# Patient Record
Sex: Male | Born: 1970 | Race: White | Hispanic: No | Smoking: Current every day smoker
Health system: Southern US, Community
[De-identification: ages and names within clinical notes are randomized; demographics above are authoritative.]

## PROBLEM LIST (undated history)

## (undated) DIAGNOSIS — I10 Essential (primary) hypertension: Secondary | ICD-10-CM

## (undated) DIAGNOSIS — F329 Major depressive disorder, single episode, unspecified: Secondary | ICD-10-CM

## (undated) DIAGNOSIS — R51 Headache: Secondary | ICD-10-CM

## (undated) DIAGNOSIS — F319 Bipolar disorder, unspecified: Secondary | ICD-10-CM

## (undated) DIAGNOSIS — J45909 Unspecified asthma, uncomplicated: Secondary | ICD-10-CM

## (undated) DIAGNOSIS — K219 Gastro-esophageal reflux disease without esophagitis: Secondary | ICD-10-CM

## (undated) DIAGNOSIS — G8929 Other chronic pain: Secondary | ICD-10-CM

## (undated) DIAGNOSIS — F419 Anxiety disorder, unspecified: Secondary | ICD-10-CM

## (undated) DIAGNOSIS — N179 Acute kidney failure, unspecified: Secondary | ICD-10-CM

## (undated) DIAGNOSIS — R519 Headache, unspecified: Secondary | ICD-10-CM

## (undated) DIAGNOSIS — M549 Dorsalgia, unspecified: Secondary | ICD-10-CM

## (undated) DIAGNOSIS — N200 Calculus of kidney: Secondary | ICD-10-CM

## (undated) DIAGNOSIS — R569 Unspecified convulsions: Secondary | ICD-10-CM

## (undated) DIAGNOSIS — F32A Depression, unspecified: Secondary | ICD-10-CM

## (undated) DIAGNOSIS — J449 Chronic obstructive pulmonary disease, unspecified: Secondary | ICD-10-CM

## (undated) DIAGNOSIS — J189 Pneumonia, unspecified organism: Secondary | ICD-10-CM

## (undated) HISTORY — PX: ANKLE SURGERY: SHX546

## (undated) HISTORY — PX: APPENDECTOMY: SHX54

## (undated) HISTORY — PX: BACK SURGERY: SHX140

---

## 1998-03-23 ENCOUNTER — Emergency Department (HOSPITAL_COMMUNITY): Admission: EM | Admit: 1998-03-23 | Discharge: 1998-03-23 | Payer: Self-pay | Admitting: Emergency Medicine

## 1998-11-27 ENCOUNTER — Emergency Department (HOSPITAL_COMMUNITY): Admission: EM | Admit: 1998-11-27 | Discharge: 1998-11-27 | Payer: Self-pay | Admitting: Internal Medicine

## 1998-11-28 ENCOUNTER — Encounter: Payer: Self-pay | Admitting: Emergency Medicine

## 1998-11-29 ENCOUNTER — Inpatient Hospital Stay (HOSPITAL_COMMUNITY): Admission: EM | Admit: 1998-11-29 | Discharge: 1998-12-02 | Payer: Self-pay | Admitting: Emergency Medicine

## 1998-11-30 ENCOUNTER — Encounter: Payer: Self-pay | Admitting: Family Medicine

## 1998-12-01 ENCOUNTER — Encounter: Payer: Self-pay | Admitting: Gastroenterology

## 1998-12-03 ENCOUNTER — Inpatient Hospital Stay (HOSPITAL_COMMUNITY): Admission: EM | Admit: 1998-12-03 | Discharge: 1998-12-06 | Payer: Self-pay | Admitting: Emergency Medicine

## 1998-12-04 ENCOUNTER — Encounter: Payer: Self-pay | Admitting: Family Medicine

## 1999-04-13 ENCOUNTER — Emergency Department (HOSPITAL_COMMUNITY): Admission: EM | Admit: 1999-04-13 | Discharge: 1999-04-13 | Payer: Self-pay | Admitting: Emergency Medicine

## 1999-06-13 ENCOUNTER — Emergency Department (HOSPITAL_COMMUNITY): Admission: EM | Admit: 1999-06-13 | Discharge: 1999-06-13 | Payer: Self-pay | Admitting: Emergency Medicine

## 1999-08-21 ENCOUNTER — Encounter: Payer: Self-pay | Admitting: Emergency Medicine

## 1999-08-21 ENCOUNTER — Emergency Department (HOSPITAL_COMMUNITY): Admission: EM | Admit: 1999-08-21 | Discharge: 1999-08-22 | Payer: Self-pay | Admitting: Emergency Medicine

## 1999-09-15 ENCOUNTER — Emergency Department (HOSPITAL_COMMUNITY): Admission: EM | Admit: 1999-09-15 | Discharge: 1999-09-15 | Payer: Self-pay | Admitting: Emergency Medicine

## 1999-10-19 ENCOUNTER — Emergency Department (HOSPITAL_COMMUNITY): Admission: EM | Admit: 1999-10-19 | Discharge: 1999-10-19 | Payer: Self-pay | Admitting: Emergency Medicine

## 1999-10-24 ENCOUNTER — Encounter: Admission: RE | Admit: 1999-10-24 | Discharge: 1999-10-24 | Payer: Self-pay | Admitting: Internal Medicine

## 1999-10-24 ENCOUNTER — Encounter: Payer: Self-pay | Admitting: Internal Medicine

## 1999-10-27 ENCOUNTER — Encounter: Payer: Self-pay | Admitting: Internal Medicine

## 1999-10-27 ENCOUNTER — Encounter: Admission: RE | Admit: 1999-10-27 | Discharge: 1999-10-27 | Payer: Self-pay | Admitting: Internal Medicine

## 1999-11-21 ENCOUNTER — Observation Stay (HOSPITAL_COMMUNITY): Admission: RE | Admit: 1999-11-21 | Discharge: 1999-11-21 | Payer: Self-pay | Admitting: Neurosurgery

## 1999-11-21 ENCOUNTER — Encounter: Payer: Self-pay | Admitting: Neurosurgery

## 1999-11-25 ENCOUNTER — Emergency Department (HOSPITAL_COMMUNITY): Admission: EM | Admit: 1999-11-25 | Discharge: 1999-11-26 | Payer: Self-pay | Admitting: Emergency Medicine

## 1999-11-25 ENCOUNTER — Encounter: Payer: Self-pay | Admitting: Emergency Medicine

## 2000-01-01 ENCOUNTER — Inpatient Hospital Stay (HOSPITAL_COMMUNITY): Admission: EM | Admit: 2000-01-01 | Discharge: 2000-01-03 | Payer: Self-pay | Admitting: *Deleted

## 2000-02-27 ENCOUNTER — Emergency Department (HOSPITAL_COMMUNITY): Admission: EM | Admit: 2000-02-27 | Discharge: 2000-02-27 | Payer: Self-pay | Admitting: Emergency Medicine

## 2000-05-06 ENCOUNTER — Emergency Department (HOSPITAL_COMMUNITY): Admission: EM | Admit: 2000-05-06 | Discharge: 2000-05-06 | Payer: Self-pay | Admitting: *Deleted

## 2000-05-28 ENCOUNTER — Emergency Department (HOSPITAL_COMMUNITY): Admission: EM | Admit: 2000-05-28 | Discharge: 2000-05-28 | Payer: Self-pay | Admitting: Emergency Medicine

## 2000-06-13 ENCOUNTER — Inpatient Hospital Stay (HOSPITAL_COMMUNITY): Admission: EM | Admit: 2000-06-13 | Discharge: 2000-06-15 | Payer: Self-pay | Admitting: Emergency Medicine

## 2000-09-01 ENCOUNTER — Emergency Department (HOSPITAL_COMMUNITY): Admission: EM | Admit: 2000-09-01 | Discharge: 2000-09-01 | Payer: Self-pay | Admitting: Emergency Medicine

## 2000-09-03 ENCOUNTER — Emergency Department (HOSPITAL_COMMUNITY): Admission: EM | Admit: 2000-09-03 | Discharge: 2000-09-03 | Payer: Self-pay | Admitting: Emergency Medicine

## 2000-09-25 ENCOUNTER — Ambulatory Visit (HOSPITAL_COMMUNITY): Admission: RE | Admit: 2000-09-25 | Discharge: 2000-09-25 | Payer: Self-pay | Admitting: *Deleted

## 2000-12-16 ENCOUNTER — Emergency Department (HOSPITAL_COMMUNITY): Admission: EM | Admit: 2000-12-16 | Discharge: 2000-12-16 | Payer: Self-pay | Admitting: Emergency Medicine

## 2000-12-29 ENCOUNTER — Emergency Department (HOSPITAL_COMMUNITY): Admission: EM | Admit: 2000-12-29 | Discharge: 2000-12-29 | Payer: Self-pay | Admitting: Emergency Medicine

## 2001-01-30 ENCOUNTER — Emergency Department (HOSPITAL_COMMUNITY): Admission: EM | Admit: 2001-01-30 | Discharge: 2001-01-30 | Payer: Self-pay | Admitting: Emergency Medicine

## 2001-01-30 ENCOUNTER — Encounter: Payer: Self-pay | Admitting: Emergency Medicine

## 2001-03-17 ENCOUNTER — Emergency Department (HOSPITAL_COMMUNITY): Admission: EM | Admit: 2001-03-17 | Discharge: 2001-03-17 | Payer: Self-pay | Admitting: Emergency Medicine

## 2001-03-17 ENCOUNTER — Encounter: Payer: Self-pay | Admitting: Emergency Medicine

## 2001-04-15 ENCOUNTER — Emergency Department (HOSPITAL_COMMUNITY): Admission: EM | Admit: 2001-04-15 | Discharge: 2001-04-15 | Payer: Self-pay | Admitting: Emergency Medicine

## 2001-08-10 ENCOUNTER — Emergency Department (HOSPITAL_COMMUNITY): Admission: EM | Admit: 2001-08-10 | Discharge: 2001-08-10 | Payer: Self-pay | Admitting: Emergency Medicine

## 2001-09-06 ENCOUNTER — Emergency Department (HOSPITAL_COMMUNITY): Admission: EM | Admit: 2001-09-06 | Discharge: 2001-09-06 | Payer: Self-pay | Admitting: Emergency Medicine

## 2001-09-07 ENCOUNTER — Encounter: Payer: Self-pay | Admitting: Emergency Medicine

## 2001-09-24 ENCOUNTER — Emergency Department (HOSPITAL_COMMUNITY): Admission: EM | Admit: 2001-09-24 | Discharge: 2001-09-24 | Payer: Self-pay | Admitting: Emergency Medicine

## 2001-11-04 ENCOUNTER — Emergency Department (HOSPITAL_COMMUNITY): Admission: EM | Admit: 2001-11-04 | Discharge: 2001-11-04 | Payer: Self-pay | Admitting: Emergency Medicine

## 2002-02-01 ENCOUNTER — Emergency Department (HOSPITAL_COMMUNITY): Admission: EM | Admit: 2002-02-01 | Discharge: 2002-02-01 | Payer: Self-pay | Admitting: Emergency Medicine

## 2002-05-05 ENCOUNTER — Emergency Department (HOSPITAL_COMMUNITY): Admission: EM | Admit: 2002-05-05 | Discharge: 2002-05-05 | Payer: Self-pay | Admitting: Emergency Medicine

## 2002-05-15 ENCOUNTER — Encounter: Payer: Self-pay | Admitting: Emergency Medicine

## 2002-05-15 ENCOUNTER — Emergency Department (HOSPITAL_COMMUNITY): Admission: EM | Admit: 2002-05-15 | Discharge: 2002-05-16 | Payer: Self-pay | Admitting: Emergency Medicine

## 2002-06-04 ENCOUNTER — Emergency Department (HOSPITAL_COMMUNITY): Admission: EM | Admit: 2002-06-04 | Discharge: 2002-06-04 | Payer: Self-pay

## 2002-06-22 ENCOUNTER — Encounter: Payer: Self-pay | Admitting: Neurosurgery

## 2002-06-22 ENCOUNTER — Encounter: Admission: RE | Admit: 2002-06-22 | Discharge: 2002-06-22 | Payer: Self-pay | Admitting: Neurosurgery

## 2002-06-28 ENCOUNTER — Emergency Department (HOSPITAL_COMMUNITY): Admission: EM | Admit: 2002-06-28 | Discharge: 2002-06-29 | Payer: Self-pay | Admitting: Emergency Medicine

## 2002-07-07 ENCOUNTER — Encounter: Admission: RE | Admit: 2002-07-07 | Discharge: 2002-07-07 | Payer: Self-pay | Admitting: Neurosurgery

## 2002-07-07 ENCOUNTER — Encounter: Payer: Self-pay | Admitting: Neurosurgery

## 2002-07-17 ENCOUNTER — Emergency Department (HOSPITAL_COMMUNITY): Admission: EM | Admit: 2002-07-17 | Discharge: 2002-07-17 | Payer: Self-pay | Admitting: Emergency Medicine

## 2002-09-29 ENCOUNTER — Encounter: Admission: RE | Admit: 2002-09-29 | Discharge: 2002-12-28 | Payer: Self-pay

## 2002-11-01 ENCOUNTER — Encounter: Payer: Self-pay | Admitting: Emergency Medicine

## 2002-11-01 ENCOUNTER — Emergency Department (HOSPITAL_COMMUNITY): Admission: EM | Admit: 2002-11-01 | Discharge: 2002-11-01 | Payer: Self-pay | Admitting: Emergency Medicine

## 2002-11-30 ENCOUNTER — Encounter: Admission: RE | Admit: 2002-11-30 | Discharge: 2002-11-30 | Payer: Self-pay | Admitting: Neurology

## 2002-11-30 ENCOUNTER — Encounter: Payer: Self-pay | Admitting: Neurology

## 2002-12-07 ENCOUNTER — Encounter: Payer: Self-pay | Admitting: Emergency Medicine

## 2002-12-07 ENCOUNTER — Emergency Department (HOSPITAL_COMMUNITY): Admission: EM | Admit: 2002-12-07 | Discharge: 2002-12-07 | Payer: Self-pay

## 2003-01-18 ENCOUNTER — Emergency Department (HOSPITAL_COMMUNITY): Admission: EM | Admit: 2003-01-18 | Discharge: 2003-01-18 | Payer: Self-pay | Admitting: Emergency Medicine

## 2003-01-19 ENCOUNTER — Emergency Department (HOSPITAL_COMMUNITY): Admission: EM | Admit: 2003-01-19 | Discharge: 2003-01-19 | Payer: Self-pay | Admitting: Emergency Medicine

## 2003-01-20 ENCOUNTER — Emergency Department (HOSPITAL_COMMUNITY): Admission: EM | Admit: 2003-01-20 | Discharge: 2003-01-20 | Payer: Self-pay | Admitting: Emergency Medicine

## 2003-02-08 ENCOUNTER — Ambulatory Visit (HOSPITAL_COMMUNITY): Admission: RE | Admit: 2003-02-08 | Discharge: 2003-02-08 | Payer: Self-pay | Admitting: Neurology

## 2003-02-08 ENCOUNTER — Encounter: Payer: Self-pay | Admitting: Neurology

## 2003-02-16 ENCOUNTER — Emergency Department (HOSPITAL_COMMUNITY): Admission: EM | Admit: 2003-02-16 | Discharge: 2003-02-16 | Payer: Self-pay | Admitting: Emergency Medicine

## 2003-03-15 ENCOUNTER — Encounter: Payer: Self-pay | Admitting: Emergency Medicine

## 2003-03-15 ENCOUNTER — Emergency Department (HOSPITAL_COMMUNITY): Admission: EM | Admit: 2003-03-15 | Discharge: 2003-03-15 | Payer: Self-pay | Admitting: Emergency Medicine

## 2003-04-11 ENCOUNTER — Emergency Department (HOSPITAL_COMMUNITY): Admission: AD | Admit: 2003-04-11 | Discharge: 2003-04-11 | Payer: Self-pay | Admitting: Emergency Medicine

## 2003-04-14 ENCOUNTER — Emergency Department (HOSPITAL_COMMUNITY): Admission: EM | Admit: 2003-04-14 | Discharge: 2003-04-15 | Payer: Self-pay | Admitting: Emergency Medicine

## 2003-04-23 ENCOUNTER — Ambulatory Visit (HOSPITAL_COMMUNITY): Admission: RE | Admit: 2003-04-23 | Discharge: 2003-04-23 | Payer: Self-pay | Admitting: Emergency Medicine

## 2003-04-23 ENCOUNTER — Encounter: Payer: Self-pay | Admitting: Emergency Medicine

## 2003-05-31 ENCOUNTER — Emergency Department (HOSPITAL_COMMUNITY): Admission: EM | Admit: 2003-05-31 | Discharge: 2003-05-31 | Payer: Self-pay | Admitting: Emergency Medicine

## 2003-05-31 ENCOUNTER — Encounter: Payer: Self-pay | Admitting: Emergency Medicine

## 2003-07-13 ENCOUNTER — Emergency Department (HOSPITAL_COMMUNITY): Admission: AD | Admit: 2003-07-13 | Discharge: 2003-07-13 | Payer: Self-pay | Admitting: Family Medicine

## 2003-07-17 ENCOUNTER — Emergency Department (HOSPITAL_COMMUNITY): Admission: EM | Admit: 2003-07-17 | Discharge: 2003-07-17 | Payer: Self-pay | Admitting: Emergency Medicine

## 2003-08-08 ENCOUNTER — Emergency Department (HOSPITAL_COMMUNITY): Admission: EM | Admit: 2003-08-08 | Discharge: 2003-08-08 | Payer: Self-pay | Admitting: Emergency Medicine

## 2003-08-26 ENCOUNTER — Emergency Department (HOSPITAL_COMMUNITY): Admission: EM | Admit: 2003-08-26 | Discharge: 2003-08-27 | Payer: Self-pay | Admitting: Emergency Medicine

## 2003-09-13 ENCOUNTER — Ambulatory Visit (HOSPITAL_COMMUNITY): Admission: RE | Admit: 2003-09-13 | Discharge: 2003-09-13 | Payer: Self-pay | Admitting: Neurosurgery

## 2003-09-14 ENCOUNTER — Emergency Department (HOSPITAL_COMMUNITY): Admission: EM | Admit: 2003-09-14 | Discharge: 2003-09-14 | Payer: Self-pay | Admitting: *Deleted

## 2003-09-20 ENCOUNTER — Emergency Department (HOSPITAL_COMMUNITY): Admission: EM | Admit: 2003-09-20 | Discharge: 2003-09-20 | Payer: Self-pay | Admitting: Emergency Medicine

## 2003-09-27 ENCOUNTER — Emergency Department (HOSPITAL_COMMUNITY): Admission: EM | Admit: 2003-09-27 | Discharge: 2003-09-27 | Payer: Self-pay | Admitting: Emergency Medicine

## 2003-09-29 ENCOUNTER — Emergency Department (HOSPITAL_COMMUNITY): Admission: EM | Admit: 2003-09-29 | Discharge: 2003-09-29 | Payer: Self-pay | Admitting: Emergency Medicine

## 2003-10-03 ENCOUNTER — Emergency Department (HOSPITAL_COMMUNITY): Admission: EM | Admit: 2003-10-03 | Discharge: 2003-10-03 | Payer: Self-pay | Admitting: Emergency Medicine

## 2003-10-30 ENCOUNTER — Emergency Department (HOSPITAL_COMMUNITY): Admission: EM | Admit: 2003-10-30 | Discharge: 2003-10-30 | Payer: Self-pay | Admitting: Emergency Medicine

## 2003-11-25 ENCOUNTER — Emergency Department (HOSPITAL_COMMUNITY): Admission: EM | Admit: 2003-11-25 | Discharge: 2003-11-25 | Payer: Self-pay | Admitting: Emergency Medicine

## 2003-11-26 ENCOUNTER — Emergency Department (HOSPITAL_COMMUNITY): Admission: EM | Admit: 2003-11-26 | Discharge: 2003-11-26 | Payer: Self-pay | Admitting: Emergency Medicine

## 2003-11-26 ENCOUNTER — Inpatient Hospital Stay (HOSPITAL_COMMUNITY): Admission: AD | Admit: 2003-11-26 | Discharge: 2003-11-29 | Payer: Self-pay | Admitting: Psychiatry

## 2003-12-04 ENCOUNTER — Emergency Department (HOSPITAL_COMMUNITY): Admission: EM | Admit: 2003-12-04 | Discharge: 2003-12-04 | Payer: Self-pay | Admitting: Emergency Medicine

## 2003-12-06 ENCOUNTER — Emergency Department (HOSPITAL_COMMUNITY): Admission: EM | Admit: 2003-12-06 | Discharge: 2003-12-06 | Payer: Self-pay | Admitting: Emergency Medicine

## 2003-12-07 ENCOUNTER — Encounter
Admission: RE | Admit: 2003-12-07 | Discharge: 2004-03-06 | Payer: Self-pay | Admitting: Physical Medicine & Rehabilitation

## 2004-01-21 ENCOUNTER — Emergency Department (HOSPITAL_COMMUNITY): Admission: EM | Admit: 2004-01-21 | Discharge: 2004-01-21 | Payer: Self-pay | Admitting: Emergency Medicine

## 2004-01-22 ENCOUNTER — Emergency Department (HOSPITAL_COMMUNITY): Admission: EM | Admit: 2004-01-22 | Discharge: 2004-01-22 | Payer: Self-pay | Admitting: Emergency Medicine

## 2004-01-25 ENCOUNTER — Emergency Department (HOSPITAL_COMMUNITY): Admission: EM | Admit: 2004-01-25 | Discharge: 2004-01-25 | Payer: Self-pay | Admitting: Emergency Medicine

## 2004-03-15 ENCOUNTER — Emergency Department (HOSPITAL_COMMUNITY): Admission: EM | Admit: 2004-03-15 | Discharge: 2004-03-15 | Payer: Self-pay | Admitting: Emergency Medicine

## 2004-10-30 ENCOUNTER — Emergency Department (HOSPITAL_COMMUNITY): Admission: EM | Admit: 2004-10-30 | Discharge: 2004-10-30 | Payer: Self-pay | Admitting: Family Medicine

## 2004-10-31 ENCOUNTER — Emergency Department (HOSPITAL_COMMUNITY): Admission: EM | Admit: 2004-10-31 | Discharge: 2004-10-31 | Payer: Self-pay | Admitting: Emergency Medicine

## 2005-07-06 ENCOUNTER — Emergency Department (HOSPITAL_COMMUNITY): Admission: EM | Admit: 2005-07-06 | Discharge: 2005-07-06 | Payer: Self-pay | Admitting: Family Medicine

## 2005-10-14 ENCOUNTER — Emergency Department (HOSPITAL_COMMUNITY): Admission: EM | Admit: 2005-10-14 | Discharge: 2005-10-14 | Payer: Self-pay | Admitting: Family Medicine

## 2005-10-25 ENCOUNTER — Ambulatory Visit: Payer: Self-pay

## 2005-12-20 ENCOUNTER — Emergency Department (HOSPITAL_COMMUNITY): Admission: EM | Admit: 2005-12-20 | Discharge: 2005-12-20 | Payer: Self-pay | Admitting: Emergency Medicine

## 2006-01-18 ENCOUNTER — Ambulatory Visit: Payer: Self-pay | Admitting: Internal Medicine

## 2006-01-18 ENCOUNTER — Inpatient Hospital Stay (HOSPITAL_COMMUNITY): Admission: EM | Admit: 2006-01-18 | Discharge: 2006-01-21 | Payer: Self-pay | Admitting: Emergency Medicine

## 2006-02-09 ENCOUNTER — Emergency Department (HOSPITAL_COMMUNITY): Admission: EM | Admit: 2006-02-09 | Discharge: 2006-02-09 | Payer: Self-pay | Admitting: Family Medicine

## 2006-02-21 ENCOUNTER — Emergency Department (HOSPITAL_COMMUNITY): Admission: EM | Admit: 2006-02-21 | Discharge: 2006-02-21 | Payer: Self-pay | Admitting: Family Medicine

## 2006-03-06 ENCOUNTER — Emergency Department (HOSPITAL_COMMUNITY): Admission: EM | Admit: 2006-03-06 | Discharge: 2006-03-06 | Payer: Self-pay | Admitting: Emergency Medicine

## 2006-06-16 ENCOUNTER — Encounter: Admission: RE | Admit: 2006-06-16 | Discharge: 2006-06-16 | Payer: Self-pay | Admitting: Oncology

## 2006-10-06 ENCOUNTER — Observation Stay (HOSPITAL_COMMUNITY): Admission: EM | Admit: 2006-10-06 | Discharge: 2006-10-06 | Payer: Self-pay | Admitting: Emergency Medicine

## 2007-01-22 ENCOUNTER — Emergency Department (HOSPITAL_COMMUNITY): Admission: EM | Admit: 2007-01-22 | Discharge: 2007-01-22 | Payer: Self-pay | Admitting: Emergency Medicine

## 2007-02-28 ENCOUNTER — Inpatient Hospital Stay (HOSPITAL_COMMUNITY): Admission: RE | Admit: 2007-02-28 | Discharge: 2007-03-01 | Payer: Self-pay | Admitting: Psychiatry

## 2007-02-28 ENCOUNTER — Ambulatory Visit: Payer: Self-pay | Admitting: Psychiatry

## 2007-03-24 ENCOUNTER — Ambulatory Visit (HOSPITAL_COMMUNITY): Admission: RE | Admit: 2007-03-24 | Discharge: 2007-03-24 | Payer: Self-pay | Admitting: Gastroenterology

## 2007-05-05 ENCOUNTER — Inpatient Hospital Stay (HOSPITAL_COMMUNITY): Admission: RE | Admit: 2007-05-05 | Discharge: 2007-05-09 | Payer: Self-pay | Admitting: Psychiatry

## 2007-05-05 ENCOUNTER — Ambulatory Visit: Payer: Self-pay | Admitting: Psychiatry

## 2007-07-20 ENCOUNTER — Emergency Department (HOSPITAL_COMMUNITY): Admission: EM | Admit: 2007-07-20 | Discharge: 2007-07-20 | Payer: Self-pay | Admitting: Emergency Medicine

## 2007-08-23 ENCOUNTER — Inpatient Hospital Stay (HOSPITAL_COMMUNITY): Admission: EM | Admit: 2007-08-23 | Discharge: 2007-08-26 | Payer: Self-pay | Admitting: *Deleted

## 2007-08-23 ENCOUNTER — Ambulatory Visit: Payer: Self-pay | Admitting: Internal Medicine

## 2007-08-26 ENCOUNTER — Encounter (INDEPENDENT_AMBULATORY_CARE_PROVIDER_SITE_OTHER): Payer: Self-pay | Admitting: *Deleted

## 2007-09-15 ENCOUNTER — Ambulatory Visit: Payer: Self-pay | Admitting: Internal Medicine

## 2007-09-15 ENCOUNTER — Encounter (INDEPENDENT_AMBULATORY_CARE_PROVIDER_SITE_OTHER): Payer: Self-pay | Admitting: *Deleted

## 2007-09-15 DIAGNOSIS — Z8669 Personal history of other diseases of the nervous system and sense organs: Secondary | ICD-10-CM

## 2007-09-15 DIAGNOSIS — F329 Major depressive disorder, single episode, unspecified: Secondary | ICD-10-CM

## 2007-09-15 DIAGNOSIS — M545 Low back pain: Secondary | ICD-10-CM

## 2007-09-15 DIAGNOSIS — Z9189 Other specified personal risk factors, not elsewhere classified: Secondary | ICD-10-CM | POA: Insufficient documentation

## 2007-09-15 DIAGNOSIS — F410 Panic disorder [episodic paroxysmal anxiety] without agoraphobia: Secondary | ICD-10-CM

## 2007-09-15 DIAGNOSIS — S2239XA Fracture of one rib, unspecified side, initial encounter for closed fracture: Secondary | ICD-10-CM

## 2007-09-29 ENCOUNTER — Telehealth (INDEPENDENT_AMBULATORY_CARE_PROVIDER_SITE_OTHER): Payer: Self-pay | Admitting: *Deleted

## 2007-10-14 ENCOUNTER — Telehealth (INDEPENDENT_AMBULATORY_CARE_PROVIDER_SITE_OTHER): Payer: Self-pay | Admitting: *Deleted

## 2007-10-14 ENCOUNTER — Encounter: Payer: Self-pay | Admitting: Internal Medicine

## 2007-10-27 ENCOUNTER — Encounter: Payer: Self-pay | Admitting: Internal Medicine

## 2007-10-27 ENCOUNTER — Ambulatory Visit: Payer: Self-pay

## 2007-11-23 ENCOUNTER — Emergency Department (HOSPITAL_COMMUNITY): Admission: EM | Admit: 2007-11-23 | Discharge: 2007-11-23 | Payer: Self-pay | Admitting: Family Medicine

## 2007-11-29 ENCOUNTER — Emergency Department (HOSPITAL_COMMUNITY): Admission: EM | Admit: 2007-11-29 | Discharge: 2007-11-29 | Payer: Self-pay | Admitting: Emergency Medicine

## 2007-12-27 ENCOUNTER — Emergency Department (HOSPITAL_COMMUNITY): Admission: EM | Admit: 2007-12-27 | Discharge: 2007-12-27 | Payer: Self-pay | Admitting: Emergency Medicine

## 2008-02-07 ENCOUNTER — Emergency Department (HOSPITAL_COMMUNITY): Admission: EM | Admit: 2008-02-07 | Discharge: 2008-02-07 | Payer: Self-pay | Admitting: Emergency Medicine

## 2008-04-07 ENCOUNTER — Emergency Department (HOSPITAL_COMMUNITY): Admission: EM | Admit: 2008-04-07 | Discharge: 2008-04-07 | Payer: Self-pay | Admitting: Emergency Medicine

## 2008-04-27 ENCOUNTER — Emergency Department (HOSPITAL_COMMUNITY): Admission: EM | Admit: 2008-04-27 | Discharge: 2008-04-27 | Payer: Self-pay | Admitting: Emergency Medicine

## 2008-05-03 ENCOUNTER — Emergency Department (HOSPITAL_COMMUNITY): Admission: EM | Admit: 2008-05-03 | Discharge: 2008-05-03 | Payer: Self-pay | Admitting: Emergency Medicine

## 2008-05-27 ENCOUNTER — Emergency Department (HOSPITAL_COMMUNITY): Admission: EM | Admit: 2008-05-27 | Discharge: 2008-05-27 | Payer: Self-pay | Admitting: Emergency Medicine

## 2008-06-01 ENCOUNTER — Ambulatory Visit (HOSPITAL_COMMUNITY): Admission: RE | Admit: 2008-06-01 | Discharge: 2008-06-01 | Payer: Self-pay | Admitting: Emergency Medicine

## 2008-06-08 ENCOUNTER — Emergency Department (HOSPITAL_COMMUNITY): Admission: EM | Admit: 2008-06-08 | Discharge: 2008-06-08 | Payer: Self-pay | Admitting: Emergency Medicine

## 2008-06-11 ENCOUNTER — Emergency Department (HOSPITAL_COMMUNITY): Admission: EM | Admit: 2008-06-11 | Discharge: 2008-06-11 | Payer: Self-pay | Admitting: Emergency Medicine

## 2008-11-24 ENCOUNTER — Emergency Department (HOSPITAL_COMMUNITY): Admission: EM | Admit: 2008-11-24 | Discharge: 2008-11-24 | Payer: Self-pay | Admitting: Emergency Medicine

## 2009-04-14 ENCOUNTER — Emergency Department (HOSPITAL_COMMUNITY): Admission: EM | Admit: 2009-04-14 | Discharge: 2009-04-14 | Payer: Self-pay | Admitting: Emergency Medicine

## 2009-04-15 ENCOUNTER — Emergency Department (HOSPITAL_COMMUNITY): Admission: EM | Admit: 2009-04-15 | Discharge: 2009-04-15 | Payer: Self-pay | Admitting: Emergency Medicine

## 2009-04-17 ENCOUNTER — Emergency Department (HOSPITAL_COMMUNITY): Admission: EM | Admit: 2009-04-17 | Discharge: 2009-04-17 | Payer: Self-pay | Admitting: Emergency Medicine

## 2009-04-21 ENCOUNTER — Emergency Department (HOSPITAL_COMMUNITY): Admission: EM | Admit: 2009-04-21 | Discharge: 2009-04-21 | Payer: Self-pay | Admitting: Emergency Medicine

## 2009-05-02 ENCOUNTER — Emergency Department (HOSPITAL_COMMUNITY): Admission: EM | Admit: 2009-05-02 | Discharge: 2009-05-02 | Payer: Self-pay | Admitting: Emergency Medicine

## 2009-05-30 ENCOUNTER — Encounter: Admission: RE | Admit: 2009-05-30 | Discharge: 2009-05-30 | Payer: Self-pay | Admitting: Family Medicine

## 2009-05-31 ENCOUNTER — Emergency Department (HOSPITAL_COMMUNITY): Admission: EM | Admit: 2009-05-31 | Discharge: 2009-05-31 | Payer: Self-pay | Admitting: Emergency Medicine

## 2009-06-03 ENCOUNTER — Emergency Department (HOSPITAL_COMMUNITY): Admission: EM | Admit: 2009-06-03 | Discharge: 2009-06-03 | Payer: Self-pay | Admitting: Emergency Medicine

## 2009-09-30 ENCOUNTER — Emergency Department (HOSPITAL_COMMUNITY): Admission: EM | Admit: 2009-09-30 | Discharge: 2009-09-30 | Payer: Self-pay | Admitting: Emergency Medicine

## 2009-11-01 ENCOUNTER — Emergency Department (HOSPITAL_COMMUNITY): Admission: EM | Admit: 2009-11-01 | Discharge: 2009-11-01 | Payer: Self-pay | Admitting: Emergency Medicine

## 2010-01-15 ENCOUNTER — Emergency Department (HOSPITAL_COMMUNITY): Admission: EM | Admit: 2010-01-15 | Discharge: 2010-01-15 | Payer: Self-pay | Admitting: Emergency Medicine

## 2010-03-15 ENCOUNTER — Emergency Department (HOSPITAL_COMMUNITY): Admission: EM | Admit: 2010-03-15 | Discharge: 2010-03-15 | Payer: Self-pay | Admitting: Emergency Medicine

## 2010-05-01 ENCOUNTER — Emergency Department (HOSPITAL_COMMUNITY): Admission: EM | Admit: 2010-05-01 | Discharge: 2010-05-01 | Payer: Self-pay | Admitting: Emergency Medicine

## 2010-05-26 ENCOUNTER — Emergency Department (HOSPITAL_COMMUNITY): Admission: EM | Admit: 2010-05-26 | Discharge: 2010-05-26 | Payer: Self-pay | Admitting: Emergency Medicine

## 2010-05-27 ENCOUNTER — Other Ambulatory Visit: Payer: Self-pay | Admitting: Emergency Medicine

## 2010-05-27 ENCOUNTER — Ambulatory Visit: Payer: Self-pay | Admitting: Psychiatry

## 2010-05-27 ENCOUNTER — Inpatient Hospital Stay (HOSPITAL_COMMUNITY): Admission: RE | Admit: 2010-05-27 | Discharge: 2010-05-31 | Payer: Self-pay | Admitting: Psychiatry

## 2010-09-10 ENCOUNTER — Encounter: Payer: Self-pay | Admitting: Gastroenterology

## 2010-09-16 ENCOUNTER — Emergency Department (HOSPITAL_COMMUNITY)
Admission: EM | Admit: 2010-09-16 | Discharge: 2010-09-16 | Payer: Self-pay | Source: Home / Self Care | Admitting: Emergency Medicine

## 2010-09-16 LAB — URINALYSIS, ROUTINE W REFLEX MICROSCOPIC
Bilirubin Urine: NEGATIVE
Hgb urine dipstick: NEGATIVE
Ketones, ur: NEGATIVE mg/dL
Protein, ur: NEGATIVE mg/dL
Urobilinogen, UA: 0.2 mg/dL (ref 0.0–1.0)

## 2010-09-17 ENCOUNTER — Emergency Department (HOSPITAL_COMMUNITY)
Admission: EM | Admit: 2010-09-17 | Discharge: 2010-09-17 | Payer: Self-pay | Source: Home / Self Care | Admitting: Emergency Medicine

## 2010-09-17 LAB — CBC
Platelets: 232 10*3/uL (ref 150–400)
RDW: 13.1 % (ref 11.5–15.5)
WBC: 19.2 10*3/uL — ABNORMAL HIGH (ref 4.0–10.5)

## 2010-09-17 LAB — POCT I-STAT, CHEM 8
Calcium, Ion: 1.13 mmol/L (ref 1.12–1.32)
Glucose, Bld: 74 mg/dL (ref 70–99)
HCT: 42 % (ref 39.0–52.0)
Hemoglobin: 14.3 g/dL (ref 13.0–17.0)
Potassium: 3.9 mEq/L (ref 3.5–5.1)

## 2010-09-17 LAB — DIFFERENTIAL
Basophils Absolute: 0.1 10*3/uL (ref 0.0–0.1)
Eosinophils Absolute: 0.2 10*3/uL (ref 0.0–0.7)
Eosinophils Relative: 1 % (ref 0–5)
Lymphocytes Relative: 8 % — ABNORMAL LOW (ref 12–46)

## 2010-09-17 LAB — URINALYSIS, ROUTINE W REFLEX MICROSCOPIC
Bilirubin Urine: NEGATIVE
Nitrite: NEGATIVE
Specific Gravity, Urine: 1.025 (ref 1.005–1.030)
Urobilinogen, UA: 0.2 mg/dL (ref 0.0–1.0)

## 2010-09-17 LAB — URINE CULTURE: Culture: NO GROWTH

## 2010-09-18 ENCOUNTER — Observation Stay (HOSPITAL_COMMUNITY)
Admission: EM | Admit: 2010-09-18 | Discharge: 2010-09-19 | Disposition: A | Discharge status: Home / Self Care | Payer: Self-pay | Source: Home / Self Care | Attending: Internal Medicine | Admitting: Internal Medicine

## 2010-09-18 LAB — DIFFERENTIAL
Basophils Relative: 0 % (ref 0–1)
Lymphs Abs: 2.8 10*3/uL (ref 0.7–4.0)
Monocytes Relative: 7 % (ref 3–12)
Neutro Abs: 10 10*3/uL — ABNORMAL HIGH (ref 1.7–7.7)
Neutrophils Relative %: 71 % (ref 43–77)

## 2010-09-18 LAB — URINALYSIS, ROUTINE W REFLEX MICROSCOPIC
Bilirubin Urine: NEGATIVE
Ketones, ur: NEGATIVE mg/dL
Nitrite: NEGATIVE
Urobilinogen, UA: 0.2 mg/dL (ref 0.0–1.0)

## 2010-09-18 LAB — RAPID URINE DRUG SCREEN, HOSP PERFORMED
Amphetamines: NOT DETECTED
Barbiturates: NOT DETECTED
Benzodiazepines: POSITIVE — AB
Cocaine: NOT DETECTED

## 2010-09-18 LAB — COMPREHENSIVE METABOLIC PANEL
BUN: 10 mg/dL (ref 6–23)
Calcium: 8.8 mg/dL (ref 8.4–10.5)
Creatinine, Ser: 1.03 mg/dL (ref 0.4–1.5)
GFR calc non Af Amer: >60 mL/min (ref >60)
Glucose, Bld: 88 mg/dL (ref 70–99)
Sodium: 138 mEq/L (ref 135–145)
Total Protein: 6.7 g/dL (ref 6.0–8.3)

## 2010-09-18 LAB — ETHANOL: Alcohol, Ethyl (B): <5 mg/dL (ref 0–10)

## 2010-09-18 LAB — CBC
Hemoglobin: 12.6 g/dL — ABNORMAL LOW (ref 13.0–17.0)
MCH: 32.6 pg (ref 26.0–34.0)
RBC: 3.87 MIL/uL — ABNORMAL LOW (ref 4.22–5.81)
WBC: 14 10*3/uL — ABNORMAL HIGH (ref 4.0–10.5)

## 2010-09-18 LAB — URINE MICROSCOPIC-ADD ON

## 2010-09-19 LAB — URINE CULTURE
Colony Count: NO GROWTH
Culture  Setup Time: 201201300119

## 2010-09-27 NOTE — Discharge Summary (Signed)
NAME:  Eddie Williams, Eddie Williams               ACCOUNT NO.:  [id number]  MEDICAL RECORD NO.:  [id number]          PATIENT TYPE:  OBV  LOCATION:  1502                         FACILITY:  [facility]  PHYSICIAN:  Clydia Llano, MD       DATE OF BIRTH:  1971-04-18  DATE OF ADMISSION:  09/18/2010 DATE OF DISCHARGE:  09/19/2010                              DISCHARGE SUMMARY   PRIMARY CARE PHYSICIAN:  Dr. Lerry Liner.  The patient stayed in the hospital as observation.  REASON FOR ADMISSION:  Altered mental status.  DISCHARGE DIAGNOSES: 1. Altered mental status secondary to opioid overdose. 2. Accidental opioid overdose. 3. Urinary retention. 4. Hypertension. 5. Anxiety disorder. 6. Chronic pain. 7. Degenerative disk disease. 8. Depression. 9. Previous history of seizure secondary to benzodiazepine withdrawal. 10.History of tricyclic antidepressant overdose in 2001. 11.History of opioid overdose in 2005.  DISCHARGE MEDICATIONS: 1. Atenolol/chlorthalidone 50/25 mg p.o. daily. 2. Klonopin 1 mg p.o. t.i.d. 3. Lortab 10/500 mg 1 tablet every 6 hours as needed for pain. 4. Promethazine 25 mg every 6 hours as needed. 5. Soma 350 mg q.8h. 6. Tramadol 50 mg p.o. every 8 hours as needed for pain. 7. Vitamin B12 injection 1 tablet every month.  RADIOLOGY: 1. Chest x-ray showed no acute cardiopulmonary process. 2. CT scan of the head without contrast showed no acute findings.     There is nasal sinus disease.  BRIEF HISTORY AND EXAMINATION:  Eddie Williams is a 40 year old Caucasian male with history of chronic narcotic use secondary to chronic back pain.  The patient came into the hospital because of altered mental status.  The patient came in a day before to the emergency department complaining about urinary retention that he stated that it is probably secondary to Pristiq.  The patient was discharged home with Foley catheter to follow up with urology.  The patient said he was hurting so bad from  the Foley catheter that he took 2 of his Vicodin at nighttime and during the morning instead of one.  He was drowsy and sleepy.  The patient was brought to the emergency department afterwards for further eval.  The patient was drowsy at the time of evaluation by emergency department physician, that is why I saw him.  When I saw him around 11:30, the patient was getting a little bit awake and alert but still very short attention span.  The patient admitted for observation overnight. 1. Altered mental status secondary to opioid overdose.  This is     probably accidental overdose.  The patient was simply trying to     control the pain by getting a lot of pain medications.  The patient     did not require psych consultation because in my assessment, I     think he was not suicidal even though he took too much pain     medications.  The patient got fully awake and alert in the morning.     He is eating, walking around.  Discharged home to follow up with     his primary care physician. 2. Urinary retention.  As mentioned above, the patient  came into the     emergency department on September 15, 2010, with urinary retention.     The patient had straight cath, discharged home, came back 1 day     later because still he could not void.  The patient was discharged     home on Foley catheter and to follow up with outpatient urology,     with Dr. Su Grand.  The patient will be discharged with the Foley     catheter.  The patient said he had history of urinary retention     before when he was started on citalopram which is not common to     cause urinary attention.  The patient is to follow up with urology     and primary care physician. 3. Questionable aspiration.  Because of the patient's altered mental     status at the time of admission, evaluation by portable x-ray     showed questionable right lower lobe infiltrates versus aspiration.     I repeated the chest x-ray with PA and lateral scans and  showed no     evidence of cardiopulmonary disease. 4. Hypertension.  Continue home medications, rate controlled.  DISCHARGE INSTRUCTIONS: 1. Diet; regular diet. 2. Disposition home. 3. Activity as tolerated.     Clydia Llano, MD     ME/MEDQ  D:  09/19/2010  T:  09/19/2010  Job:  [id number]  cc:   Dr. Lerry Liner  Electronically Signed by Clydia Llano  on 09/27/2010 06:50:00 PM

## 2010-09-27 NOTE — H&P (Signed)
NAME:  Eddie Williams, Eddie Williams NO.:  [id number]  MEDICAL RECORD NO.:  [id number]          PATIENT TYPE:  EMS  LOCATION:  ED                           FACILITY:  [facility]  PHYSICIAN:  Clydia Llano, MD       DATE OF BIRTH:  11-18-1970  DATE OF ADMISSION:  09/18/2010 DATE OF DISCHARGE:                             HISTORY & PHYSICAL   PRIMARY CARE PHYSICIAN:  Dr. Lerry Liner.  REASON FOR ADMISSION:  Altered mental status.  HISTORY OF PRESENT ILLNESS:  Eddie Williams is a 40 year old Caucasian male with history of chronic narcotics use.  The patient has history of seizure from withdrawal from benzodiazepines before.  Patient presented to the emergency department with drowsiness, sleepiness.  Patient history started 3 days ago when he came into the emergency department complaining about urinary retention.  Patient stated that he was started on Pristiq recently and that caused him urinary retention.  Patient said he had the same problem with citalopram a year ago with urinary retention.  Patient was told to stop the new antidepressant and was sent home.  Patient did not void, so came back to the emergency department yesterday.  Foley catheter was placed and patient was discharged home to follow up with Dr. Ezzie Dural as outpatient.  So patient went home and he said he was hurting so bad from the Foley catheter, so he took two Vicodin instead one.  Patient is taking 10/500 mg of Vicodin every 6 hours, so he took two tablets instead of one last night and two tablets instead of one this morning and he was found very sleepy and drowsy this morning by his significant other.  EMS was called and he was brought to the emergency department because of this.  Upon initial evaluation in the emergency department, patient was found very drowsy, could not communicate well.  When I saw him (I saw him around 11:30), patient is getting awake and alert.  I will admit him for observation until  the medication effect wears off.  PAST MEDICAL HISTORY: 1. Hypertension. 2. Anxiety. 3. Chronic pain. 4. Degenerative disk disease. 5. Depression. 6. Previous history of seizures secondary to benzodiazepine     withdrawal. 7. History of tricyclic antidepressant overdose in 2001. 8. History of pain medication overdose in 2005.  ALLERGIES: 1. PENICILLINS. 2. ERYTHROMYCIN. 3. NSAIDS.  MEDICATIONS: 1. Pristiq 50 mg p.o. daily. 2. Lortab 10 mg/500 mg every 6 hours. 3. Klonopin 1 mg p.o. t.i.d. 4. Soma 350 mg 4 times daily. 5. Atenolol 50 mg p.o. daily. 6. Moxifloxacin.  REVIEW OF SYSTEMS:  The patient is sleepy, drowsy.  Cannot assess.  PHYSICAL EXAMINATION:  VITAL SIGNS:  Temperature is 98.0, respirations 18, pulse is 65, blood pressure is 98/65. GENERAL:  The patient is well developed, well nourished, well hydrated, in no acute distress. HEAD AND FACE:  Normocephalic, atraumatic. EYES:  Pupils equal and reactive to light and accommodation.  The left pupil is sluggish but reactive. ENT:  Ears, nose, and throat normal. NECK:  Supple.  No lymphadenopathy. CARDIOVASCULAR:  Regular rate and rhythm.  No murmurs, rubs, or gallops.  RESPIRATORY:  Breath sounds clear to auscultation bilaterally. CHEST:  Nontender or distended. ABDOMEN:  Bowel sounds heard.  Soft, nontender, nondistended. GU:  Foley cath and leg bag in place. EXTREMITIES:  No tenderness or edema. NEURO:  Patient drowsy but easy arousable.  Short attention span.  Motor intact in all extremities.  No slurred speech, but very drowsy. SKIN:  Color normal.  No rash.  No petechiae.  Warm and dry. LYMPH NODES:  No palpable tender lymph nodes.  EKG:  Normal sinus rhythm with normal ST waves.  LABORATORY DATA: 1. BMP:  Sodium 138, potassium 3.7, chloride 101, bicarb is 28,     glucose 88, BUN is 10, creatinine is 1.0, and calcium 8.8. 2. LFTs:  Albumin is 3.8, AST is 16, ALT 12, alk phos is 60, total     bili is  0.79. 3. CBC:  WBCs 14.0, hemoglobin 12.6, hematocrit is 35.8, and platelets     206.  RADIOLOGY: 1. CT scan showed no acute intracranial findings, paranasal sinuses     disease. 2. Chest x-ray, portable, showed bronchitic changes, questionable     right lower lobe infiltrates versus aspiration.  UA showed trace leukocyte esterase with bacteriuria and 3-6 WBCs in the urine.  ASSESSMENT/PLAN: 1. Opioid overdose.  Probably accidental, in spite of the patient's     multiple history of overdose.  I think this time it is accidental,     appears it was simply hurting from his Foley catheter.  I will     refer the patient to observation overnight.  Continue all of his     medication.  I do not think he is going to need psychiatry     evaluation. 2. Urinary retention.  The patient to follow up with Dr. Brunilda Payor as     outpatient.  I will keep the Foley in. 3. Questionable aspiration.  The patient is started on Avelox already     at the emergency department.  Will continue on that.  Will obtain a     posteroanterior and lateral chest x-ray to assess the need for the     antibiotics. 4. Questionable urinary tract infection.  The patient's urinalysis was     normal until yesterday, then he had catheter put in yesterday.  His     urine showed trace leukocyte esterase with WBCs of 3-6 and some     RBCs 2-6.  I will     culture the urine, and patient anyway will be on Avelox for his     aspiration.  I do not think he has urinary tract infection, but     will follow up on the cultures. 5. Hypertension.  Continue home medication.     Clydia Llano, MD     ME/MEDQ  D:  09/18/2010  T:  09/18/2010  Job:  [id number]  cc:   Dr. Lerry Liner  Electronically Signed by Clydia Llano  on 09/27/2010 06:50:13 PM

## 2010-10-05 LAB — BLOOD GAS, ARTERIAL
Patient temperature: 98.6
pH, Arterial: 7.459 — ABNORMAL HIGH (ref 7.350–7.450)

## 2010-10-06 ENCOUNTER — Other Ambulatory Visit: Payer: Self-pay | Admitting: Specialist

## 2010-10-06 ENCOUNTER — Ambulatory Visit
Admission: RE | Admit: 2010-10-06 | Discharge: 2010-10-06 | Disposition: A | Discharge status: Home / Self Care | Payer: Medicare Other | Source: Ambulatory Visit | Attending: Specialist | Admitting: Specialist

## 2010-10-06 DIAGNOSIS — R05 Cough: Secondary | ICD-10-CM

## 2010-10-14 ENCOUNTER — Ambulatory Visit (HOSPITAL_COMMUNITY)
Admission: RE | Admit: 2010-10-14 | Discharge: 2010-10-14 | Disposition: A | Discharge status: Home / Self Care | Payer: Medicare Other | Source: Ambulatory Visit | Attending: Psychiatry | Admitting: Psychiatry

## 2010-10-14 DIAGNOSIS — F39 Unspecified mood [affective] disorder: Secondary | ICD-10-CM | POA: Insufficient documentation

## 2010-10-20 ENCOUNTER — Emergency Department (HOSPITAL_COMMUNITY)
Admission: EM | Admit: 2010-10-20 | Discharge: 2010-10-20 | Disposition: A | Discharge status: Home / Self Care | Payer: Medicare Other | Attending: Emergency Medicine | Admitting: Emergency Medicine

## 2010-10-20 DIAGNOSIS — I10 Essential (primary) hypertension: Secondary | ICD-10-CM | POA: Insufficient documentation

## 2010-10-20 DIAGNOSIS — F411 Generalized anxiety disorder: Secondary | ICD-10-CM | POA: Insufficient documentation

## 2010-10-20 DIAGNOSIS — IMO0002 Reserved for concepts with insufficient information to code with codable children: Secondary | ICD-10-CM | POA: Insufficient documentation

## 2010-10-20 DIAGNOSIS — Z79899 Other long term (current) drug therapy: Secondary | ICD-10-CM | POA: Insufficient documentation

## 2010-10-20 DIAGNOSIS — R569 Unspecified convulsions: Secondary | ICD-10-CM | POA: Insufficient documentation

## 2010-10-20 DIAGNOSIS — E876 Hypokalemia: Secondary | ICD-10-CM | POA: Insufficient documentation

## 2010-10-20 DIAGNOSIS — F341 Dysthymic disorder: Secondary | ICD-10-CM | POA: Insufficient documentation

## 2010-10-20 LAB — DIFFERENTIAL
Basophils Absolute: 0 10*3/uL (ref 0.0–0.1)
Basophils Relative: 0 % (ref 0–1)
Eosinophils Absolute: 0.2 10*3/uL (ref 0.0–0.7)
Eosinophils Relative: 2 % (ref 0–5)
Lymphs Abs: 2.9 10*3/uL (ref 0.7–4.0)
Neutrophils Relative %: 62 % (ref 43–77)

## 2010-10-20 LAB — URINALYSIS, ROUTINE W REFLEX MICROSCOPIC
Bilirubin Urine: NEGATIVE
Ketones, ur: NEGATIVE mg/dL
Leukocytes, UA: NEGATIVE
Nitrite: NEGATIVE
Specific Gravity, Urine: 1.017 (ref 1.005–1.030)
Urobilinogen, UA: 0.2 mg/dL (ref 0.0–1.0)

## 2010-10-20 LAB — CBC
MCV: 92.2 fL (ref 78.0–100.0)
Platelets: 300 10*3/uL (ref 150–400)
RBC: 3.97 MIL/uL — ABNORMAL LOW (ref 4.22–5.81)
RDW: 13.9 % (ref 11.5–15.5)
WBC: 9.9 10*3/uL (ref 4.0–10.5)

## 2010-10-20 LAB — RAPID URINE DRUG SCREEN, HOSP PERFORMED
Amphetamines: NOT DETECTED
Cocaine: NOT DETECTED
Opiates: POSITIVE — AB
Tetrahydrocannabinol: NOT DETECTED

## 2010-10-20 LAB — ETHANOL: Alcohol, Ethyl (B): <5 mg/dL (ref 0–10)

## 2010-10-20 LAB — BASIC METABOLIC PANEL
BUN: 8 mg/dL (ref 6–23)
Calcium: 9.1 mg/dL (ref 8.4–10.5)
GFR calc non Af Amer: >60 mL/min (ref >60)
Glucose, Bld: 118 mg/dL — ABNORMAL HIGH (ref 70–99)
Sodium: 135 mEq/L (ref 135–145)

## 2010-11-02 LAB — URINALYSIS, ROUTINE W REFLEX MICROSCOPIC
Bilirubin Urine: NEGATIVE
Glucose, UA: NEGATIVE mg/dL
Hgb urine dipstick: NEGATIVE
Ketones, ur: NEGATIVE mg/dL
Nitrite: NEGATIVE
Protein, ur: NEGATIVE mg/dL
Specific Gravity, Urine: 1.008 (ref 1.005–1.030)
Urobilinogen, UA: 0.2 mg/dL (ref 0.0–1.0)
pH: 7 (ref 5.0–8.0)

## 2010-11-02 LAB — RAPID URINE DRUG SCREEN, HOSP PERFORMED
Amphetamines: NOT DETECTED
Amphetamines: NOT DETECTED
Barbiturates: NOT DETECTED
Barbiturates: NOT DETECTED
Benzodiazepines: POSITIVE — AB
Benzodiazepines: POSITIVE — AB
Cocaine: NOT DETECTED
Opiates: POSITIVE — AB
Tetrahydrocannabinol: NOT DETECTED

## 2010-11-02 LAB — CBC
HCT: 38.7 % — ABNORMAL LOW (ref 39.0–52.0)
Hemoglobin: 13.7 g/dL (ref 13.0–17.0)
MCH: 33.3 pg (ref 26.0–34.0)
MCH: 34.4 pg — ABNORMAL HIGH (ref 26.0–34.0)
MCHC: 33.8 g/dL (ref 30.0–36.0)
MCHC: 35.4 g/dL (ref 30.0–36.0)
MCV: 97.2 fL (ref 78.0–100.0)
Platelets: 223 10*3/uL (ref 150–400)
Platelets: 226 K/uL (ref 150–400)
RBC: 3.98 MIL/uL — ABNORMAL LOW (ref 4.22–5.81)
RDW: 13.5 % (ref 11.5–15.5)
RDW: 14.1 % (ref 11.5–15.5)
WBC: 12.3 K/uL — ABNORMAL HIGH (ref 4.0–10.5)

## 2010-11-02 LAB — COMPREHENSIVE METABOLIC PANEL
AST: 20 U/L (ref 0–37)
Albumin: 4.1 g/dL (ref 3.5–5.2)
Calcium: 8.8 mg/dL (ref 8.4–10.5)
Creatinine, Ser: 0.98 mg/dL (ref 0.4–1.5)
GFR calc Af Amer: >60 mL/min (ref >60)
GFR calc non Af Amer: >60 mL/min (ref >60)
Total Protein: 6.5 g/dL (ref 6.0–8.3)

## 2010-11-02 LAB — BASIC METABOLIC PANEL
Calcium: 9.2 mg/dL (ref 8.4–10.5)
GFR calc non Af Amer: >60 mL/min (ref >60)
Glucose, Bld: 127 mg/dL — ABNORMAL HIGH (ref 70–99)
Sodium: 141 mEq/L (ref 135–145)

## 2010-11-02 LAB — DIFFERENTIAL
Basophils Absolute: 0.1 10*3/uL (ref 0.0–0.1)
Basophils Relative: 1 % (ref 0–1)
Eosinophils Absolute: 0.3 K/uL (ref 0.0–0.7)
Eosinophils Relative: 2 % (ref 0–5)
Eosinophils Relative: 3 % (ref 0–5)
Lymphocytes Relative: 21 % (ref 12–46)
Lymphocytes Relative: 26 % (ref 12–46)
Lymphs Abs: 2.6 K/uL (ref 0.7–4.0)
Lymphs Abs: 2.8 10*3/uL (ref 0.7–4.0)
Monocytes Absolute: 0.4 10*3/uL (ref 0.1–1.0)
Monocytes Absolute: 0.4 K/uL (ref 0.1–1.0)
Monocytes Relative: 3 % (ref 3–12)
Monocytes Relative: 4 % (ref 3–12)
Neutro Abs: 9 10*3/uL — ABNORMAL HIGH (ref 1.7–7.7)
Neutrophils Relative %: 73 % (ref 43–77)

## 2010-11-02 LAB — BASIC METABOLIC PANEL WITH GFR
BUN: 5 mg/dL — ABNORMAL LOW (ref 6–23)
CO2: 28 meq/L (ref 19–32)
Chloride: 106 meq/L (ref 96–112)
Creatinine, Ser: 0.98 mg/dL (ref 0.4–1.5)
GFR calc Af Amer: >60 mL/min (ref >60)
Potassium: 3.8 meq/L (ref 3.5–5.1)

## 2010-11-23 LAB — DIFFERENTIAL
Basophils Absolute: 0 10*3/uL (ref 0.0–0.1)
Basophils Relative: 0 % (ref 0–1)
Eosinophils Relative: 2 % (ref 0–5)
Lymphocytes Relative: 25 % (ref 12–46)
Neutro Abs: 5.6 10*3/uL (ref 1.7–7.7)

## 2010-11-23 LAB — ETHANOL: Alcohol, Ethyl (B): <5 mg/dL (ref 0–10)

## 2010-11-23 LAB — RAPID URINE DRUG SCREEN, HOSP PERFORMED
Benzodiazepines: POSITIVE — AB
Cocaine: NOT DETECTED
Tetrahydrocannabinol: POSITIVE — AB

## 2010-11-23 LAB — CBC
HCT: 42.9 % (ref 39.0–52.0)
MCHC: 34 g/dL (ref 30.0–36.0)
Platelets: 237 10*3/uL (ref 150–400)
RDW: 14 % (ref 11.5–15.5)

## 2010-11-25 LAB — DIFFERENTIAL
Basophils Absolute: 0.1 10*3/uL (ref 0.0–0.1)
Basophils Relative: 1 % (ref 0–1)
Eosinophils Absolute: 0.2 10*3/uL (ref 0.0–0.7)
Eosinophils Relative: 2 % (ref 0–5)
Monocytes Absolute: 0.5 10*3/uL (ref 0.1–1.0)
Monocytes Relative: 5 % (ref 3–12)
Neutro Abs: 7.2 10*3/uL (ref 1.7–7.7)

## 2010-11-25 LAB — COMPREHENSIVE METABOLIC PANEL
ALT: 11 U/L (ref 0–53)
AST: 19 U/L (ref 0–37)
Albumin: 3.7 g/dL (ref 3.5–5.2)
Alkaline Phosphatase: 55 U/L (ref 39–117)
BUN: 10 mg/dL (ref 6–23)
Chloride: 103 mEq/L (ref 96–112)
Potassium: 3.7 mEq/L (ref 3.5–5.1)
Sodium: 133 mEq/L — ABNORMAL LOW (ref 135–145)
Total Bilirubin: 0.5 mg/dL (ref 0.3–1.2)
Total Protein: 6.3 g/dL (ref 6.0–8.3)

## 2010-11-25 LAB — CBC
HCT: 37.2 % — ABNORMAL LOW (ref 39.0–52.0)
Platelets: 210 10*3/uL (ref 150–400)
WBC: 10.5 10*3/uL (ref 4.0–10.5)

## 2010-12-23 ENCOUNTER — Emergency Department (HOSPITAL_COMMUNITY): Payer: Medicare Other

## 2010-12-23 ENCOUNTER — Emergency Department (HOSPITAL_COMMUNITY)
Admission: EM | Admit: 2010-12-23 | Discharge: 2010-12-23 | Disposition: A | Discharge status: Home / Self Care | Payer: Medicare Other | Attending: Emergency Medicine | Admitting: Emergency Medicine

## 2010-12-23 DIAGNOSIS — S0180XA Unspecified open wound of other part of head, initial encounter: Secondary | ICD-10-CM | POA: Insufficient documentation

## 2010-12-23 DIAGNOSIS — M545 Low back pain, unspecified: Secondary | ICD-10-CM | POA: Insufficient documentation

## 2010-12-23 DIAGNOSIS — G40909 Epilepsy, unspecified, not intractable, without status epilepticus: Secondary | ICD-10-CM | POA: Insufficient documentation

## 2010-12-23 DIAGNOSIS — G8929 Other chronic pain: Secondary | ICD-10-CM | POA: Insufficient documentation

## 2010-12-23 DIAGNOSIS — R296 Repeated falls: Secondary | ICD-10-CM | POA: Insufficient documentation

## 2010-12-23 DIAGNOSIS — S0003XA Contusion of scalp, initial encounter: Secondary | ICD-10-CM | POA: Insufficient documentation

## 2010-12-23 DIAGNOSIS — F329 Major depressive disorder, single episode, unspecified: Secondary | ICD-10-CM | POA: Insufficient documentation

## 2010-12-23 DIAGNOSIS — S0083XA Contusion of other part of head, initial encounter: Secondary | ICD-10-CM | POA: Insufficient documentation

## 2010-12-23 DIAGNOSIS — Z79899 Other long term (current) drug therapy: Secondary | ICD-10-CM | POA: Insufficient documentation

## 2010-12-23 DIAGNOSIS — S0990XA Unspecified injury of head, initial encounter: Secondary | ICD-10-CM | POA: Insufficient documentation

## 2010-12-23 DIAGNOSIS — F3289 Other specified depressive episodes: Secondary | ICD-10-CM | POA: Insufficient documentation

## 2010-12-23 DIAGNOSIS — I10 Essential (primary) hypertension: Secondary | ICD-10-CM | POA: Insufficient documentation

## 2010-12-23 LAB — BASIC METABOLIC PANEL
BUN: 9 mg/dL (ref 6–23)
Calcium: 11.5 mg/dL — ABNORMAL HIGH (ref 8.4–10.5)
GFR calc non Af Amer: >60 mL/min (ref >60)
Glucose, Bld: 131 mg/dL — ABNORMAL HIGH (ref 70–99)
Potassium: 4.2 mEq/L (ref 3.5–5.1)
Sodium: 134 mEq/L — ABNORMAL LOW (ref 135–145)

## 2010-12-23 LAB — DIFFERENTIAL
Basophils Absolute: 0 10*3/uL (ref 0.0–0.1)
Eosinophils Absolute: 0.1 10*3/uL (ref 0.0–0.7)
Eosinophils Relative: 1 % (ref 0–5)
Monocytes Absolute: 0.6 10*3/uL (ref 0.1–1.0)

## 2010-12-23 LAB — CBC
HCT: 40.9 % (ref 39.0–52.0)
MCHC: 34.2 g/dL (ref 30.0–36.0)
Platelets: 235 10*3/uL (ref 150–400)
RDW: 12.7 % (ref 11.5–15.5)
WBC: 9.3 10*3/uL (ref 4.0–10.5)

## 2010-12-28 ENCOUNTER — Emergency Department (HOSPITAL_COMMUNITY): Payer: Medicare Other

## 2010-12-28 ENCOUNTER — Inpatient Hospital Stay (HOSPITAL_COMMUNITY)
Admission: EM | Admit: 2010-12-28 | Discharge: 2010-12-30 | DRG: 918 | Disposition: A | Discharge status: Home / Self Care | Payer: Medicare Other | Attending: Internal Medicine | Admitting: Internal Medicine

## 2010-12-28 DIAGNOSIS — M6282 Rhabdomyolysis: Secondary | ICD-10-CM | POA: Diagnosis present

## 2010-12-28 DIAGNOSIS — Y9229 Other specified public building as the place of occurrence of the external cause: Secondary | ICD-10-CM

## 2010-12-28 DIAGNOSIS — M549 Dorsalgia, unspecified: Secondary | ICD-10-CM | POA: Diagnosis present

## 2010-12-28 DIAGNOSIS — IMO0002 Reserved for concepts with insufficient information to code with codable children: Secondary | ICD-10-CM | POA: Diagnosis present

## 2010-12-28 DIAGNOSIS — G8929 Other chronic pain: Secondary | ICD-10-CM | POA: Diagnosis present

## 2010-12-28 DIAGNOSIS — F341 Dysthymic disorder: Secondary | ICD-10-CM | POA: Diagnosis present

## 2010-12-28 DIAGNOSIS — T400X1A Poisoning by opium, accidental (unintentional), initial encounter: Principal | ICD-10-CM | POA: Diagnosis present

## 2010-12-28 DIAGNOSIS — I10 Essential (primary) hypertension: Secondary | ICD-10-CM | POA: Diagnosis present

## 2010-12-28 DIAGNOSIS — R4182 Altered mental status, unspecified: Secondary | ICD-10-CM | POA: Diagnosis present

## 2010-12-28 LAB — COMPREHENSIVE METABOLIC PANEL
ALT: 12 U/L (ref 0–53)
AST: 19 U/L (ref 0–37)
CO2: 29 mEq/L (ref 19–32)
Calcium: 9.5 mg/dL (ref 8.4–10.5)
Chloride: 102 mEq/L (ref 96–112)
Creatinine, Ser: 1.09 mg/dL (ref 0.4–1.5)
GFR calc Af Amer: >60 mL/min (ref >60)
GFR calc non Af Amer: >60 mL/min (ref >60)
Glucose, Bld: 72 mg/dL (ref 70–99)
Total Bilirubin: 0.2 mg/dL — ABNORMAL LOW (ref 0.3–1.2)

## 2010-12-28 LAB — URINALYSIS, ROUTINE W REFLEX MICROSCOPIC
Glucose, UA: NEGATIVE mg/dL
Hgb urine dipstick: NEGATIVE
Protein, ur: NEGATIVE mg/dL
Specific Gravity, Urine: 1.031 — ABNORMAL HIGH (ref 1.005–1.030)
pH: 5 (ref 5.0–8.0)

## 2010-12-28 LAB — DIFFERENTIAL
Basophils Absolute: 0.1 10*3/uL (ref 0.0–0.1)
Basophils Relative: 1 % (ref 0–1)
Lymphocytes Relative: 40 % (ref 12–46)
Monocytes Absolute: 0.8 10*3/uL (ref 0.1–1.0)
Neutro Abs: 4.3 10*3/uL (ref 1.7–7.7)
Neutrophils Relative %: 46 % (ref 43–77)

## 2010-12-28 LAB — GLUCOSE, CAPILLARY: Glucose-Capillary: 75 mg/dL (ref 70–99)

## 2010-12-28 LAB — RAPID URINE DRUG SCREEN, HOSP PERFORMED
Amphetamines: NOT DETECTED
Barbiturates: NOT DETECTED
Benzodiazepines: POSITIVE — AB
Opiates: POSITIVE — AB

## 2010-12-28 LAB — ETHANOL: Alcohol, Ethyl (B): <11 mg/dL — ABNORMAL HIGH (ref 0–10)

## 2010-12-28 LAB — CBC
HCT: 41.8 % (ref 39.0–52.0)
Hemoglobin: 14 g/dL (ref 13.0–17.0)
RBC: 4.26 MIL/uL (ref 4.22–5.81)
WBC: 9.5 10*3/uL (ref 4.0–10.5)

## 2010-12-28 LAB — ACETAMINOPHEN LEVEL: Acetaminophen (Tylenol), Serum: <15 ug/mL (ref 10–30)

## 2010-12-29 LAB — COMPREHENSIVE METABOLIC PANEL
ALT: 12 U/L (ref 0–53)
AST: 23 U/L (ref 0–37)
Albumin: 3.2 g/dL — ABNORMAL LOW (ref 3.5–5.2)
Calcium: 8.6 mg/dL (ref 8.4–10.5)
GFR calc Af Amer: >60 mL/min (ref >60)
Glucose, Bld: 95 mg/dL (ref 70–99)
Potassium: 3.2 mEq/L — ABNORMAL LOW (ref 3.5–5.1)
Sodium: 136 mEq/L (ref 135–145)
Total Protein: 6.1 g/dL (ref 6.0–8.3)

## 2010-12-29 LAB — CBC
HCT: 37.6 % — ABNORMAL LOW (ref 39.0–52.0)
Hemoglobin: 12.4 g/dL — ABNORMAL LOW (ref 13.0–17.0)
MCV: 98.7 fL (ref 78.0–100.0)
RBC: 3.81 MIL/uL — ABNORMAL LOW (ref 4.22–5.81)
WBC: 8.1 10*3/uL (ref 4.0–10.5)

## 2010-12-29 LAB — CARDIAC PANEL(CRET KIN+CKTOT+MB+TROPI)
CK, MB: 6.5 ng/mL (ref 0.3–4.0)
Relative Index: 1 (ref 0.0–2.5)
Relative Index: 1 (ref 0.0–2.5)
Relative Index: 0.7 (ref 0.0–2.5)
Total CK: 684 U/L — ABNORMAL HIGH (ref 7–232)
Troponin I: <0.3 ng/mL (ref <0.30)
Troponin I: <0.3 ng/mL (ref <0.30)

## 2010-12-29 LAB — CORTISOL: Cortisol, Plasma: 12 ug/dL

## 2010-12-29 LAB — DIFFERENTIAL
Basophils Absolute: 0.1 10*3/uL (ref 0.0–0.1)
Lymphocytes Relative: 31 % (ref 12–46)
Lymphs Abs: 2.5 10*3/uL (ref 0.7–4.0)
Neutro Abs: 4.6 10*3/uL (ref 1.7–7.7)

## 2010-12-30 DIAGNOSIS — F39 Unspecified mood [affective] disorder: Secondary | ICD-10-CM

## 2011-01-02 NOTE — Procedures (Signed)
HISTORY:  The patient is a 40 year old admitted on August 23, 2007 for  seizure behavior, chronic lumbar pain, anxiety, panic disorder, two  prior suicide attempts, left rib fracture, lacerations on the head,  alcohol and illicit drug use, withdrawal and brain trauma/tumor  (780.39).   PROCEDURE:  The tracing is carried out on a 32-channel digital Cadwell  recorder reformatted into 16 channel montages with one devoted to EKG.  The patient was awake during the recording.  The [employer] 10/20  system lead placement was used.   MEDICATIONS:  1. Ativan.  2. Oxycodone.  3. Protonix.  4. Lovenox.  5. Nicoderm patch.  6. Ambien.   DESCRIPTION OF FINDINGS:  Dominant frequency is a 10 Hz 20-25 microvolt  alpha range activity.  Superimposed upon this is mixed frequency  frontally predominant under 10 microvolt beta range activity.  The  record does not change state.  Hyperventilation was carried out without  change in background.  Intermittent photic stimulation induced a  sustained driving response from 1-61 Hz.  There was no interictal  epileptiform activity in the form of spikes or sharp waves.   ELECTROCARDIOGRAM:  Regular sinus rhythm with ventricular response of 72  beats per minute.   IMPRESSION:  Normal waking record.      Deanna Artis. Sharene Skeans, M.D.  Electronically Signed     WRU:EAVW  D:  08/26/2007 15:11:34  T:  08/26/2007 17:28:11  Job #:  [id number]   cc:   Cliffton Asters, M.D.  Fax: [phone]

## 2011-01-02 NOTE — Discharge Summary (Signed)
NAME:  Eddie Williams, Eddie Williams NO.:  [id number]   MEDICAL RECORD NO.:  [id number]          PATIENT TYPE:  IPS   LOCATION:  0505                          FACILITY:  [facility]   PHYSICIAN:  Jasmine Pang, M.D. DATE OF BIRTH:  [birth year]   DATE OF ADMISSION:  02/28/2007  DATE OF DISCHARGE:  03/01/2007                               DISCHARGE SUMMARY   IDENTIFYING INFORMATION:  This is a 40 year old white male.  This is a  voluntary admission.   HISTORY OF PRESENT ILLNESS:  This pleasant 40 year old white male  presented to our hospital as a walk-in patient requesting detox off of  his multiple pain medications which he takes for chronic back and neck  pain for which he has had surgery.  He reports that his taking  medications had become a point of contention between him and his  girlfriend of several years.  He feels rather harassed about it, that  she wants him off the pain medicine, although these are prescribed for  many years by his physician and he denies any abuse of the same.  His  girlfriend had threatened him saying that, if he did not get off the  medications, she would leave him.  He was wanting to be compliant to  preserve the relationship and be a good father to their daughter.  He  denied suicidal ideation.  He admits that he had had some fleeting  suicidal thoughts when his girlfriend was threatening him.   MEDICAL HISTORY:  The patient is followed by Dr. Lacie Scotts at [facility], who manages his pain medications, and he sees him every  30 days for medications.   CURRENT MEDICATIONS:  Albuterol 2 puffs q.4h. p.r.n. for asthma,  oxycodone 5 mg t.i.d. for breakthrough pain p.r.n., OxyContin 40 mg  q.12h., Ambien 12.5 mg p.o. q.h.s., Soma tabs to take for muscle spasms  up to t.i.d. as needed p.r.n. and Xanax 1 mg q.i.d.   LABORATORY DATA:  Diagnostic studies were within normal limits.   PHYSICAL EXAMINATION:  Physical exam was all within normal  limits  including review of systems.  His pain was at baseline.   MENTAL STATUS EXAM:  Fully alert male.  Pleasant and cooperative with  good insight.  He was calm, composed, pleasant and fully engaged in  conversation.  Mood and affect were appropriate.  Mood euthymic.  No  evidence of suicidal or homicidal thought.  His insight was good.  He  shared with [place] a lot of information about his frustrations with  relationship with the girlfriend, feeling that she was causing a lot of  chronic conflict and may have some ulterior motive for insisting that he  come off medications that are managed by his primary care physician.  Felt, after being here for a few hours, that this is not really what he  needed, that he did not need to come off the medication and he had no  reason to discontinue meds that were working for him.  He began to feel  that it was better for him to consider going to  live with another  relative if needed and was going to pursue those contacts but was  planning to return home and make an attempt to work things out with her.  He was opened to the idea of leaving the relationship though if he  continued to be harassed.  Cognition completely preserved.   PLAN:  The plan was to discharge the patient.  He will go ahead and  continue follow-up with Dr. Evelene Croon, his primary care  physician in [employer], [place] and he will make his own  appointment.   DISCHARGE DIAGNOSIS:  AXIS I:  Acute adjustment reaction without  underlying mood disorder.  AXIS II:  No diagnosis.  AXIS III:  Chronic neck and back pain, stable.  AXIS IV:  Moderate (chronic relationship conflict).  AXIS V:  Current 60; past year 77.   DISCHARGE MEDICATIONS:  The patient was discharged today on same  medications as admission.   FOLLOWUP:  He will follow up with his primary care physician.  The  patient is under the service of Dr. Milford Cage.      Margaret A. Lorin Picket, N.P.       Jasmine Pang, M.D.  Electronically Signed    MAS/MEDQ  D:  03/14/2007  T:  03/15/2007  Job:  [id number]

## 2011-01-02 NOTE — H&P (Signed)
NAME:  Eddie Williams, Eddie Williams NO.:  [id number]   MEDICAL RECORD NO.:  [id number]          PATIENT TYPE:  IPS   LOCATION:  0506                          FACILITY:  [facility]   PHYSICIAN:  Geoffery Lyons, M.D.      DATE OF BIRTH:  04-19-1971   DATE OF ADMISSION:  05/05/2007  DATE OF DISCHARGE:                       PSYCHIATRIC ADMISSION ASSESSMENT   IDENTIFYING INFORMATION:  This is a 40 year old divorced white male.  He  presented yesterday, reporting a history of bipolar disorder with  chronic pain.  He reported feeling hopeless and overwhelmed due to his  fiance kicking him out and turning my daughter against me.  He  reported suicidal as well as homicidal ideation toward the finance,  panic attacks, poor sleep, and feelings of hopelessness.  He requested  hospitalization before I do something stupid.  He states that this  girl is the love of his life.  They have been together for eight years  and around the 4th of July something changed.  All of a sudden she  became quite unhappy with him.  She has been turning their daughter  against him and apparently she threw him out of the house back on February 28, 2007.  He presented here at that time and was evaluated.  He was not  using any drugs and left the next day.  He has been staying at his  mother's house since then.   PAST PSYCHIATRIC HISTORY:  He has one prior admission to [facility]  back in 2004.  Apparently, he held a gun to his head and was given a 90-  day outpatient commitment as a result of this.   SOCIAL HISTORY:  He went to the 10th grade.  He has been married and  divorced once.  He has a 53 year old daughter from that union and he has  the 39-year-old daughter with the current fiance.  He is disabled and  receives SSCI because of his back.   FAMILY HISTORY:  He states his mother is schizoaffective, bipolar type,  but is only on Paxil and he states that his father was a paranoid  schizophrenic but he never knew what  medicine his father took.  Apparently, his maternal uncle committed suicide.   ALCOHOL AND DRUG HISTORY:  He smokes two packs per day.  He denies any  other drug use.   FAMILY CARE PHYSICIAN:  His family care physician is Dr. Geralyn Corwin.   MEDICAL PROBLEMS:  He is status post two surgeries for degenerative disk  disease, lower back.   MEDICATIONS:  He is currently prescribed:  OxyContin 40 mg b.i.d.,  Percocet 10/325 b.i.d., Soma 350 mg b.i.d., Xanax 1 mg t.i.d., and  Ambien CR 12.5 mg at h.s.   ALLERGIES:  PENICILLIN, ERYTHROMYCIN, and IBUPROFEN.  All produce a  rash.   MENTAL STATUS EXAM:  Today, he is alert and oriented x3.  He is  appropriately groomed, dressed, and nourished.  His speech is not  pressured.  His mood is appropriate to the situation.  His affect is  normal range.  His thought processes are clear, rational,  and goal  oriented.  He wants to preserve his relationship with his 26-year-old  daughter.  Judgment and insight are intact.  Concentration and memory  are intact.  Intelligence is at least average.  He is not actively  suicidal, although he is still quite upset with the fiance.  He is no  homicidal and he does not have auditory or visual hallucinations.   AXIS I:  Bipolar, depressed.   AXIS II:  Deferred.   AXIS III:  Chronic back pain.   AXIS IV:  Problems with primary support group.   AXIS V:  30.   PLAN:  The plan is to continue his current pain medications.  There is a  family session scheduled for tomorrow afternoon with his mother and will  start some Remeron 15 mg at h.s. tonight to help with sleep.   ESTIMATED LENGTH OF STAY:  Two days.      Mickie Leonarda Salon, P.A.-C.      Geoffery Lyons, M.D.  Electronically Signed    MD/MEDQ  D:  05/06/2007  T:  05/07/2007  Job:  [id number]

## 2011-01-04 LAB — GLUCOSE, CAPILLARY: Glucose-Capillary: 132 mg/dL — ABNORMAL HIGH (ref 70–99)

## 2011-01-05 NOTE — Discharge Summary (Signed)
NAME:  Eddie Williams, Eddie Williams               ACCOUNT NO.:  [id number]   MEDICAL RECORD NO.:  [id number]          PATIENT TYPE:  INP   LOCATION:  4711                         FACILITY:  [facility]   PHYSICIAN:  C. Ulyess Mort, M.D.DATE OF BIRTH:  31-Jan-1971   DATE OF ADMISSION:  01/18/2006  DATE OF DISCHARGE:  01/21/2006                                 DISCHARGE SUMMARY   DISCHARGE DIAGNOSES:  1.  Seizures, thought secondary to benzodiazepine withdrawal.  2.  Polysubstance abuse.  3.  Hypokalemia, resolved.  4.  Oral thrush, resolved.  5.  Chronic back pain secondary to laminectomy.  6.  History of suicide attempts 2004 and 2005.  7.  Depression.  8.  History of appendectomy.  9.  History of catheterization 2002, normal.   DISCHARGE MEDICATIONS:  1.  Lexapro 20 mg p.o. daily.  2.  Xanax 1 mg p.o. b.i.d. p.r.n. anxiety, have given the patient a      prescription for #30.  Our goal is to taper him completely off of this      medication.  3.  Percocet 5/325 mg q.6h. p.r.n. pain.  Patient has been instructed that      he could alternate this medication with Tylenol as tolerated, again with      a goal of tapering down on the narcotic.   CONDITION ON DISCHARGE:  Improved with no more seizure activity after the  day of admission.  The patient has decided to follow up in our clinic for  further management of his chronic medical problems.  We have instructed him  that if this is his decision, he is no longer to receive prescription,  notably benzodiazepines or narcotics, from his former primary care  physician.   PROCEDURE:  1.  CT head without contrast, no skull fracture or intracranial hemorrhage.      Marked chronic left maxillary and left frontal sinusitis and mild      chronic bilateral ethmoid sinusitis.  2.  January 18, 2006, chest x-ray, no acute disease.  Chest is hyperexpanded.  3.  January 19, 2006, MRI of brain with and without contrast, no acute infarct      or abnormal intracranial  enhancing lesion, left frontal ethmoid and      maxillary sinus disease, minimal linear dural enhancement, clinical      correlation recommended.   BRIEF HISTORY AND PHYSICAL:  Eddie Williams is a 40 year old with past medical  history significant for depression with history of suicidal attempts by  overdose, chronic pain/anxiety on narcotics and benzodiazepines, who  presents to the emergency department after seizure-like episodes the morning  of admission.  The patient's fiance states that he got up from the couch,  walked around in circles and fell to the ground, flopping around like a  fish.  She states he became cyanotic around his mouth, made gurgling sounds  with convulsive like activities.  There was no loss of bowel or bladder  control. Patient was confused upon awakening and had no recollection of the  events.  Similar symptoms occurred on Dec 20, 2005, with two seizure-like  episodes for which the patient was brought to the emergency department,  observed and sent home.  The patient does admit to taking more Xanax than  prescribed at times, up to eight a day, but recently states that he has been  trying to cut back to about two a day.  Patient's fiance is also concerned  that he has been taking too many of his pain pills and anxiety pills and  states that his pill bottles are now empty.  Patient denies any fever,  altered mental status, meningismus, unusual headaches or alcohol use.   MEDICATIONS:  Per the patient's pharmacy which is [facility] [place],  [place].  1.  Boniva.  2.  Lexapro 20 mg daily.  3.  Flexeril 10 mg t.i.d.  4.  Phenergan 25 mg t.i.d.  5.  Percocet 5/325 q.6h. p.r.n.  6.  Xanax 1 mg q.6h. p.r.n.  7.  Ambien CR 12.5 mg nightly p.r.n.  8.  Relpax 40 mg p.r.n.   PHYSICAL EXAMINATION:  GENERAL APPEARANCE:  He is a thin Caucasian male in  no acute distress, alert and oriented x3.  VITAL SIGNS:  Temperature 100, blood pressure 133/81, pulse 96,  respirations  20, oxygen saturation 100% on room air.  HEENT:  Eyes:  Pupils are dilated but reactive and symmetric.  ENT:  Oropharynx is erythematous with white plaques on mouth and throat.  NECK:  Supple, no lymphadenopathy, no meningismus.  LUNGS:  Clear bilaterally.  CARDIOVASCULAR:  Regular rate and rhythm with no murmurs, rubs, or gallops.  ABDOMEN:  Soft, mildly tender to palpation, good bowel sounds.  EXTREMITIES:  No edema.  SKIN:  Multiple tattoos.  NEUROLOGIC:  Cranial nerves II-XII intact.  Alert and oriented x4.  Normal  strength and sensation throughout.  Normal deep tendon reflexes.   ADMISSION LABORATORY DATA:  Urine drug screen positive for opiates and  benzodiazepines.  Alcohol level less than 5.  White cell count 14.3 with ANC  of 11.2,  hemoglobin 15.3, platelets 269.  Sodium 140, potassium 2.9, bicarb  26, BUN 5, creatinine 0.9.  Glucose 106.   HOSPITAL COURSE BY PROBLEM:  PROBLEM #1 -  NEW ONSET SEIZURES:  The  patient's pharmacy was called and it was determined that he received 121 mg  Xanax on Jan 03, 2006, and 120 Percocet on Jan 03, 2006.  Both of these pill  bottles were empty according to the patient's fiance.  It is thought that  his seizures are most likely due to excessive use of benzodiazepine and then  withdrawal when he ran out.  The evening of admission, the patient was up  using the bathroom and fell to the ground and was found to be flopping  around by the nursing staff.  He did obtain a small laceration on his  forehead which was sutured by the physician on the night team, following  this event.  He was reoriented and remained seizure-free for the rest of  this  admission.  He received 1 mg of Xanax b.i.d. during this  hospitalization, with no further seizure activity.  HIV test was  nonreactive.  CT of the head obtained after the seizure with fall showed no acute findings and MRI was done for completeness which showed sinusitis and  minimal dural  enhancement, however, there were no clinical signs of  meningitis and dural enhancement was thought to be very nonspecific  findings.  An EEG was also obtained prior to the patient's discharge.  The  results of this test are pending.  Prolactin level was obtained after the  patient's seizure on the evening of admission.  These were within normal  range at 9.9, however, prolactin was elevated compared to the one obtained  at admission which was 1.1.  Eddie Williams will be discharged with a  prescription for Xanax 1 mg b.i.d. p.r.n. #30.  Have encouraged him to try  to taper off of this medication and this will be our goal in clinic.  He  agreed not to receive prescriptions from other physicians or to take his  medication other than prescribed.   PROBLEM #2 -  CHRONIC BACK PAIN:  Patient has a longstanding history of  chronic back pain secondary to laminectomy from herniated disc.  He will  continue using the Percocet as needed for pain again with the goal of  tapering off of this narcotic medication.  I will give him a prescription  for #30 of 5/325 until he is seen back in clinic.  He has been advised to  alternate this medication with Tylenol as tolerated for management of his  pain.  He would likely be an ideal candidate for pain clinic to manage his  chronic pain.   PROBLEM #3 -  HYPOKALEMIA:  Patient's potassium was 2.9 on admission,  unclear why.  His potassium was low.  Magnesium level was checked and it was  within normal limits.  There is no history of nausea or vomiting.  Potassium  was repleted and remained normal throughout the hospitalization.   PROBLEM #4 -  DEPRESSION:  Lexapro will be continued as an outpatient.   PROBLEM #5 -  ORAL THRUSH:  Nystatin swish and swallow was used during the  hospitalization.  The thrush was improved at the time of discharge, unclear  why he developed oral thrush.  An HIV test was nonreactive.  This can be  followed up as an outpatient.    DISCHARGE LABORATORY DATA AND VITAL SIGNS:  Temperature 97.6, blood pressure  130/87, pulse 74, respirations 20, oxygen saturation 100% on room air.   White blood cell count 9, hemoglobin 14.2, platelets 248.  Sodium 143,  potassium 4, chloride 104, bicarb 31, BUN 7, creatinine 1, glucose 92.      Eddie Williams, M.D.    ______________________________  C. Ulyess Mort, M.D.    Eddie Williams  D:  01/21/2006  T:  01/22/2006  Job:  [id number]   cc:   Evelene Croon  Fax: [phone]   Outpatient Clinic

## 2011-01-05 NOTE — Group Therapy Note (Signed)
REFERRED BY:  Danae Orleans. Venetia Maxon, M.D.   HISTORY OF PRESENT ILLNESS:  This is a 40 year old white male with a long  history of low back pain.  He states that he has never had an accident to  cause his pain.  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  He also worked in [employer] and doing some Optometrist as well.  He had his laminectomies in 1998, at L5-S1 by Dr. Kathaleen Maser. Pool, and again  in 2002, by Dr. Jordan Likes at L4-L5.  He has had epidural injections which seem to  have only exacerbated his pain.  He states in addition to his low back problems, he has had problems in the  cervical spine area.  He has pain in the neck and into the head, causing  headaches.  He has numbness and paresthesias into the bilateral upper  extremities.  He had an MRI of the C-spine and brain performed in June 2004,  which revealed no obvious disease in the brain.  There was a mild bulge and  disk protrusion at C6-C7, but otherwise minimal abnormalities.  The patient  says that he has had other pain injections, which he has difficulty  describing to me today.  He wears a lumbar corset for comfort, without which  he will keel over or fall over, because of severe pain.  He is followed by  psychiatry for depression and anxiety apparently.  He was recently placed on  Seroquel and Cymbalta, after which he began to experience urinary retention  and had to go to the emergency room for a catheterization.  He has since  stopped these medications and was started on Ambien.  His psychiatric  followup is pending.  From a standpoint of the level of pain, the patient rates his pain at 8-  9/10.  The pain is worse with any type of activity, including walking,  bending, sitting, or working.  It is better with rest, heat and medications.  He likes to lie down generally to relieve pain.  He takes Lorcet 10/650, one  p.o. q.i.d., as well  as Soma 350 mg p.o. q.i.d.  He has not taken any other  pain medications by his account.  He has been on the Lorcet for a few years  apparently.   PAST MEDICAL HISTORY:  1. Significant for headaches.  2. Appendicitis and appendectomy.  3. Anxiety and depression disorder.  4. History of tobacco use of two packs per day.  5. Lumbar laminectomy in 1998, at L5-S1.  6. Lumbar laminectomy in 2002, at L4-L5.   MEDICATIONS:  1. Lorcet 10/650, one p.o. q.i.d.  2. Soma 350 mg, one p.o. q.i.d.  3. Cymbalta 30 mg daily, which is on hold.  4. Seroquel 50 mg b.i.d., which is on hold.  5. Ambien 10 mg q.h.s.  6. Multivitamin daily.  7. B12 supplement daily.   ALLERGIES:  PENICILLIN, ERYTHROMYCIN AND IBUPROFEN.   FAMILY HISTORY:  Positive for heart disease, lung disease, diabetes and high  blood pressure.  His mother has had back surgeries and chronic pain.   SOCIAL HISTORY:  The patient lives with his mother.  He is divorced.  He  reports tobacco use as mentioned above.  He denies alcohol abuse.  He last  worked in March 2002.   REVIEW OF SYSTEMS:  The patient reports heart palpitations.  He has no  chest  pain, shortness of breath, cold or flu symptoms, wheezing or coughing.  He  also reports weakness, numbness, dizziness, spasms, vertigo, confusion, as  well as blurred vision, anxiety, depression, sleep problems, agitation and  headaches.  He also reports nausea and diarrhea, bladder incontinence,  urinary retention, painful urination, abdominal pain, decreased appetite,  weight loss and fever.   PHYSICAL EXAMINATION:  VITAL SIGNS:  Blood pressure 123/88, pulse 96.  His  saturation is 97% on room air.  NEUROLOGIC/MUSCULOSKELETAL:  The patient walks with a very rigid gait  pattern.  He does not favor either side.  His affect is anxious and often  flat as well.  His appearance is fairly normal from a grooming standpoint.  On examination of the neck he had diffuse pain with palpation.   There are no  focal muscle spasms noted.  He had pain with palpation of the spinous  processes, really from C2 down to C7.  He had less pain in the thoracic  region, but the pain resurfaced again with palpation in the upper lumbar and  lower thoracic area.  Paraspinals are tender as well as the facets.  He had  a minimal threshold for pain with palpation today.  Spurling's test was  equivocal, really not accurate.  The upper extremity motor examination was  really 5/5, less pain inhibition.  Reflexes were 2+.  Sensory examination  was 1/2, as he felt that pinprick was dull on both sides.  The lower  extremity examination was 3-4/5 motor-wise, with a large pain inhibition  component.  Reflexes were 2+.  Sensory exam was 1/2, slightly worse in the  left lower extremity.  The only place he had normal sensation today was in  the mid-back and abdomen.  The Phalen's test was negative in the upper  extremities.  Straight leg raising test was positive in the bilateral lower  extremities.  Patrick's test was painful as well.  The patient had minimal  back range of motion in the lumbar spine, with essentially 30 degrees of  forward flexion, 10 degrees of extension, lateral bending approximately 10  degrees to either side.  The patient had slight levoscoliosis from the upper  lumbar to mid-thoracic spine approximately three to four degrees.  He had  hyperlordosis of the low back.  A surgical scar was noted in the lumbar  spine.  There was some palpable firm tissue beneath the skin on either side.  Otherwise cognitively the patient was appropriate.  Cranial nerve  examination appeared to be intact.   ASSESSMENT:  1. Chronic low back pain, status post lumbar laminectomies - code #722.83.  2. Questionable cervical radiculopathies - code #723.4.  3. Questionable lumbar radiculopathies - code #724.4.  4. Mild cervical disk disease - code #722.4.   PLAN: 1. I will start  conservatively with this  gentleman.  I will begin him on a     fentanyl patch 25 mcg q.72h.  2. I refilled Lorcet 10/650, #90 today, to be used on a q.6h. p.r.n. basis.  3. Will continue with the Soma 350 mg q.12h. p.r.n.  4. We will have a urine drug screen taken today.  5. The patient needs psychiatric followup on a regular basis, to help him     deal with pain and mood issues, as well as sleep.  6. I will see the patient back in approximately one month's time.     Ranelle Oyster, M.D.   ZTS/MedQ  D:  12/08/2003 10:16:29  T:  12/08/2003 13:11:45  Job #:  [id number]   cc:   Danae Orleans. Venetia Maxon, M.D.  [address]  [place]  [place] [address]  Fax: [phone]

## 2011-01-05 NOTE — H&P (Signed)
[place]  Patient:    Eddie Williams, Eddie Williams                      MRN: [id number] Adm. Date:  [id number] Attending:  Feliciana Rossetti                         History and Physical  CHIEF COMPLAINT:  Found down and cyanotic by mother.  HISTORY OF PRESENT ILLNESS:  A 40 year old white male who took intentional overdose of methadone and Valium and tricyclic antidepressants.  The patient was found down by mother, noted to be cyanotic, and thought not to be breathing, and the patient was transported by ambulance to [facility] Emergency Room.  Dr. Cathren Laine saw the patient, decontaminated his gastrointestinal tract, and gave him an opioid antidote: Narcan 2 mg IV x 1. Repeated dosing of the Narcan was necessary to keep the patient with normal vital signs.  The patient denied any other ingestion, including alcohol, Tylenol, and aspirin.  The patient denied any recent fevers, chills, nausea, vomiting, diarrhea.  He stated he simply wanted to cease being a burden to his family.  PHYSICAL EXAMINATION:  GENERAL:  The patient is pale-appearing, difficult to arouse (arouses to painful stimulus).  VITAL SIGNS:  Respiratory rate 12, blood pressure 108/70, heart rate 80, temperature 96.  HEENT:  Pupils are equal, round and reactive to light.  Extraocular movements intact.  Fundi without exudate or hemorrhage.  Normal disk edges.  Oropharynx normal.  Mucus membranes moist.  NECK:  Supple, lymph negative.  No carotid bruits.  HEART:  Regular rate and rhythm, no murmurs, rubs, or gallops.  CARDIAC:  Clear to auscultation.  Good symmetric air movement.  ABDOMEN:  Soft, no hepatosplenomegaly, no rebound, no guarding, normal bowel sounds.  EXTREMITIES:  Muscle strength 5/5 and symmetric.  No clubbing or edema.  The patient is acrocyanotic, but with good distal pulsations.  RECTAL:  Tone is normal, no blood in vault.  NEUROLOGIC:  Cranial nerves II-XII are  within normal limits.  LABORATORY DATA:  Drug screen reveals benzodiazepines, no opioids, ______, and tricyclics.  Metabolic profile and blood cell count is within normal limits.  PAST MEDICAL HISTORY:  Depression.  PAST SURGICAL HISTORY:  Back surgery x 2.  MEDICATIONS: 1. Methadone. 2. Valium.  ALLERGIES:  SULFA, ERYTHROMYCIN.  REVIEW OF SYSTEMS:  No loss of bowel or bladder continence, no convulsions, no double vision.  See HPI.  FAMILY HISTORY:  No psychiatric disease.  SOCIAL HISTORY:  The patient is a smoker and does not drink.  Is recently married and is the parent of two children.  ASSESSMENT AND PLAN: 1. Opioid overdose.  Narcan p.r.n. 2. Benzodiazepine overdose.  ______ p.r.n. 3. Tricyclic overdose, will monitor the patient status post decontamination. DD:  06/13/00 TD:  06/13/00 Job: [id number]

## 2011-01-05 NOTE — H&P (Signed)
NAME:  Eddie Williams NO.:  [id number]   MEDICAL RECORD NO.:  [id number]          PATIENT TYPE:  INP   LOCATION:  1826                         FACILITY:  [facility]   PHYSICIAN:  Towana Badger, M.D.       DATE OF BIRTH:  Mar 03, 1971   DATE OF ADMISSION:  10/06/2006  DATE OF DISCHARGE:                              HISTORY & PHYSICAL   CHIEF COMPLAINT:  Bright red blood per rectum.   HISTORY OF PRESENT ILLNESS:  The patient is a 40 year old gentleman who  was in his usual state of health today, when, following a seemingly  normal bowel movement, he noted bright red blood per rectum.  The blood  was dilute in the bowl, and the patient does not recall a sensation of  liquid stool.  He does not note blood streaked stools, but rather bright  red blood on the paper in the toilet water.   He does not recount any prior history of rectal bleeding.  Since this  bowel movement this a.m., he has had no subsequent BMs or rectal  bleeding.  Following the BM, he notes some diffuse left lower quadrant  pain.  He denies any other symptoms including nausea, vomiting,  diarrhea, chest pain, shortness of breath, epigastric pain, fever,  hemorrhoids, etc.   ALLERGIES:  HE IS ALLERGIC TO NSAIDS, SPECIFICALLY IBUPROFEN.   MEDICATIONS:  He uses BC Powder occasionally.   SOCIAL HISTORY:  He does not drink alcohol.  He smokes two to three  packs per day.   Interestingly, when told he would have a colonoscopy, he recalls having  had one for belly pain 10 years ago.  He says it was negative.   PAST MEDICAL HISTORY:  1. Seizure disorder not otherwise specified.  Suspected secondary to      benzo withdrawal with hospitalization July 2007.  2. Polysubstance abuse.  3. Chronic back pain.  4. Degenerative disk disease status post laminectomy.  5. History of suicide attempts 2004, 2005, involuntarily committed in      2005.  6. Depression.  7. Tobacco abuse two packs per day.  8. Chest  pain.  9. Tachy palpitations.  10.Cardiac cath 2002, normal, secondary to #8 and #9.  11.Migraines.  12.Asthma, mild persistent.  13.Nephrolithiasis.   PAST SURGICAL HISTORY:  1. Appendectomy.  2. Laminectomy, diskectomy.   FAMILY HISTORY:  CAD, mother with MI at 75 and with stomach problems,  not otherwise specified, lung disease, diabetes, hypertension.   SOCIAL HISTORY:  The patient is disabled secondary to back pain.  He  sits at home and smokes two packs per day.  He is divorced and has a new  fiance.  He has two daughters since prior marriage.  Prior to  disability, he used to detail cars.   ALLERGIES:  ERYTHROMYCIN, IBUPROFEN, PENICILLIN.   MEDICATIONS:  1. Albuterol p.r.n.  2. Percocet 10/325, 1 tablet q.6 h. p.o.  3. Xanax 1 mg p.o. t.i.d.  4. Soma 250 mg q.6 h. p.o.   REVIEW OF SYSTEMS:  1. Left lower quadrant abdominal pain.  See HPI.  PHYSICAL EXAM:  VITAL SIGNS: T-max 98.3, heart rate 106, BP 134/91,  respirations 16, SPO2 99% on room air.  GENERAL:  The patient is awake, alert no apparent distress.  Slight  pallor.  No icterus.  NECK:  Supple.  CARDIAC:  Sinus tach, no murmur, no JVD, cap refill less than 2 seconds,  no edema, good pulses 2+ peripherally.  PULMONARY: CTAB.  GI:  Abdomen with left lower quadrant tenderness to palpation, soft,  positive bowel sounds, guaiac positive with gross blood.  Some internal  hemorrhoids that are palpable.  No external lesions.  EXTREMITIES:  Warm and well-perfused.   LABORATORY:  CBC:  WBC 12, hemoglobin 15.2, hematocrit 45.1, platelets  213.  BMET:  Sodium 139, potassium 3.9, chloride 103, CO2 28, BUN 8,  creatinine 0.74, glucose 83, AST 21, ALT 15, alk phos 56, total bili  0.6, albumin 4.4, calcium 9.7, INR 1.1.  CT of the abdomen shows no  acute findings.   ASSESSMENT:  The patient is a 40 year old gentleman with bright red  blood per rectum x1.   PLAN:  1. Bright red blood per rectum.  Admit to tele.   Protonix.  Hemoglobin      stable.  Will bolus fluids for tachycardia and recheck CBC.  Spoke      with Dr. Virginia Rochester who was on consult for unassigned gastroenterology.      We agree that this is likely hemorrhoidal bleeding and nonacute.      Dr. Virginia Rochester says that he will see the patient in the morning.  Original      plan was to hold patient n.p.o.. until seen by GI, but given his      presentation, we will likely re-start his diet tonight.  2. Pain disorder.  Continue home meds.  3. Tobacco abuse.  Nicoderm patch.  He may need two.  Smoking      cessation consult.  4. Asthma, stable, albuterol p.r.n.  Recommend stop smoking.  5. Prophylaxis:  Ambulate to toilet.  SCDs if persistently in bed.      Protonix as previously noted.   DISPOSITION:  Home pending GI release.      Towana Badger, M.D.     JP/MEDQ  D:  10/06/2006  T:  10/06/2006  Job:  [id number]

## 2011-01-05 NOTE — H&P (Signed)
[facility]  Patient:    Eddie Williams, Eddie Williams                      MRN: [id number] Adm. Date:  [id number] Attending:  Otilio Saber Dictator:   Eduard Roux, N.P.                   Psychiatric Admission Assessment  DATE OF ADMISSION:  Jan 01, 2000  IDENTIFYING INFORMATION:  The patient is a 40 year old divorced white male, involuntarily admitted secondary to depression with homicidal ideation and suicidal ideation.  HISTORY OF PRESENT ILLNESS:  On Jan 01, 2000, the patient was found to be unarousable by his mother, who was concerned that he had overdosed on his medication.  EMS was called at that time without any subsequent disposition. It was felt on the scene that he had not overdosed, and the patient was stating that he was merely very groggy secondary to taking Remeron.  The patient has been increasingly depressed.  He had been verbalizing wanting to go be with his father, who died approximately four years ago and, also, verbalizing a plan to overdose on his medication.  He has been reporting decreased appetite, decreased sleep; he only sleeps two to three hours a night.  Mood lability with crying spells, increased reactivity, increased anxiety.  He also states he has been isolating and not engaging in activities that he would normally like.  When questioned about his homicidal ideation, he states he is just becoming so angry and agitated he feels like "if somebody messes with him he would really hurt them."  He states that his anger is primarily directed towards his sister-in-law, but he denies any true homicidal ideation or plan to hurt her.  He, additionally, is denying suicidal ideation here.  The patient has no history of suicide attempt.  PAST PSYCHIATRIC HISTORY:  The patient was hospitalized at the age of 89 for depression.  He also has been treated on an outpatient basis by his family doctor for generalized anxiety symptoms.  He has had  trials of Zoloft in the past with GI upsets.  He is currently on Paxil, which he states he does not like and does not want to continue.  That is causing him GI upset.  He has taken Prozac in the past but cannot identify his response.  He also has a history of ADD as a child and has taken Ritalin with positive results.  SOCIAL HISTORY:  The patient was divorced recently, in February.  He has one child with whom he has joint custody.  This is a 98-year-old daughter.  He lives with his mother, his brother, and sister-in-law.  FAMILY HISTORY:  His mother suffers from depression and anxiety.  She is on Paxil with positive results.  He has a great-uncle that suicided, and a cousin was a paranoid schizophrenia.  ALCOHOL AND DRUG HISTORY:  He drinks approximately 6-12 beers on the weekends. He has no remote use of abuse.  His last beer was Dec 29, 1999.  MEDICAL HISTORY:  Primary care [facility]:  Dr. Posey Rea.  MEDICAL PROBLEMS:  Appendectomy.  He has also had a diskectomy on November 21, 1999.  He is on short-term disability related to back problems.  MEDICATIONS: 1. Pepcid 20 mg p.r.n. 2. Paxil 20 mg q.a.m.  He has been on this medication approximately six weeks. 3. Valium 10 mg b.i.d.  He has only been taking Valium for one week. 4. Imitrex  50 mg p.r.n. for complaints of migraine. 5. Ultram 50 mg p.r.n. for complaints of migraine. 6. Remeron 30 mg q.h.s.  This medication was stopped one week ago. 7. Serzone 50 mg b.i.d.  This medication was initiation one week ago.  DRUG ALLERGIES:  The patient is reporting intolerances to REMERON with nightmares and difficulty awakening.  PENICILLIN.  ERYTHROMYCIN.  REVIEW OF SYSTEMS:  HEAD:  The patient has a history of migraines.  He has had approximately one to two a month.  He has no history of head trauma or loss of consciousness.  EYES:  He denies visual changes.  The patient does not wear corrective lenses.  EARS:  He denies infections or earaches  or tinnitus. NOSE:  Denies upper respiratory tract symptoms.  No epistaxis.  THROAT: Denies hoarseness, pain, or lesions.  DENTAL:  Denies gum infections or toothaches.  SKIN:  Denies abnormal bruises, rashes, swelling, or acne. CARDIOVASCULAR:  Denies chest pain, previous MI, palpitations, murmur, hypertension, syncope, or edema.  PULMONARY:  He is a one-pack-per-day smoker. He denies any cough, night sweats.  He has no history of asthma. GASTROINTESTINAL:  He has no abdominal pain, peptic ulcer disease.  Denies nausea, vomiting, diarrhea, or dysphagia.  GENITOURINARY:  Denies dysuria, urgency, or frequency.  MUSCULOSKELETAL:  He does have back pain.  As noted in previous medical history, he has had a diskectomy in April.  He denies any joint pain, weakness, numbness, or tingling in his hands and feet.  He does have a history of a fractured right ankle.  NEUROLOGIC:  He has no history of seizures.  He denies blackouts.  HEMATOPOIETIC:  Denies anemia.  The patient has never received a blood transfusion.  ENDOCRINE:  Denies heat or cold intolerances, changes in skin, hair, or nails, heart palpitations, or change in bowel habits.  Has no history of thyroid disease in his family.  Diabetic symptoms:  He is reporting increased thirst, but no polydipsia or polyphagia.  PHYSICAL EXAMINATION:  VITAL SIGNS:  Temperature 98.2, pulse 78, respirations 20.  He is 5 feet 11 inches, weighs 165 pounds.  Blood pressure is 144/87.  GENERAL:  Well-nourished white male.  He is alert and cooperative.  In no acute distress.  SKIN:  Exhibits no abnormal bruises, masses, or swelling.  He does have two tattoos to each biceps.  HEENT:  Head is normocephalic.  Eyes:  Extraocular movements intact.  Pupils are equal, round, and reactive to light.  Funduscopic exam is benign.  Ears: Canals are clear.  Tympanic membranes exhibit normal landmarks.  Nose:  Nares clear.  No septal deviation.  Throat:  There were no  lesions noted.  Trachea is midline, without vesiculations.  Dental:  Good repair.  There are no dentures, missing teeth, caries, or gingivitis.   NECK:  Supple.  Trachea is midline.  CHEST:  Thorax with bilateral symmetric expansion.  LUNGS:  Clear to auscultation.  HEART:  S1, S2.  No clicks, rubs, or murmurs could be appreciated.  VASCULAR:  Pulses were equal.  There were no bruits auscultated of carotid, aortic, or renal.  There was no dependent edema.  ABDOMEN:  Soft.  No masses.  Bowel sounds x 4.  No tenderness.  There was no organomegaly.  GLANDULAR:  There was no supraclavicular, cervical, or axillary adenopathy. Palpable thyroid without nodularity.  BONES AND JOINTS:  There was no swelling, tenderness, or deformity noted.  MUSCULAR:  There was no atrophy.  Strength was equal bilaterally.  Upper  and lower extremities full range of motion.  No cyanosis or clubbing.  NEUROLOGIC:  Cranial nerves II-XII grossly intact.  Motor function was intact. Strength was 5/5.  Cerebellar function was intact.  Patient could perform finger-to-nose, heel-to-shin.  Gait was normal.  Negative Romberg.  Patient could walk in tandem on his toes and on his heels.  Deep tendon reflexes 2+.  IMPRESSION:  No acute findings.  MENTAL STATUS EXAMINATION:  Casually-dressed white male.  He is cooperative. His speech is normal rate and tone and relevant.  His mood is depressed.  His affect is somewhat anxious.  His thought processes are logical and coherent without evidence of psychoses.  He has negative suicidal ideation.  As noted in HPI, he has been expressing vague suicidal ideation to family.  He is denying that at present.  He has no auditory and visual hallucinations.  He denies homicidal ideation.  Is reporting increased agitation.  Cognitive function is intact.  He is alert and oriented x 4 with adequate insight and judgment.  ADMISSION DIAGNOSES: Axis I:    Major depression, recurrent,  severe. Axis II:   None. Axis III:  1. Migraines.            2. History of back surgery with diskectomy. Axis IV:   Severe.  Problems related to primary support group. Axis V:    Current global assessment of functioning is 45; highest in past            year is 70.  TREATMENT PLAN AND RECOMMENDATIONS:  Voluntary admission to [facility] for stabilization.  Fifteen-minute checks for safety.  We will discontinue the Remeron and Serzone and Valium.  All of these medications either from various intolerances ______ should be no signs and symptoms of withdrawal.  Patient has only been taking this one week.  However, we will initiate Klonopin 0.5 mg b.i.d. p.r.n.  The patient, additionally, does not want to take Paxil any more.  We will discontinue that at present.  Given the patients previous medical history of ADD with response to Ritalin.  We will do a trial of Wellbutrin SR 100 mg q.a.m.  Also, will provide Ambien 10 mg q.h.s. p.r.n. for reports of insomnia.  TENTATIVE LENGTH OF STAY AND DISCHARGE PLANS:  Will be two to three days, with follow-up at [facility]. DD:  01/02/00 TD:  01/03/00 Job: [id number]

## 2011-01-05 NOTE — Assessment & Plan Note (Signed)
HISTORY OF PRESENT ILLNESS:  Eddie Williams is here in followup of his low back pain  and post laminectomy syndrome. We initiated a Fentanyl patch last visit, 25  mcg q. 72 hours. Also felt that he needed a regular psychiatric followup. I  had a urine drug screen performed and the patient was positive for alcohol  at a level of 3.9. He also tested positive for propoxyphene as well, which  he did not tell me he was using. Upon questioning today about these results,  the patient told me that he did not drink and was shocked by this result. He  also stated that he had taken 1 or 2 of his mother's Darvocet prior to  coming in. The patient has had increasing pain over the last 1 to 2 days,  particularly in the buttock region. The pain was worse with sitting or  bending. He stated that the ice we had recommended was not overly helpful  for this. He has electrical type pain from his low back into the butt and  frequently into the legs, left greater than right. The pain is rated as an 8  to 10 out of 10 today.   REVIEW OF SYSTEMS:  The patient denies any chest pain, shortness of breath.  He has occasional wheezing and coughing. No new weakness or numbness. He has  had occasional dizziness and spasms. No confusion, blurred vision, problems  with sleep or agitation. He has occasional nausea and diarrhea. Urination is  a problem at times. He denies any fever, chills, weight changes. He has had  reactive rash to the Fentanyl patches and seems to have not gotten  accustomed to these as of yet. He has not tried any ointments or medications  to help  with this rash.   PHYSICAL EXAMINATION:  On physical examination today the patient is standing  in the room with a cane. He appears more alert than on prior visit. He  walked with an antalgic type of gait pattern and flattened lordotic curve in  the lumbar spine. He had limited range of motion  in all planes today. Upon  palpation, he has significant tenderness in  the left PSI series and the  surrounding soft tissue and bony prominences. The right side was also tender  today. His motor function essentially was unchanged in the 3+ to 4 out of 5  area, although there was a large pain component to this. From a cognitive  standpoint, he was following commands, much more alert and attentive. Speech  was less slurred today. Cranial nerve examination was nonfocal.   ASSESSMENT:  1. 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  2. Questionable cervical radiculopathy (723.4).  3. Questionable lumbar radiculopathy (724.4).  4. Mild cervical disk disease (722.4).   PLAN:  1. We will increase the Fentanyl patch to 50 mcg q. 72 hours. I recommended     Benadryl tablets or cream as well as hydrocortisone cream to the area     prior to application.  2. Will continue with Lorcet for breakthrough pain 10/650 1 q. 6 to 8 hours     p.r.n.  3. We changed the Soma to Baclofen 10 mg b.i.d. as the Soma seems to over     sedate him.  4. We repeated his urine drug screen today. I informed him that if I find     any substances other than what we are prescribing for him in his urine,     that  he will be discharged from the clinic. I sent him for a TENS setup     and evaluation with outpatient physical therapy at [facility].     He will place this over the lumbar and     sacral spine areas.  5. Will see him back in about a months time.      Ranelle Oyster, M.D.   ZTS/MedQ  D:  12/29/2003 09:37:35  T:  12/29/2003 10:24:27  Job #:  [id number]   cc:   Danae Orleans. Venetia Maxon, M.D.  [address]  [place]  [place] [address]  Fax: [phone]

## 2011-01-05 NOTE — Cardiovascular Report (Signed)
[place]  Patient:    Eddie Williams, Eddie Williams                        MRN: [id number] Proc. Date: 09/25/00 Attending:  Darlin Priestly, M.D. CC:         Lacretia Leigh. Quintella Reichert, M.D.   Cardiac Catheterization  PROCEDURES: 1. Left heart catheterization. 2. Coronary angiography. 3. Left ventriculogram. 4. Abdominal aortogram.  COMPLICATIONS:  None.  INDICATIONS:  [name] is a 40 year old white male, with a history of intermittent tachy palpitations and chest pain as well as shortness of breath. The patient also has a history of hypertension.  He did undergo a 2-D echocardiogram which revealed normal EF as well as normal valves.  A treadmill Cardiolite revealed subtle anterior wall ischemia with a normal EF.  He is now referred for cardiac catheterization to define his coronary anatomy.  DESCRIPTION OF PROCEDURE:  After given informed written consent, the patient was brought to the cardiac catheterization lab where his right and left groins were shaved, prepped, and draped in the usual sterile fashion.  ECG monitoring was established.  Using modified Seldinger technique, a #6 French arterial sheath was inserted in the right femoral artery.  A 6 French diagnostic catheter was then used to perform diagnostic angiography.  This reveals a large left main with no significant disease.  The LAD is a large vessel which coursed to the apex and gave rise to one diagonal branches.  The LAD has no significant disease.  The first diagonal is a medium sized vessel which bifurcates distally and has no significant disease.  The left circumflex is a medium sized vessel which coursed in the AV groove and gave rise to three obtuse marginal branches.  The AV groove circumflex has no significant disease.  The first and second OMs are medium sized vessels with no significant disease.  A third OM is a small vessel with no significant disease.  The right coronary artery  is a large vessel which is dominant and gives rise to both the PDA as well as the posterolateral branch.  The RCA, PDA, and posterolateral branch have no significant disease.  LEFT VENTRICULOGRAM:  Left ventriculogram reveals preserved EF at 60%.  ABDOMINAL AORTOGRAM:  Abdominal aortogram reveals no evidence of renal artery stenosis.  HEMODYNAMICS:  Systemic arterial pressure 112/72, LV systemic pressure 112/12, LVEDP of 14.  CONCLUSIONS: 1. No significant coronary artery disease. 2. Normal left ventricular systolic function. 3. No evidence of renal artery stenosis. DD:  09/25/00 TD:  09/26/00 Job: [id number]

## 2011-01-05 NOTE — Discharge Summary (Signed)
NAME:  Eddie Williams, Eddie Williams NO.:  [id number]   MEDICAL RECORD NO.:  [id number]          PATIENT TYPE:  IPS   LOCATION:  0506                          FACILITY:  [facility]   PHYSICIAN:  Geoffery Lyons, M.D.      DATE OF BIRTH:  06-02-1971   DATE OF ADMISSION:  05/05/2007  DATE OF DISCHARGE:  05/09/2007                               DISCHARGE SUMMARY   CHIEF COMPLAINT AND PRESENT ILLNESS:  This was the first admission to  [facility] for this 40 year old divorced white male.  Presented reporting history of bipolar disorder with chronic pain.  Reported feeling hopeless and overwhelmed due to his fiancee kicking him  out and turning his daughter against him.  Reports suicidal thoughts as  well as homicidal ideas toward the fiancee.  Panic attacks, poor sleep,  feeling hopeless.  Requests hospitalization before he did something  stupid.  Claimed that this girl was the love of his life.  They have  been together for eight years and around the fourth of July something  changed.  He claimed that all of a sudden she became unhappy with him.  Claimed she has been turning the daughter against him and she threw him  out of the house on February 28, 2007.  Has been staying at his mother's  house since then.   PAST PSYCHIATRIC HISTORY:  One prior admission to [facility]  back in 2004.  He apparently held a gun to his head and was placed on a  90-day outpatient commitment.   ALCOHOL/DRUG HISTORY:  Denies any active drug use.   MEDICAL HISTORY:  Status post surgeries for degenerative disk disease in  the lower back.   MEDICATIONS:  OxyContin 40 mg twice a day, Percocet 10/325 mg twice a  day, Soma 350 mg twice a day, Xanax 1 mg three times a day and Ambien CR  12.5 mg at night.   PHYSICAL EXAMINATION:  Performed and failed to show any acute findings.   LABORATORY DATA:  Results not available in the chart.   MENTAL STATUS EXAM:  Alert, cooperative male  appropriately groomed,  dressed and nourished.  Speech was normal in rate, tempo and production.  Mood depressed.  Affect broad.  Thought processes were logical, rational  and goal-oriented.  Wanted to preserve his relationship with his 5-year-  old daughter.  No delusions.  No active suicidal or homicidal ideation.  Judgment and insight were intact.  Cognition well-preserved.   ADMISSION DIAGNOSES:  AXIS I:  Bipolar disorder, depressed.  AXIS II:  No diagnosis.  AXIS III:  Chronic back pain, status post back surgery, diskogenic back  disease.  AXIS IV:  Moderate.  AXIS V:  GAF upon admission 30; highest GAF in the last year 60.   HOSPITAL COURSE:  He was admitted.  He was started in individual and  group psychotherapy.  He was maintained on his medications.  He was  given Remeron for sleep.  He was switched to Klonopin.  As already  stated, upon admission, he endorsed a lot of stress.  The fiancee  initially left, left the child with him and then, on March 07, 2007,  kicked him out of his house.  Felt that the trigger was the 29-year-old  calling him and telling him negative things.  Started feeling down.  Worthlessness, feeling guilty.  Strong family history of psychiatric  disorders, both mother and the father claimed that they are paranoid  schizophrenic.  Was in [facility].  Did not follow up.  He  was placed on Xanax three years ago due to panic attacks.  Basically  being followed by his primary care Jonea Bukowski.  On disability due to his  back.  On the Remeron, he felt it was too sedating, felt very groggy.  He says that also experiencing increase in his blood pressure.  Endorsed  that the Xanax was not helping anymore.  Claimed that the medication  given him when he was at [facility] was Klonopin and he did  well on it.  Was wanting to be switched.  Still dealing with the anger  toward the ex-girlfriend.  Endorsed that he only wanted to be sure that  she would  not keep him from seeing his daughter.  Claimed that the  mother had witnessed the ex-girlfriend manipulating him to having a  breakdown.  We switched from Xanax to Klonopin.  Will work on Materials [employer].  Going to behavioral therapy.  There was a family session on  May 07, 2007 with his mother.  He endorsed that he came this time  around on his own will for what he felt was going to help to maintain  and be compliant.  Focusing on how to be sure he got visitation rights  or custody of his daughter.  The patient and mother was supportive.  On  May 08, 2007, he felt that the Klonopin was working better than  the Xanax.  Feeling that he could handle things better.  Committed  himself to getting healthier.  Would not allow what was going on with  the girlfriend affect him.  Planning to stay focused and use resources  available to him to be sure he follows the right way of getting to see  his child.  By May 08, 2007, he was in full contact with reality.  There were no active suicidal or homicidal ideation.  No hallucinations.  No delusions.  Was willing and motivated to pursue further outpatient  treatment so we went ahead and discharged to outpatient follow-up.   DISCHARGE DIAGNOSES:  AXIS I:  Depressive disorder not otherwise  specified.  AXIS II:  No diagnosis.  AXIS III:  Diskogenic back disease.  AXIS IV:  Moderate.  AXIS V:  GAF upon discharge 50-55.   DISCHARGE MEDICATIONS:  1. Soma 350 mg, 1 twice a day.  2. Ambien CR 12.5 mg at bedtime for sleep.  3. OxyContin SR 40 mg twice a day.  4. Klonopin 1 mg up to three times as needed for anxiety.  5. Percocet 5/325 mg, 1-2, 2-3 times a day as needed for breakthrough      pain.   FOLLOWUP:  [facility].      Geoffery Lyons, M.D.  Electronically Signed     IL/MEDQ  D:  06/02/2007  T:  06/02/2007  Job:  [id number]
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
## 2011-01-05 NOTE — Discharge Summary (Signed)
[facility]  Patient:    Eddie Williams, Eddie Williams                      MRN: [id number] Adm. Date:  [id number] Disc. Date: [id number] Attending:  Otilio Saber Dictator:   Eduard Roux, NP                           Discharge Summary  IDENTIFYING DATA:  Patient is a 40 year old divorced white male involuntarily admitted secondary to depression with homicidal ideation and suicidal ideation.  On Jan 01, 2000, the patient was found to be unarousable by his mother who was concerned that he had overdosed on his medication.  EMS was called at that time without any subsequent disposition.  It was felt on the scene that he had not overdosed and the patient was stating that he had been merely groggy secondary to taking Remeron.  The patient reports being increasingly depressed.  He has been verbalizing wanting to "be with his father," who died approximately four years ago, and also verbalizing a plan to overdose on his medication.  He has been reporting decreased appetite, decreased sleep.  He only sleeps two to three hours a night.  Mood lability with crying spells, increased reactivity, increased anxiety.  He also states he is isolated and not engaging in activities that he would normally like. When questioned about his homicidal ideation, he states he was just becoming so angry and agitated he feels like "if somebody messes with him he would really hurt them."  He states that his anger is primarily directed towards his sister-in-law but he denies any true homicidal ideation  or plan to hurt her. He additionally is denying suicidal ideation at present.  Patient has no history of suicide attempts.  Patient was hospitalized at the age of 70 for depression.  He has also been treated on an outpatient basis by his family doctor for generalized anxiety symptoms.  He has had trials of Zoloft in the past, with GI upsets.  He is currently on Paxil, which he states he does  not like and does not want to continue.  He has taken Prozac in the past, but could not identify his response.  He also has a history of ADD as a child and has taken Ritalin with positive results.  PAST MEDICAL HISTORY:  Medical care Arzu Mcgaughey is Dr. Juanita Laster.  Medical problems:  He has had an appendectomy.  He also had a diskectomy in November 21, 1999.  He is on short-term disability related to his back problems.  Current medications:  Pepcid 20 mg p.r.n., Paxil 20 mg q.a.m., Valium 10 mg b.i.d., Imitrex 50 mg p.r.n., Ultram 50 mg p.r.n., Remeron 30 mg q.h.s., Serzone 50 mg b.i.d.  ADMITTING MEDICATIONS: 1. Pepcid 20 mg q.d. p.r.n. 2. Valium 10 mg b.i.d. 3. Remeron 30 mg q.h.s. 4. Serzone 50 mg b.i.d.  ALLERGIES:  Patient has intolerances to REMERON with nightmares and difficulty wakening.  True allergies to PENICILLIN and ERYTHROMYCIN.  PHYSICAL EXAMINATION:  Physical examination was performed by the nurse practitioner on the unit.  No acute findings.  MENTAL STATUS EXAMINATION:  Casually dressed white male.  He is cooperative. Speech is normal rate and tone and relevant.  His mood is depressed.  His affect is somewhat anxious.  His thought processes are logical and coherent without evidence of psychoses.  He is negative suicidal ideation.  As noted in HPI,  he has been expressing vague suicidal ideation towards his family.  He is denying that at present.  He has no auditory or visual hallucinations.  He denies homicidal ideation.  He is reporting increased agitation.  Cognitive functions intact.  He is alert and oriented x 4 with adequate insight and judgment.  ADMITTING DIAGNOSES: Axis I:    Major depression, recurrent, severe. Axis II:   None. Axis III:  Migraines, history of back surgery diskectomy. Axis IV:   Severe, related to problems with primary support group. Axis V:    Current global assessment of function is 45, highest past year 70.  LABORATORY DATA:  Urinalysis:   Increased urobilinogen at 1.0.  Thyroid testing was within normal limits.  CBC:  RBCs decreased at 4.21.  CMET within normal limits.  Urine drug screen results were not available at time of dictation.  HOSPITAL COURSE:  The patient was admitted to [facility] for treatment of his depression and stabilization.  We elected to discontinue his Remeron as he reports undue sedation.  We also elected to discontinue the Valium as this might be causing some central nervous system depression.  We changed this to Klonopin 0.5 mg b.i.d. p.r.n.  Additionally, the patient only reports being on Valium a short amount of time.  Given patients history of ADD and lack of response and negative side effects with the Paxil, we started Wellbutrin SR 100 mg q.a.m. and offered Ambien 10 mg q.h.s.  As noted in HPI, patient presented denying suicidal ideation.  Since his admission he continued to feel that way.  He participated in the milieu.  He was exhibiting no side effects or intolerances to his medications.  It was felt that given his lack of suicidal or homicidal ideations that he could be managed on an outpatient basis.  CONDITION ON DISCHARGE:  Patient was discharged in improved condition to his mood, sleep and appetite and alleviation of his suicidal ideation and homicidal ideation.  He was tolerating his medications well.  Patient was verbalizing readiness for discharge.  DISPOSITION:  The patient was discharged home.  FOLLOW UP:  Patient will follow up and attend the partial program.  DISCHARGE MEDICATIONS: 1. Wellbutrin 150 mg SR q.a.m. 2. Serzone 50 mg b.i.d. 3. Klonopin 0.5 mg b.i.d. p.r.n. 4. Ambien 10 mg 1/2 tab to 1 tab q.h.s. p.r.n.  FINAL DIAGNOSIS: Axis I:    Major depression, recurrent, severe. Axis II:   None. Axis III:  History of back surgery with diskectomy, migraines. Axis IV:   Moderate, related to problems with primary support group. Axis V:    Current global  assessment of function 55, highest past year 70. DD:  01/31/00 TD:  02/03/00 Job: [id number]
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
## 2011-01-05 NOTE — Discharge Summary (Signed)
NAME:  Eddie Williams, Eddie Williams NO.:  [id number]   MEDICAL RECORD NO.:  [id number]          PATIENT TYPE:  INP   LOCATION:  3007                         FACILITY:  [facility]   PHYSICIAN:  Lollie Sails, MD      DATE OF BIRTH:  1970-11-12   DATE OF ADMISSION:  08/23/2007  DATE OF DISCHARGE:  08/26/2007                               DISCHARGE SUMMARY   DISCHARGE DIAGNOSES:  1. Seizure related to withdrawal from Xanax.  2. Acute rib fracture in the right side secondary to seizure.  3. History of recurrent seizures in May 2007and June 2007 as well from      withdrawal from benzodiazepines.  4. Bipolar disorder diagnosed in September 2008.  5. History of depression with homicidal and suicidal ideation.  6. History of migraine headaches.  7. History of overdoses on Tricyclic antidepressants, Valium, and      methadone in 2001.  8. Overdose on pain medicines in April 2005.  9. Status post appendectomy.  10.Status post discectomy with hemilaminectomy in 1998 at L5, S1 and      hemilaminectomy in L4-L5 in 2002 with microdiskectomy as well.  11.History of bright red blood per rectum secondary to hemorrhoids in      January 2008.  12.He does have generalized anxiety disorder with frequent panic      attacks.   DISCHARGE MEDICATIONS:  1. OxyContin 20 mg by mouth every 12 hours for 1 month.  2. Percocet  5/325 mg, 1-2 tablets every 6 hours as needed for 1      month.  3. Klonopin 0.5 mg 3 times daily as needed for anxiety.  4. Lexapro 20 mg by mouth once daily.  5. Ambien 5 mg by mouth once nightly as needed.   HOSPITAL FOLLOWUP:  The patient will follow up with Dr. Beverely Pace in the  outpatient clinic of internal medicine on September 15, 2007, in regards  to his acute rib fracture and seizure.  He will follow up with Dr.  Mikey Bussing on September 11, 2007, in regards to his depression/bipolar  disorder.   PROCEDURE PERFORMED:  1. X-rays performed January 3rd indicating nondisplaced  fracture on      the lateral aspect of the 8th rib.  2. CT scan of the head without contrast indicating left frontal scalp      hematoma, but no evidence of skull fracture or acute intracranial      findings.  3. EEG performed on 08/26/2007 indicating no interictal epileptiform      activity in the form of spikes or sharp waves indicating normal      waking record.   CONSULTATIONS:  None.   BRIEF ADMITTING HISTORY AND PHYSICAL:  This is a 40 year old with  history of depression and prior admissions to behavioral health with  suicidal and homicidal ideation in addition to panic attacks and  generalized anxiety.  History of 2 seizures related withdrawal from  Xanax in May and June 2007.  He comes in after having a seizure today at  home.  He was apparently in his bathroom and fell down, hit his  head,  fell on his ribs on the side of his bathtub.  His mother and girlfriend  discovered him in the bathroom while he was vigorously having tonic-  clonic movements.  He woke up with significant confusion and EMS  transferred him to the ED.  He had a witness seizure in the ED after  receiving 2 mg of Ativan.  He was not incontinent of stool or urine at  that time.  He did have confusion at time of waking up after the second  seizure.   PHYSICAL EXAMINATION:  ADMITTING VITALS:  Temperature 98, blood pressure  143/95, pulse 84, respirations 18, and saturating 100% on room air.  GENERAL:  No acute distress.  EYES:  Extraocular eye movements are intact.  Anicteric.  ENT:  The patient has no teeth.  No oropharyngeal erythema or exudates.  NECK:  No JVD.  No cervical lymphadenopathy.  RESPIRATORY:  Lungs are clear to auscultation bilaterally.  The patient  is not using accessory muscles.  Good air movement bilaterally.  CARDIOVASCULAR:  Regular rate and rhythm.  No murmur, rubs, or gallops;  +2 radial pulses bilaterallywith +1posterior tibial pulses bilateral.  GI:  Soft, nontender, and  nondistended with good bowel sounds.  The  patient is tender along the upper right rib cage.  EXTREMITIES:  No erythematous.  No cyanosis or clubbing.  SKIN:  No rashes.  The patient does have a hematoma on the left forehead  that is tender to palpation, but only mildly fluctuant.  There is  minimal erythema surrounding it.  NEURO:  Cranial nerves II through XII are intact.  Muscle strength +5 in  all extremities, +2 DTRs in the lower extremities,+1 bilaterally in the  upper extremities.  The patient has normal Babinski.  PSYCHIATRY:  The patient is oriented x3.   ADMITTING LABS:  Sodium 138, potassium 3.5, chloride 106, bicarbonate  24, BUN 7, creatinine is 0.77.  UDS is positive for opiate and  benzodiazepines in addition to marijuana.  Alcohol level is less than 5.  Hemoglobin 13.4, white count 11, ANC 8.6, MCV 98, and RDW 14.   HOSPITAL COURSE:  1. Seizure.  The patient has a history of recurrent seizures secondary      to benzodiazepine withdrawal.  The patient has been taking Xanax      chronically over the last few months.  He recently increased his      intake of Xanax because of anxiety and stress at home.  He has been      running out of his pills earlier in the month than usual and has      been cutting back the frequncy of his doses. In the past 24-hour      period  he had not taken any Xanax at all.  He had seizure activity      at home that led to a rib fracture in addition to witnessed seizure      activity in the emergency department even after he received Ativan.      We obtained an EEG of the patient but the EEG was essentially      normal.  We placed the patient back on a longer acting form of      benzodiazepine.  He will likely needed to be slowly tapered off      benzodiazepines entirely.  I added an SSRI to his medical regimen      in hopes of helping him with some of his anxiety  problems.  He has      apparently tried SSRIs in the past with limited success.  He  may      require a trial of buspirone or another longer acting anti-anxiety      medication.  We arranged the patient to have follow up with Dr.      Mikey Bussing on January 22nd with regard to the patient's ongoing mental      issues.  He currently denies any suicidal or homicidal ideation.  2. Generalized anxiety disorder, see above.  Again, we arranged for      the patient to have followup with Dr. Mikey Bussing, his psychiatrist.      We also placed the patient on a longer acting form of      benzodiazepine to help him with his chronic anxiety problems in      addition to adding Lexapro.  We will see him in clinic and assess      how well these medications are working.  Again, he probably needs      to be weaned from his benzodiazepines givenhis history of overuse      and his history of seizures following prompt withdrawal from these      medications.  3. Polysubstance abuse.  The patient does have a history of abusing      marijuana and prescription drugs including opioids and      benzodiazepine.  This gives [place] an another reason to want to wean      these medications from his medical regimen.  We did prescribe      OxyContin and Percocet because the patient does have an acute rib      fracture on the right side.  These medications should ge given only      for a few weeks and again they should be tapered along with      benzodiazepines.  The patient has a long history of abusing these      medication and once his rib fracture heals, he no longer will need      these opioids.   DISCHARGE LABS:  Sodium 140, potassium 4, chloride 105, bicarbonates 30,  BUN 2, creatinine 0.74, and glucose 95.  Hemoglobin 13.6, white count  7.6, and platelets 219,000.   DISCHARGE VITALS:  Temperature 98.7, blood pressure 143/91, pulse 73,  respirations 27, and saturation 99% on room air.      Lollie Sails, MD  Electronically Signed     CB/MEDQ  D:  10/08/2007  T:  10/10/2007  Job:  [id number]

## 2011-01-05 NOTE — Discharge Summary (Signed)
NAME:  Eddie Williams, Eddie Williams NO.:  [id number]   MEDICAL RECORD NO.:  [id number]                   PATIENT TYPE:  IPS   LOCATION:  0508                                 FACILITY:  [facility]   PHYSICIAN:  Geoffery Lyons, M.D.                   DATE OF BIRTH:  [birth year]   DATE OF ADMISSION:  11/26/2003  DATE OF DISCHARGE:  11/29/2003                                 DISCHARGE SUMMARY   CHIEF COMPLAINT AND PRESENT ILLNESS:  This was the third admission to [facility] for this 40 year old divorced white male  involuntarily committed.  Has a history of depression with prior suicide  attempts.  Was seen as depressed with slurred speech with questionable  overdose.  Did take some Valium, Soma and pain pills.  Had got into a car.  His girlfriend got scared, said he was swerving.  She then took control of  the car and took him over to mental health.  Had thought he overdosed on his  medication.  He denied suicidal thoughts but endorsed that he needed some  help for his depression.  He did endorse that he takes an extra pain pill in  the morning for relief from his stiffness and pain.   PAST PSYCHIATRIC HISTORY:  Third time at [facility].  He was admitted  four years prior to this admission with a history of suicidal thoughts and  no suicide attempts or current outpatient treatment.   SUBSTANCE ABUSE HISTORY:  Denies any alcohol or substance abuse.   PAST MEDICAL HISTORY:  Degenerative disk disease.   MEDICATIONS:  Soma 50 mg four times a day, Lorcet 10\50 mg, 1 four times a  day, Valium as needed.   PHYSICAL EXAMINATION:  Performed and failed to show any acute findings.   LABORATORY DATA:  TSH was within normal limits.  Other laboratory workup was  within normal limits.   MENTAL STATUS EXAM:  Alert male.  Cooperative.  Good eye contact.  Walking  stiffly with a cane.  Speech was clear.  Stated that he was hurting.  Depressed.  Appeared to be in  some sort of distress.  Somewhat flat.  Thought processes are coherent.  No evidence of psychosis.  Cognition well-  preserved.   ADMISSION DIAGNOSES:   AXIS I:  Depressive disorder not otherwise specified.   AXIS II:  No diagnosis.   AXIS III:  Chronic back pain.   AXIS IV:  Moderate.   AXIS V:  Global Assessment of Functioning upon admission 35; highest Global  Assessment of Functioning in the last year 60.   HOSPITAL COURSE:  He was admitted and started intensive individual and group  psychotherapy.  He was given Ambien for sleep and he was given Neurontin 100  mg three times a day, Seroquel 50 mg every six hours as needed for anxiety.  He was given some Toradol  for the pain and he was kept on the Lorcet.  Neurontin was later discontinued and he was placed on Cymbalta 20 mg per  day.  On the unit, he remained somatically focused, complaining of pain,  also endorsed insomnia but he wanted to be discharged.  He felt that he was  ready to go home.  Felt that he would never try to hurt himself.  Denied any  suicidal or homicidal ideation.  He said that he had to go home and take  care of his children.  He communicated with his girlfriend and she was  comfortable with him being discharged.  She endorsed that she got scared  when he was driving and his speech was slurred and took him to mental  health.  They felt that he had a reaction to the medication.  He was willing  and motivated to pursue further outpatient treatment so we went ahead and  discharged to outpatient follow-up.   DISCHARGE DIAGNOSES:   AXIS I:  Depressive disorder not otherwise specified.   AXIS II:  No diagnosis.   AXIS III:  Back pain.   AXIS IV:  Moderate.   AXIS V:  Global Assessment of Functioning upon discharge 55-60.   DISCHARGE MEDICATIONS:  1. Soma 350 mg, 1 four times a day as needed.  2. Cymbalta 30 mg daily.  3. Ambien 10 mg at bedtime for sleep.  4. Seroquel 25 mg, 1-2 twice a day.  5.  Vicodin 10\650 mg every six hours as needed for pain.   FOLLOW UP:  Dr. Lolly Mustache at [facility].                                               Geoffery Lyons, M.D.    IL/MEDQ  D:  12/22/2003  T:  12/23/2003  Job:  [id number]

## 2011-01-05 NOTE — Procedures (Signed)
EEG NUMBER:  [id number].   HISTORY:  This is a 39 year old who had seizure-like episode and is having  an EEG done to evaluate for seizures.   CURRENT MEDICATIONS INCLUDE:  Percocet, Xanax, Requip, Lexapro and Relpax.   PROCEDURE:  Routine EEG,  Throughout this routine EEG there appears to be a posterior dominant rhythm  of 10-11 Hz activity at 20-30 microvolts. The background activity is fairly  symmetric mostly comprised of alpha range activity at 15-30 microvolts,  superimposed throughout the majority of the tracing as faster beta activity,  which is most likely from medication effect.  On photic stimulation there is  a symmetric photic driving response.  Hyperventilation was not performed  throughout this recording.  The patient did not go to sleep during this  tracing.  Throughout this record there is no evidence of electrographic  seizures or interictal discharge activity.   IMPRESSION:  This routine EEG is essentially within normal limits in the  awake state.  The presence of the beta in the background is highly  suggestive of medication effect.      Eddie Williams. Nash Shearer, M.D.  Electronically Signed     BMW:UXLK  D:  01/21/2006 13:38:25  T:  01/22/2006 09:31:00  Job #:  [id number]

## 2011-01-07 NOTE — Consult Note (Signed)
  NAME:  Eddie Williams, Eddie Williams               ACCOUNT NO.:  [id number]  MEDICAL RECORD NO.:  [id number]           PATIENT TYPE:  I  LOCATION:  1502                         FACILITY:  [facility]  PHYSICIAN:  Eulogio Ditch, MD DATE OF BIRTH:  06-15-1971  DATE OF CONSULTATION:  12/29/2010 DATE OF DISCHARGE:                                CONSULTATION   REASON FOR CONSULTATION:  Questionable overdose.  HISTORY OF PRESENT ILLNESS:  This is a 40 year old male who was brought to the hospital as he was found unresponsive at [facility].  The patient reported that he was with brother at [facility] and he took his regular Percocet, Valium, and Soma and then became unconscious.  The patient reported that it was not a suicide attempt.  He is never going to kill himself.  The patient has a long history of opioids dependency but he reported that currently he is taking Percocet regularly and he is not abusing it.  The patient also reported that during the free time he talks with his friends, help out in the neighborhood, go for a walk, or sometime goes to [employer] to read books.  The patient was very logical and goal directed during the interview.  No psychotic or manic symptoms reported or observed.  MENTAL STATUS EXAMINATION:  The patient is calm, cooperative during interview.  Fair eye contact.  No abnormal movements noticed.  Speech normal in rate, rhythm and volume.  Mood euthymic.  Affect mood congruent.  Thought process - logical and goal directed.  Thought content - not suicidal or homicidal, not delusional.  Thought perception - no audiovisual hallucination reported, not internally preoccupied. Cognition, alert, awake, oriented x3.  Memory, immediate, recent and remote fair.  Attention and concentration good.  Abstraction ability good.  Insight and judgment intact.  DIAGNOSIS:  AXIS I:  Chronic opioid dependency, currently under remission. AXIS II:  Deferred. AXIS III:  See medical notes. AXIS  IV:  None. AXIS V:  60.  RECOMMENDATIONS:  The patient can be discharged at this time as it was not an intentional overdose and the patient is not having any acute psychiatric symptoms.  Thanks for involving me in taking care of this patient.     Eulogio Ditch, MD     SA/MEDQ  D:  12/29/2010  T:  12/29/2010  Job:  [id number]  Electronically Signed by Eulogio Ditch  on 01/07/2011 12:15:36 PM

## 2011-01-09 NOTE — Discharge Summary (Signed)
NAME:  Eddie Williams, Eddie Williams               ACCOUNT NO.:  [id number]  MEDICAL RECORD NO.:  [id number]           PATIENT TYPE:  I  LOCATION:  1502                         FACILITY:  [facility]  PHYSICIAN:  Erick Blinks, MD     DATE OF BIRTH:  Jun 08, 1971  DATE OF ADMISSION:  12/28/2010 DATE OF DISCHARGE:  12/30/2010                              DISCHARGE SUMMARY   PRIMARY CARE PHYSICIAN:  Dr. Lerry Liner.  DISCHARGE DIAGNOSES: 1. Unintentional opioid overdose. 2. Altered mental status secondary to unintentional opioid overdose. 3. Hypertension secondary to unintentional opioid overdose, resolved. 4. Chronic back pain. 5. Degenerative disk disease. 6. Depression and anxiety. 7. History of seizures secondary to benzodiazepine withdrawal  DISCHARGE MEDICATIONS: 1. Percocet 10/325 mg 1 tablet p.o. q.12 h. p.r.n. 2. Valium 5 mg 1 tablet p.o. q.12 h. p.r.n.  ADMISSION HISTORY:  This is a 40 year old gentleman who has a history of chronic back pain, on Soma and Percocet.  The patient was found lethargic lying outside of [facility].  He was brought to the emergency room via EMS where he was found to be hypotensive and lethargic.  He was given a dose of Narcan, which improved his mental status as well as his blood pressure.  CT scan of the brain and spine were negative for any acute processes.  Tylenol level was within normal limits.  Alcohol level was negative and salicylate level was also negative.  The patient was admitted for further observation.  For further details, please refer to the history and physical dictated by Dr. Selena Batten on May 10th.  HOSPITAL COURSE: 1. Overdose.  The patient does not recall the events prior to passing     out.  He adamantly states that he did not take any extra     medication.  He has been taking his Percocet and his soma that has     been prescribed to him for many years now.  Only recent change to     his medication is that he was taken off Klonopin by his  psychiatrist and was placed on Valium and  he was taking Valium     twice a day.  The patient's presentation is extremely suggestive of     an opioid overdose.  He was seen in consultation by Dr. Rogers Blocker     from the psychiatric service and felt that the patient was not     suicidal, this overdose was not intentional, and he was cleared for     discharge from the psychiatric service.  This was further discussed     with the patient and his soma has been discontinued.  His Percocet,     it is recommended that he decrease it to 1 tablet twice a day as     opposed to his q.6 h. dosing and we will continue the Valium as     previously ordered in order to prevent any benzodiazepine     withdrawal seizures.  He will follow up with his psychiatrist on     Monday for further titration of his medication.  The patient is     otherwise  stable for discharge at this time.  CONSULTATIONS:  Psychiatry, Dr. Rogers Blocker.  PROCEDURES:  None.  DIAGNOSTIC IMAGING: 1. Chest x-ray on May 10th shows no acute chest findings. 2. CT C-spine shows normal cervical alignment, negative for fracture. 3. CT head is a negative head CT.  DISCHARGE INSTRUCTIONS:  The patient should follow up with his primary care physician next week.  He will follow up with his psychiatrist on Monday for further titration of his medicine.  He should continue on a regular diet, conduct his activity as tolerated.  Plan was discussed with the patient who is also in agreement.  CONDITION ON DISCHARGE:  Condition at time of discharge is improved.     Erick Blinks, MD     JM/MEDQ  D:  12/30/2010  T:  12/30/2010  Job:  [id number]  cc:   Dr. Lerry Liner  Electronically Signed by Durward Mallard Kaeo Jacome  on 01/09/2011 02:16:43 PM

## 2011-01-28 NOTE — H&P (Signed)
NAME:  Eddie Williams, Eddie Williams NO.:  [id number]  MEDICAL RECORD NO.:  [id number]           PATIENT TYPE:  E  LOCATION:  WLED                         FACILITY:  [facility]  PHYSICIAN:  Massie Maroon, MD        DATE OF BIRTH:  February 02, 1971  DATE OF ADMISSION:  12/28/2010 DATE OF DISCHARGE:                             HISTORY & PHYSICAL   CHIEF COMPLAINT:  ? Overdose.  HISTORY OF PRESENT ILLNESS:  A 40 year old male who is brought in for question of overdose.  He is apparently at [employer] sleeping.  He was brought in and urine drug screen shows positive for opiates, positive for benzodiazepines.  Tylenol level was within normal limits.  Alcohol level was negative.  Salicylate level negative.  Urinalysis negative. Renal function and liver function were intact.  Chest x-ray showed probable nipple shadows.  CT brain and C-spine were negative for any acute process though there was some evidence of chronic sinusitis.  The patient will be admitted for workup of altered mental status which is probably secondary to chronic pain medicines.  PAST MEDICAL HISTORY: 1. Chronic back pain. 2. Polysubstance abuse. 3. Degenerative disk disease. 4. Depression and anxiety. 5. Previous history of seizures secondary to benzodiazepine     withdrawal. 6. History of tricyclic antidepressant overdose in 2001. 7. History of pain medication overdose in 2005. 8. Hypertension.  ALLERGIES: 1. PENICILLIN. 2. ERYTHROMYCIN. 3. NSAIDs.  MEDICATIONS:  Soma, Valium, Percocet.  SOCIAL HISTORY:  The patient smokes cannabis.  He currently smokes about 1 pack per day for 20 years.  He does not drink.  He admits to [employer] use.  He is unemployed.  FAMILY HISTORY:  His mother had heart attack at age 23 and she was diabetic.  Father died at age 72 of unknown causes.  REVIEW OF SYSTEMS:  Negative for all 10-organ systems except for pertinent positives stated above.  PHYSICAL EXAMINATION:  VITAL SIGNS:   Temperature 99.1, pulse 60, blood pressure 108/73, pulse oximetry 100% on room air. HEENT:  Anicteric, EOMI, no nystagmus, pupils 1.5 mm, symmetric, direct, and consensual near reflexes intact.  Mucous membranes moist. NECK:  No JVD, no bruit, no thyromegaly, no adenopathy. HEART:  Regular rate and rhythm.  S1 and S2.  No murmurs, gallops, or rubs. LUNGS:  Clear to auscultation bilaterally. ABDOMEN:  Soft, nontender, nondistended.  Positive bowel sounds. EXTREMITIES:  No cyanosis, clubbing, or edema. SKIN:  No rashes. LYMPH NODES:  No adenopathy. NEUROLOGIC:  Nonfocal.  Cranial nerves II through XII intact.  Reflexes 2+, symmetric, diffuse with downgoing toes bilaterally. Motor strength 5/5 in all 4 extremities, pinprick intact.  LABORATORY DATA:  Salicylate level less than 2.  Alcohol level less than 11.  Sodium 140, potassium 3.5, BUN 18, creatinine 1.09.  AST 19, ALT 12. Tylenol level 15.2.  Urine drug screen benzodiazepine positive, opiate positive.  Urinalysis negative.  WBC 9.5, hemoglobin 14.0, platelet count 232.  ASSESSMENT AND PLAN: 1. Somnolence, likely secondary to chronic opioid use:  We will     observe the patient.  The patient will be placed on telemetry  floor. 2. Hypotension:  The patient will be hydrated with normal saline IV.     We will check serial cardiac markers.  This is most likely due to     opioid and benzodiazepine use.  Check a cortisol level to rule out     adrenal insufficiency, though this seems very unlikely. 3. Tobacco dependence:  The patient was counseled for 10 minutes on     smoking cessation. 4. Chronic back pain:  Tylenol and tramadol for pain for now. 5. Deep vein thrombosis prophylaxis.  SCDs.     Massie Maroon, MD     JYK/MEDQ  D:  12/28/2010  T:  12/28/2010  Job:  [id number]  Electronically Signed by Pearson Grippe MD on 01/28/2011 07:29:05 PM

## 2011-05-10 LAB — CARDIAC PANEL(CRET KIN+CKTOT+MB+TROPI)
CK, MB: 4.6 — ABNORMAL HIGH
Relative Index: 0.5
Troponin I: 0.02

## 2011-05-10 LAB — DIFFERENTIAL
Basophils Absolute: 0
Basophils Relative: 0
Lymphocytes Relative: 15
Neutro Abs: 8.6 — ABNORMAL HIGH
Neutrophils Relative %: 78 — ABNORMAL HIGH

## 2011-05-10 LAB — COMPREHENSIVE METABOLIC PANEL
Albumin: 3.8
BUN: 5 — ABNORMAL LOW
Calcium: 8.6
Chloride: 106
Creatinine, Ser: 0.77
Total Bilirubin: 0.5
Total Protein: 6.3

## 2011-05-10 LAB — CBC
HCT: 40.3
Hemoglobin: 13.1
MCHC: 33.7
MCHC: 33.9
MCV: 98.5
Platelets: 205
Platelets: 219
Platelets: 224
Platelets: 265
RBC: 4.09 — ABNORMAL LOW
RDW: 14
RDW: 14.1
WBC: 7.6

## 2011-05-10 LAB — TRICYCLIC ANTIDEPRESSANT EVALUATION
Desemethylclomipramine: <5
Desipramine: <5 mcg/L
Desmethyldoxepin: <5 mcg/L
Doxepin: <5 mcg/L
Imipram+Desipr Total: <5 mcg/L — ABNORMAL LOW (ref 150–300)
Imipramine Lvl: <5 mcg/L
Nortriptyline Lvl: <5 mcg/L

## 2011-05-10 LAB — ETHANOL: Alcohol, Ethyl (B): <5

## 2011-05-10 LAB — I-STAT 8, (EC8 V) (CONVERTED LAB)
Acid-base deficit: 1
Chloride: 106
HCT: 44
Hemoglobin: 15
Potassium: 3.9
Sodium: 138
TCO2: 25

## 2011-05-10 LAB — BASIC METABOLIC PANEL
BUN: 2 — ABNORMAL LOW
BUN: 5 — ABNORMAL LOW
CO2: 29
CO2: 30
Calcium: 9.2
Chloride: 102
Creatinine, Ser: 0.74
Creatinine, Ser: 0.78
GFR calc Af Amer: >60
GFR calc non Af Amer: >60
Glucose, Bld: 99
Potassium: 3.5
Sodium: 138

## 2011-05-10 LAB — MAGNESIUM: Magnesium: 1.8

## 2011-05-10 LAB — CK TOTAL AND CKMB (NOT AT ARMC)
CK, MB: 3.8
Relative Index: 0.8
Total CK: 476 — ABNORMAL HIGH

## 2011-05-10 LAB — PHOSPHORUS: Phosphorus: 3.3

## 2011-05-10 LAB — TROPONIN I: Troponin I: 0.02

## 2011-05-10 LAB — PROLACTIN: Prolactin: 2.8 (ref 2.1–17.1)

## 2011-05-22 LAB — POCT I-STAT, CHEM 8
BUN: 15
Calcium, Ion: 1.06 — ABNORMAL LOW
Chloride: 99
Creatinine, Ser: 1.2
Glucose, Bld: 86
HCT: 44
Hemoglobin: 15
Potassium: 4
Sodium: 137
TCO2: 30

## 2011-05-27 ENCOUNTER — Emergency Department (HOSPITAL_COMMUNITY)
Admission: EM | Admit: 2011-05-27 | Discharge: 2011-05-27 | Disposition: A | Discharge status: Home / Self Care | Payer: Medicare Other | Attending: Emergency Medicine | Admitting: Emergency Medicine

## 2011-05-27 ENCOUNTER — Emergency Department (HOSPITAL_COMMUNITY): Payer: Medicare Other

## 2011-05-27 DIAGNOSIS — R000 Tachycardia, unspecified: Secondary | ICD-10-CM | POA: Insufficient documentation

## 2011-05-27 DIAGNOSIS — R0682 Tachypnea, not elsewhere classified: Secondary | ICD-10-CM | POA: Insufficient documentation

## 2011-05-27 DIAGNOSIS — R748 Abnormal levels of other serum enzymes: Secondary | ICD-10-CM | POA: Insufficient documentation

## 2011-05-27 DIAGNOSIS — J189 Pneumonia, unspecified organism: Secondary | ICD-10-CM | POA: Insufficient documentation

## 2011-05-27 DIAGNOSIS — R0602 Shortness of breath: Secondary | ICD-10-CM | POA: Insufficient documentation

## 2011-05-27 DIAGNOSIS — R10816 Epigastric abdominal tenderness: Secondary | ICD-10-CM | POA: Insufficient documentation

## 2011-05-27 LAB — DIFFERENTIAL
Basophils Absolute: 0 10*3/uL (ref 0.0–0.1)
Basophils Relative: 0 % (ref 0–1)
Eosinophils Absolute: 0.2 10*3/uL (ref 0.0–0.7)
Monocytes Relative: 5 % (ref 3–12)
Neutro Abs: 13.2 10*3/uL — ABNORMAL HIGH (ref 1.7–7.7)
Neutrophils Relative %: 86 % — ABNORMAL HIGH (ref 43–77)

## 2011-05-27 LAB — HEPATIC FUNCTION PANEL
ALT: 25 U/L (ref 0–53)
AST: 67 U/L — ABNORMAL HIGH (ref 0–37)
Alkaline Phosphatase: 69 U/L (ref 39–117)
Bilirubin, Direct: 0.1 mg/dL (ref 0.0–0.3)
Indirect Bilirubin: 0.1 mg/dL — ABNORMAL LOW (ref 0.3–0.9)
Total Bilirubin: 0.2 mg/dL — ABNORMAL LOW (ref 0.3–1.2)

## 2011-05-27 LAB — POCT I-STAT, CHEM 8
BUN: 25 mg/dL — ABNORMAL HIGH (ref 6–23)
Calcium, Ion: 1.16 mmol/L (ref 1.12–1.32)
HCT: 39 % (ref 39.0–52.0)
Hemoglobin: 13.3 g/dL (ref 13.0–17.0)
Sodium: 140 mEq/L (ref 135–145)
TCO2: 28 mmol/L (ref 0–100)

## 2011-05-27 LAB — URINALYSIS, ROUTINE W REFLEX MICROSCOPIC
Glucose, UA: NEGATIVE mg/dL
Hgb urine dipstick: NEGATIVE
Ketones, ur: 15 mg/dL — AB
Protein, ur: NEGATIVE mg/dL
pH: 5.5 (ref 5.0–8.0)

## 2011-05-27 LAB — RAPID URINE DRUG SCREEN, HOSP PERFORMED
Amphetamines: NOT DETECTED
Benzodiazepines: POSITIVE — AB
Tetrahydrocannabinol: NOT DETECTED

## 2011-05-27 LAB — PROTIME-INR
INR: 1.06 (ref 0.00–1.49)
Prothrombin Time: 14 seconds (ref 11.6–15.2)

## 2011-05-27 LAB — LACTIC ACID, PLASMA: Lactic Acid, Venous: 0.8 mmol/L (ref 0.5–2.2)

## 2011-05-27 LAB — CBC
Hemoglobin: 13.6 g/dL (ref 13.0–17.0)
Platelets: 206 10*3/uL (ref 150–400)
RBC: 3.98 MIL/uL — ABNORMAL LOW (ref 4.22–5.81)
WBC: 15.3 10*3/uL — ABNORMAL HIGH (ref 4.0–10.5)

## 2011-05-31 LAB — COMPREHENSIVE METABOLIC PANEL
ALT: 17
AST: 17
Albumin: 3.9
Alkaline Phosphatase: 53
BUN: 12
CO2: 30
Calcium: 9.6
Chloride: 103
Creatinine, Ser: 0.86
GFR calc Af Amer: >60
GFR calc non Af Amer: >60
Glucose, Bld: 88
Potassium: 4.7
Sodium: 140
Total Bilirubin: 0.5
Total Protein: 6.8

## 2011-05-31 LAB — URINALYSIS, ROUTINE W REFLEX MICROSCOPIC
Bilirubin Urine: NEGATIVE
Glucose, UA: NEGATIVE
Hgb urine dipstick: NEGATIVE
Ketones, ur: NEGATIVE
Nitrite: NEGATIVE
Protein, ur: NEGATIVE
Specific Gravity, Urine: 1.014
Urobilinogen, UA: 0.2
pH: 6

## 2011-05-31 LAB — BENZODIAZEPINE, QUANTITATIVE, URINE
Nordiazepam GC/MS Conf: NEGATIVE
Oxazepam GC/MS Conf: NEGATIVE

## 2011-05-31 LAB — CBC
MCHC: 34.7
MCV: 95.9
Platelets: 293
RBC: 4.42
WBC: 9.3

## 2011-05-31 LAB — DRUGS OF ABUSE SCREEN W/O ALC, ROUTINE URINE
Amphetamine Screen, Ur: NEGATIVE
Barbiturate Quant, Ur: NEGATIVE
Marijuana Metabolite: NEGATIVE
Propoxyphene: NEGATIVE

## 2011-06-05 LAB — COMPREHENSIVE METABOLIC PANEL
ALT: 14
Alkaline Phosphatase: 54
BUN: 5 — ABNORMAL LOW
Chloride: 99
Glucose, Bld: 97
Potassium: 3.9
Sodium: 137
Total Bilirubin: 0.4
Total Protein: 6.9

## 2011-06-05 LAB — CBC
HCT: 39.4
Hemoglobin: 13.3
RBC: 4.21 — ABNORMAL LOW
RDW: 12.8
WBC: 7.7

## 2011-06-05 LAB — TSH: TSH: 2.05

## 2011-06-07 LAB — DIFFERENTIAL
Basophils Absolute: 0
Basophils Relative: 0
Eosinophils Relative: 0
Monocytes Absolute: 0.7
Neutro Abs: 19.6 — ABNORMAL HIGH

## 2011-06-07 LAB — I-STAT 8, (EC8 V) (CONVERTED LAB)
BUN: 11
Bicarbonate: 25.4 — ABNORMAL HIGH
Glucose, Bld: 129 — ABNORMAL HIGH
pCO2, Ven: 35.3 — ABNORMAL LOW

## 2011-06-07 LAB — CBC
HCT: 37.6 — ABNORMAL LOW
Hemoglobin: 12.9 — ABNORMAL LOW
MCHC: 34.2
Platelets: 214
RDW: 13.2

## 2011-06-07 LAB — ROCKY MTN SPOTTED FVR AB, IGG-BLOOD: RMSF IgG: 0.13 {ISR}

## 2011-06-07 LAB — POCT I-STAT CREATININE: Creatinine, Ser: 1.1

## 2011-07-19 ENCOUNTER — Other Ambulatory Visit: Payer: Self-pay | Admitting: Geriatric Medicine

## 2011-07-19 DIAGNOSIS — R1011 Right upper quadrant pain: Secondary | ICD-10-CM

## 2011-07-20 ENCOUNTER — Other Ambulatory Visit: Payer: Medicare Other

## 2011-08-16 ENCOUNTER — Encounter (HOSPITAL_COMMUNITY): Payer: Self-pay | Admitting: Emergency Medicine

## 2011-08-16 ENCOUNTER — Other Ambulatory Visit: Payer: Self-pay

## 2011-08-16 ENCOUNTER — Emergency Department (HOSPITAL_COMMUNITY)
Admission: EM | Admit: 2011-08-16 | Discharge: 2011-08-16 | Disposition: A | Discharge status: Home / Self Care | Payer: Medicare Other | Attending: Emergency Medicine | Admitting: Emergency Medicine

## 2011-08-16 ENCOUNTER — Encounter: Payer: Self-pay | Admitting: Emergency Medicine

## 2011-08-16 ENCOUNTER — Emergency Department (HOSPITAL_COMMUNITY): Payer: Medicare Other

## 2011-08-16 DIAGNOSIS — M542 Cervicalgia: Secondary | ICD-10-CM | POA: Insufficient documentation

## 2011-08-16 DIAGNOSIS — R569 Unspecified convulsions: Secondary | ICD-10-CM

## 2011-08-16 DIAGNOSIS — G40909 Epilepsy, unspecified, not intractable, without status epilepticus: Secondary | ICD-10-CM | POA: Insufficient documentation

## 2011-08-16 DIAGNOSIS — M545 Low back pain, unspecified: Secondary | ICD-10-CM | POA: Insufficient documentation

## 2011-08-16 DIAGNOSIS — G8929 Other chronic pain: Secondary | ICD-10-CM | POA: Insufficient documentation

## 2011-08-16 DIAGNOSIS — R296 Repeated falls: Secondary | ICD-10-CM | POA: Insufficient documentation

## 2011-08-16 DIAGNOSIS — Z79899 Other long term (current) drug therapy: Secondary | ICD-10-CM | POA: Insufficient documentation

## 2011-08-16 DIAGNOSIS — M549 Dorsalgia, unspecified: Secondary | ICD-10-CM | POA: Insufficient documentation

## 2011-08-16 HISTORY — DX: Dorsalgia, unspecified: M54.9

## 2011-08-16 HISTORY — DX: Other chronic pain: G89.29

## 2011-08-16 HISTORY — DX: Unspecified convulsions: R56.9

## 2011-08-16 HISTORY — DX: Anxiety disorder, unspecified: F41.9

## 2011-08-16 MED ORDER — DIAZEPAM 5 MG PO TABS
10.0000 mg | ORAL_TABLET | Freq: Once | ORAL | Status: AC
  Administered 2011-08-16: 10 mg via ORAL
  Filled 2011-08-16: qty 2

## 2011-08-16 MED ORDER — DIAZEPAM 5 MG PO TABS: 5.0000 mg | ORAL_TABLET | Freq: Two times a day (BID) | ORAL | Status: DC

## 2011-08-16 MED ORDER — LORAZEPAM 1 MG PO TABS: 1.0000 mg | ORAL_TABLET | Freq: Once | ORAL | Status: DC

## 2011-08-16 MED ORDER — DIAZEPAM 5 MG PO TABS
10.0000 mg | ORAL_TABLET | Freq: Once | ORAL | Status: AC
  Administered 2011-08-16: 10 mg via ORAL
  Filled 2011-08-16 (×2): qty 1

## 2011-08-16 NOTE — ED Provider Notes (Signed)
History    this is a 40 year old gentleman with history of benzos withdrawal seizure is present in the ED with a chief complaint of seizure. Patient states he was sitting on his couch when he had an apparent seizure episode this morning. It was a witnessed seizure by his brother.  His seizure episode lasting for a few minutes. He was in a postictal state but has improved. He denies hitting his head, tongue biting, urinary incontinence, chest pain, shortness of breath, nausea, vomiting, diarrhea, abdominal pain, dysuria, rash. He admits to taking Valium 10 mg twice daily. He has been taking more than usual several days before and has ran out of his medication. Has not been taking Valium for the past 2 days. He does not take anything specifically for his seizure. His last seizure episode was one year ago. He has never been seen by a neurologist. He denies any trauma from a seizure episode.  CSN: [id number]  Arrival date & time 08/16/11  1144   First MD Initiated Contact with Patient 08/16/11 1237      Chief Complaint  Patient presents with  . Seizures    (Consider location/radiation/quality/duration/timing/severity/associated sxs/prior treatment) HPI  Past Medical History  Diagnosis Date  . Anxiety   . Chronic back pain   . Seizures     No past surgical history on file.  No family history on file.  History  Substance Use Topics  . Smoking status: Not on file  . Smokeless tobacco: Not on file  . Alcohol Use:       Review of Systems  All other systems reviewed and are negative.    Allergies  Penicillins; Ibuprofen; and Erythromycin  Home Medications   Current Outpatient Rx  Name Route Sig Dispense Refill  . ATENOLOL-CHLORTHALIDONE 50-25 MG PO TABS Oral Take 1 tablet by mouth daily.      Marland Kitchen CARISOPRODOL 350 MG PO TABS Oral Take 350 mg by mouth 3 (three) times daily as needed. For pain.    Marland Kitchen DIAZEPAM 10 MG PO TABS Oral Take 10 mg by mouth 2 (two) times daily.      Marland Kitchen  HYDROCODONE-ACETAMINOPHEN 10-500 MG PO TABS Oral Take 1 tablet by mouth 4 (four) times daily.      Marland Kitchen PROMETHAZINE HCL 25 MG PO TABS Oral Take 25 mg by mouth every 6 (six) hours as needed. For nausea     . SERTRALINE HCL 100 MG PO TABS Oral Take 100 mg by mouth daily.        BP 133/97  Pulse 95  Temp(Src) 99.6 F (37.6 C) (Oral)  Resp 17  SpO2 98%  Physical Exam  Nursing note and vitals reviewed. Constitutional: He is oriented to person, place, and time. He appears well-developed and well-nourished.       Awake, alert, nontoxic appearance  HENT:  Head: Normocephalic and atraumatic.  Mouth/Throat: Oropharynx is clear and moist.  Eyes: Conjunctivae and EOM are normal. Pupils are equal, round, and reactive to light. Right eye exhibits no discharge. Left eye exhibits no discharge. No scleral icterus.  Neck: Normal range of motion. Neck supple.  Cardiovascular: Normal rate and regular rhythm.   Pulmonary/Chest: Effort normal. No respiratory distress. He has no wheezes. He has no rales. He exhibits no tenderness.  Abdominal: Soft. Bowel sounds are normal. There is no tenderness. There is no rebound.  Musculoskeletal: Normal range of motion. He exhibits no edema and no tenderness.       Baseline ROM, no obvious new  focal weakness  Neurological: He is alert and oriented to person, place, and time.       Mental status and motor strength appears baseline for patient and situation  Skin: Skin is warm and dry. No rash noted.  Psychiatric: He has a normal mood and affect.    ED Course  Procedures (including critical care time)  Labs Reviewed - No data to display No results found.   No diagnosis found.   Date: 08/16/2011  Rate: 94  Rhythm: normal sinus rhythm  QRS Axis: normal  Intervals: normal  ST/T Wave abnormalities: normal  Conduction Disutrbances:none  Narrative Interpretation:   Old EKG Reviewed: none available    MDM  Patient's seizure is likely from benzos withdrawal.  He is back to his normal baseline. He has no obvious trauma. He is alert and oriented. He has no focal neuro deficit. His vital signs stable. He denies any recent sickness. Therefore, valium PO given.  I will continue patient care.          Fayrene Helper, PA 08/16/11 1454

## 2011-08-16 NOTE — ED Notes (Signed)
Pt given discharge instructions and verbalizes understanding Pt provided with free bus card

## 2011-08-16 NOTE — ED Notes (Addendum)
Pt found by brother with seizure-like activity. No incontinence or post ictal state observed by EMS. Pt reportedly ran out of valium 2 days ago which he takes for depression. bbg 131. 18 R wrist. VSS per EMS. Pt states to this RN he has had seizures in the past.

## 2011-08-16 NOTE — ED Provider Notes (Signed)
Chief Complaint  Patient presents with  . Seizures    Per EMS- Pt. was seen at [facility] ED for a seizure this AM was treated and told to follow up. Pt.'s brother reported a second seizure that lasted approx. 5 minutes, but really was unable to describe. pt. wa postitcal upon EMS arrival. pt. c/o neck and back pain. pt. was placed on a LSB, head, blocks, and collar.     (Consider location/radiation/quality/duration/timing/severity/associated sxs/prior treatment) Seizures   Patient is a 40 y.o. male presenting with seizures.   Per EMS- Pt. was seen at [facility] ED for a seizure this AM was treated and told to follow up. Pt.'s brother reported a second seizure that lasted approx. 5 minutes, but really was unable to describe. pt. wa postitcal upon EMS arrival. pt. c/o neck and back pain. pt. was placed on a LSB, head, blocks, and collar.   Past Medical History  Diagnosis Date  . Anxiety   . Chronic back pain   . Seizures     History reviewed. No pertinent past surgical history.  History reviewed. No pertinent family history.  History  Substance Use Topics  . Smoking status: Current Everyday Smoker  . Smokeless tobacco: Not on file  . Alcohol Use: No      Review of Systems  Neurological: Positive for seizures.  All other systems reviewed and are negative.    Allergies  Penicillins; Ibuprofen; and Erythromycin  Home Medications   Current Outpatient Rx  Name Route Sig Dispense Refill  . ATENOLOL-CHLORTHALIDONE 50-25 MG PO TABS Oral Take 1 tablet by mouth daily.      Marland Kitchen CARISOPRODOL 350 MG PO TABS Oral Take 350 mg by mouth 3 (three) times daily as needed. For pain.    Marland Kitchen DIAZEPAM 10 MG PO TABS Oral Take 10 mg by mouth 2 (two) times daily.      Marland Kitchen HYDROCODONE-ACETAMINOPHEN 10-500 MG PO TABS Oral Take 1 tablet by mouth 4 (four) times daily as needed. For pain.    Marland Kitchen PROMETHAZINE HCL 25 MG PO TABS Oral Take 25 mg by mouth every 6 (six) hours as needed. For nausea     . RANITIDINE  HCL 150 MG PO TABS Oral Take 150 mg by mouth daily.      . SERTRALINE HCL 100 MG PO TABS Oral Take 100 mg by mouth daily.        BP 134/84  Pulse 99  Temp(Src) 99.1 F (37.3 C) (Oral)  Resp 16  SpO2 100%  Physical Exam  Nursing note and vitals reviewed. Constitutional: He is oriented to person, place, and time. He appears well-developed and well-nourished.       Awake, alert, nontoxic appearance  HENT:  Head: Normocephalic and atraumatic.  Mouth/Throat: Oropharynx is clear and moist.  Eyes: Conjunctivae and EOM are normal. Pupils are equal, round, and reactive to light. Right eye exhibits no discharge. Left eye exhibits no discharge. No scleral icterus.  Neck: No JVD present.  Cardiovascular: Normal rate and regular rhythm.   Pulmonary/Chest: Effort normal. No stridor. No respiratory distress. He has no wheezes. He has no rales. He exhibits no tenderness.  Abdominal: Soft. Bowel sounds are normal. There is no tenderness. There is no rebound.  Musculoskeletal: Normal range of motion. He exhibits no edema and no tenderness.       Cervical back: He exhibits tenderness.       Baseline ROM, no obvious new focal weakness  Neurological: He is alert and  oriented to person, place, and time.       Mental status and motor strength appears baseline for patient and situation  Skin: Skin is warm and dry. No rash noted.  Psychiatric: He has a normal mood and affect.    ED Course  Procedures  (including critical care time) Patient given 10 mg of by mouth Valium in the emergency department.  Patient has a prescription which is when picked up in the morning. Labs Reviewed - No data to display Dg Cervical Spine Complete  08/16/2011  *RADIOLOGY REPORT*  Clinical Data: Seizure, fall and neck pain.  CERVICAL SPINE - COMPLETE 4+ VIEW  Comparison: CT of cervical spine dated 12/28/2010  Findings: The cervical vertebral bodies show normal alignment.  No evidence of fracture or subluxation.  No  significant degenerative changes.  No focal soft tissue swelling.  IMPRESSION: Normal cervical spine.  Original Report Authenticated By: Reola Calkins, M.D.   Dg Lumbar Spine Complete  08/16/2011  *RADIOLOGY REPORT*  Clinical Data: Low back pain.  Status post seizure  LUMBAR SPINE - COMPLETE 4+ VIEW  Comparison: 05/31/2009  Findings: Normal alignment of the lumbar spine.  The vertebral body heights are well preserved.  Mild disc space narrowing and ventral spurring noted at L4-5 and L5- S1.  No fractures or subluxations identified.  No radiopaque foreign bodies or soft tissue calcifications.  IMPRESSION:  1.  Mild degenerative disc disease. 2.  No acute findings.  Original Report Authenticated By: Rosealee Albee, M.D.     1. Seizure         MDM   08/16/11 1454  Nelia Shi, MD 08/16/11 2039

## 2011-08-16 NOTE — ED Notes (Signed)
Per EMS- Pt. Was seen at [facility] ED for a seizure. Pt. Was treated and told to follow up. This afternoon, pt.'s brother reported a seizure that lasted approx. 5 minutes, but was unable to describe. Pt. Was postitcal upon EMS arrival, but did c/o neck and back pain.
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
## 2011-08-16 NOTE — ED Provider Notes (Signed)
Medical screening examination/treatment/procedure(s) were performed by non-physician practitioner and as supervising physician I was immediately available for consultation/collaboration.  Naomy Esham Y.   Gavin Pound. Oletta Lamas, MD 08/16/11 [phone]

## 2012-06-20 ENCOUNTER — Ambulatory Visit: Payer: Medicare Other | Admitting: Internal Medicine

## 2012-07-23 ENCOUNTER — Encounter: Payer: Medicare Other | Admitting: Internal Medicine

## 2012-07-23 NOTE — Progress Notes (Deleted)
.  kf Patient has no care team.   HPI  Eddie Williams is a 41 y.o. male   Past Medical History  Diagnosis Date  . Anxiety   . Chronic back pain   . Seizures     No past surgical history on file.  Current Outpatient Prescriptions  Medication Sig Dispense Refill  . atenolol-chlorthalidone (TENORETIC) 50-25 MG per tablet Take 1 tablet by mouth daily.        . carisoprodol (SOMA) 350 MG tablet Take 350 mg by mouth 3 (three) times daily as needed. For pain.      . diazepam (VALIUM) 10 MG tablet Take 10 mg by mouth 2 (two) times daily.        Marland Kitchen HYDROcodone-acetaminophen (LORTAB) 10-500 MG per tablet Take 1 tablet by mouth 4 (four) times daily as needed. For pain.      . promethazine (PHENERGAN) 25 MG tablet Take 25 mg by mouth every 6 (six) hours as needed. For nausea       . ranitidine (ZANTAC) 150 MG tablet Take 150 mg by mouth daily.        . sertraline (ZOLOFT) 100 MG tablet Take 100 mg by mouth daily.          Allergies  Allergen Reactions  . Penicillins Anaphylaxis  . Ibuprofen Nausea Only  . Erythromycin Rash    Review of Systems negative except from HPI and PMH  Physical Exam There were no vitals taken for this visit. Well developed and well nourished in no acute distress HENT normal E scleral and icterus clear Neck Supple JVP flat; carotids brisk and full Clear to ausculation  Regular rate and rhythm, no murmurs gallops or rub Soft with active bowel sounds No clubbing cyanosis {Numbers; edema:[id number]} Edema Alert and oriented, grossly normal motor and sensory function Skin Warm and Dry    Assessment and  Plan

## 2012-09-03 ENCOUNTER — Encounter: Payer: Self-pay | Admitting: Internal Medicine

## 2012-10-09 ENCOUNTER — Encounter (HOSPITAL_COMMUNITY): Payer: Self-pay

## 2012-10-09 ENCOUNTER — Emergency Department (INDEPENDENT_AMBULATORY_CARE_PROVIDER_SITE_OTHER)
Admission: EM | Admit: 2012-10-09 | Discharge: 2012-10-09 | Disposition: A | Discharge status: Home / Self Care | Payer: Medicare Other | Source: Home / Self Care | Attending: Family Medicine | Admitting: Family Medicine

## 2012-10-09 DIAGNOSIS — I10 Essential (primary) hypertension: Secondary | ICD-10-CM

## 2012-10-09 NOTE — ED Notes (Signed)
Patient states here for medication refill Was terminated from the practice he was in With Dr Quintella Reichert History of back surgery

## 2012-10-09 NOTE — ED Provider Notes (Signed)
History     CSN: [id number]  Arrival date & time 10/09/12  1406   First MD Initiated Contact with Patient 10/09/12 1521      Chief Complaint  Patient presents with  . Medication Refill    HPI Pt says that he was told by his prior PCP that he was dismissed with no notice and no reason to explain why this happened.  He says he was seen by this provider for over 15 years.  He was seen by Dr. Arrie Aran.  He says that he was being treated for hypertension and chronic back pain.  He says that he needs to have his pain meds refilled.  He says that he has his prescriptions for his blood pressure meds.  He is also taking benzodiazepines for an anxiety disorder.  He says that he is being followed by a psychiatrist who is prescribing his benzos and psychiatric medications.    Past Medical History  Diagnosis Date  . Anxiety   . Chronic back pain   . Seizures     Past Surgical History  Procedure Laterality Date  . Back surgery      No family history on file.  History  Substance Use Topics  . Smoking status: Current Every Day Smoker  . Smokeless tobacco: Not on file  . Alcohol Use: No    Review of Systems  Musculoskeletal: Positive for back pain.  Psychiatric/Behavioral: The patient is nervous/anxious.   All other systems reviewed and are negative.    Allergies  Penicillins; Ibuprofen; and Erythromycin  Home Medications   Current Outpatient Rx  Name  Route  Sig  Dispense  Refill  . atenolol-chlorthalidone (TENORETIC) 50-25 MG per tablet   Oral   Take 1 tablet by mouth daily.           . carisoprodol (SOMA) 350 MG tablet   Oral   Take 350 mg by mouth 3 (three) times daily as needed. For pain.         . diazepam (VALIUM) 10 MG tablet   Oral   Take 10 mg by mouth 2 (two) times daily.           Marland Kitchen HYDROcodone-acetaminophen (LORTAB) 10-500 MG per tablet   Oral   Take 1 tablet by mouth 4 (four) times daily as needed. For pain.         . promethazine (PHENERGAN)  25 MG tablet   Oral   Take 25 mg by mouth every 6 (six) hours as needed. For nausea          . ranitidine (ZANTAC) 150 MG tablet   Oral   Take 150 mg by mouth daily.           . sertraline (ZOLOFT) 100 MG tablet   Oral   Take 100 mg by mouth daily.             BP 123/91  Pulse 67  Temp(Src) 98.1 F (36.7 C) (Oral)  SpO2 98%  Physical Exam  Nursing note and vitals reviewed. Constitutional: He is oriented to person, place, and time. He appears well-developed and well-nourished. No distress.  HENT:  Head: Normocephalic and atraumatic.  Eyes: Conjunctivae and EOM are normal. Pupils are equal, round, and reactive to light.  Neck: Normal range of motion. Neck supple. No JVD present. No thyromegaly present.  Cardiovascular: Normal rate and regular rhythm.   Pulmonary/Chest: Effort normal.  Abdominal: Soft. He exhibits no distension.  Musculoskeletal: Normal range of motion.  Lymphadenopathy:    He has no cervical adenopathy.  Neurological: He is alert and oriented to person, place, and time.  Skin: Skin is warm and dry. No pallor.  Psychiatric: He has a normal mood and affect. His behavior is normal. Judgment and thought content normal.    ED Course  Procedures (including critical care time)  Labs Reviewed - No data to display No results found.   1. GERD (gastroesophageal reflux disease)   2. Hypertension   3. Allergic rhinitis    MDM  IMPRESSION RECOMMENDATIONS / PLAN I explained to patient that we would be happy to work with him for his primary care needs but would not serve as a clinic for his chronic pain management.  I told him that we needed to get his medical records, labs, imaging, etc.  I asked him to sign a release so that I can review his medical records.  He has been a patient with the [facility] in the past and he reports that he decided to leave the clinic.  I asked him to contact them and find out if they would be willing to see them  again.  The patient says that he wants to find a private PCP because he has medical insurance.  He says that he still has some of his pain med prescription left but will run out soon.  He says that he has his clonazepam prescription as he is getting this from another provider (his behavioral health specialist). His blood pressure seems to be reasonably controlled on his current medications.  The patient verbalized understanding.  FOLLOW UP 3 months for HTN follow up if pt has not already established with a private PCP  The patient was given clear instructions to go to ER or return to medical center if symptoms don't improve, worsen or new problems develop.  The patient verbalized understanding.  The patient was told to call to get lab results if they haven't heard anything in the next week.            Cleora Fleet, MD 10/09/12 [phone]

## 2012-11-30 ENCOUNTER — Emergency Department (HOSPITAL_COMMUNITY): Payer: Medicare Other

## 2012-11-30 ENCOUNTER — Encounter (HOSPITAL_COMMUNITY): Payer: Self-pay

## 2012-11-30 ENCOUNTER — Emergency Department (HOSPITAL_COMMUNITY)
Admission: EM | Admit: 2012-11-30 | Discharge: 2012-11-30 | Disposition: A | Discharge status: Home / Self Care | Payer: Medicare Other | Attending: Emergency Medicine | Admitting: Emergency Medicine

## 2012-11-30 DIAGNOSIS — S4980XA Other specified injuries of shoulder and upper arm, unspecified arm, initial encounter: Secondary | ICD-10-CM | POA: Insufficient documentation

## 2012-11-30 DIAGNOSIS — S46909A Unspecified injury of unspecified muscle, fascia and tendon at shoulder and upper arm level, unspecified arm, initial encounter: Secondary | ICD-10-CM | POA: Insufficient documentation

## 2012-11-30 DIAGNOSIS — IMO0002 Reserved for concepts with insufficient information to code with codable children: Secondary | ICD-10-CM | POA: Insufficient documentation

## 2012-11-30 DIAGNOSIS — S60229A Contusion of unspecified hand, initial encounter: Secondary | ICD-10-CM | POA: Insufficient documentation

## 2012-11-30 DIAGNOSIS — S60511A Abrasion of right hand, initial encounter: Secondary | ICD-10-CM

## 2012-11-30 DIAGNOSIS — Z79899 Other long term (current) drug therapy: Secondary | ICD-10-CM | POA: Insufficient documentation

## 2012-11-30 DIAGNOSIS — F411 Generalized anxiety disorder: Secondary | ICD-10-CM | POA: Insufficient documentation

## 2012-11-30 DIAGNOSIS — S60221A Contusion of right hand, initial encounter: Secondary | ICD-10-CM

## 2012-11-30 DIAGNOSIS — Z8669 Personal history of other diseases of the nervous system and sense organs: Secondary | ICD-10-CM | POA: Insufficient documentation

## 2012-11-30 DIAGNOSIS — M25511 Pain in right shoulder: Secondary | ICD-10-CM

## 2012-11-30 DIAGNOSIS — G8929 Other chronic pain: Secondary | ICD-10-CM | POA: Insufficient documentation

## 2012-11-30 DIAGNOSIS — F17200 Nicotine dependence, unspecified, uncomplicated: Secondary | ICD-10-CM | POA: Insufficient documentation

## 2012-11-30 MED ORDER — HYDROCODONE-ACETAMINOPHEN 5-325 MG PO TABS
2.0000 | ORAL_TABLET | Freq: Once | ORAL | Status: AC
  Administered 2012-11-30: 2 via ORAL
  Filled 2012-11-30: qty 2

## 2012-11-30 MED ORDER — HYDROCODONE-ACETAMINOPHEN 5-325 MG PO TABS: 1.0000 | ORAL_TABLET | Freq: Four times a day (QID) | ORAL | Status: DC | PRN
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
## 2013-05-27 ENCOUNTER — Emergency Department (HOSPITAL_COMMUNITY)
Admission: EM | Admit: 2013-05-27 | Discharge: 2013-05-27 | Disposition: A | Discharge status: Home / Self Care | Payer: Medicare Other | Attending: Emergency Medicine | Admitting: Emergency Medicine

## 2013-05-27 ENCOUNTER — Encounter (HOSPITAL_COMMUNITY): Payer: Self-pay | Admitting: Emergency Medicine

## 2013-05-27 ENCOUNTER — Emergency Department (HOSPITAL_COMMUNITY): Payer: Medicare Other

## 2013-05-27 DIAGNOSIS — M25519 Pain in unspecified shoulder: Secondary | ICD-10-CM | POA: Insufficient documentation

## 2013-05-27 DIAGNOSIS — H6120 Impacted cerumen, unspecified ear: Secondary | ICD-10-CM | POA: Insufficient documentation

## 2013-05-27 DIAGNOSIS — G8929 Other chronic pain: Secondary | ICD-10-CM | POA: Insufficient documentation

## 2013-05-27 DIAGNOSIS — F17200 Nicotine dependence, unspecified, uncomplicated: Secondary | ICD-10-CM | POA: Insufficient documentation

## 2013-05-27 DIAGNOSIS — J40 Bronchitis, not specified as acute or chronic: Secondary | ICD-10-CM

## 2013-05-27 DIAGNOSIS — F411 Generalized anxiety disorder: Secondary | ICD-10-CM | POA: Insufficient documentation

## 2013-05-27 DIAGNOSIS — Z880 Allergy status to penicillin: Secondary | ICD-10-CM | POA: Insufficient documentation

## 2013-05-27 DIAGNOSIS — G40909 Epilepsy, unspecified, not intractable, without status epilepticus: Secondary | ICD-10-CM | POA: Insufficient documentation

## 2013-05-27 DIAGNOSIS — J209 Acute bronchitis, unspecified: Secondary | ICD-10-CM | POA: Insufficient documentation

## 2013-05-27 DIAGNOSIS — Z79899 Other long term (current) drug therapy: Secondary | ICD-10-CM | POA: Insufficient documentation

## 2013-05-27 DIAGNOSIS — M549 Dorsalgia, unspecified: Secondary | ICD-10-CM | POA: Insufficient documentation

## 2013-05-27 MED ORDER — IPRATROPIUM BROMIDE 0.02 % IN SOLN
0.5000 mg | RESPIRATORY_TRACT | Status: DC
  Administered 2013-05-27: 0.5 mg via RESPIRATORY_TRACT
  Filled 2013-05-27: qty 2.5

## 2013-05-27 MED ORDER — BENZONATATE 100 MG PO CAPS
100.0000 mg | ORAL_CAPSULE | Freq: Once | ORAL | Status: AC
  Administered 2013-05-27: 100 mg via ORAL
  Filled 2013-05-27: qty 1

## 2013-05-27 MED ORDER — ALBUTEROL SULFATE (5 MG/ML) 0.5% IN NEBU
2.5000 mg | INHALATION_SOLUTION | RESPIRATORY_TRACT | Status: DC
  Administered 2013-05-27: 2.5 mg via RESPIRATORY_TRACT
  Filled 2013-05-27: qty 0.5

## 2013-05-27 MED ORDER — ONDANSETRON 4 MG PO TBDP
4.0000 mg | ORAL_TABLET | Freq: Once | ORAL | Status: AC
  Administered 2013-05-27: 4 mg via ORAL
  Filled 2013-05-27: qty 1

## 2013-05-27 NOTE — ED Provider Notes (Signed)
CSN: [id number]     Arrival date & time 05/27/13  4696 History   First MD Initiated Contact with Patient 05/27/13 [phone]     Chief Complaint  Patient presents with  . Cough  . Shoulder Pain   (Consider location/radiation/quality/duration/timing/severity/associated sxs/prior Treatment) Patient is a 42 y.o. male presenting with cough and shoulder pain. The history is provided by the patient.  Cough Cough characteristics:  Productive Sputum characteristics:  Yellow Severity:  Mild Onset quality:  Gradual Duration:  2 weeks Timing:  Constant Progression:  Unchanged Chronicity:  New Smoker: no   Context: not sick contacts and not smoke exposure   Relieved by:  Nothing Worsened by:  Nothing tried Associated symptoms: no chest pain, no fever and no shortness of breath   Shoulder Pain Pertinent negatives include no chest pain and no shortness of breath.    Past Medical History  Diagnosis Date  . Anxiety   . Chronic back pain   . Seizures    Past Surgical History  Procedure Laterality Date  . Back surgery     No family history on file. History  Substance Use Topics  . Smoking status: Current Every Day Smoker    Types: Cigarettes  . Smokeless tobacco: Not on file  . Alcohol Use: No    Review of Systems  Constitutional: Negative for fever.  Respiratory: Positive for cough. Negative for shortness of breath.   Cardiovascular: Negative for chest pain and leg swelling.  All other systems reviewed and are negative.    Allergies  Penicillins; Ibuprofen; and Erythromycin  Home Medications   Current Outpatient Rx  Name  Route  Sig  Dispense  Refill  . atenolol-chlorthalidone (TENORETIC) 50-25 MG per tablet   Oral   Take 1 tablet by mouth daily.          . clonazePAM (KLONOPIN) 1 MG tablet   Oral   Take 1 mg by mouth 2 (two) times daily as needed for anxiety.         Marland Kitchen lisinopril (PRINIVIL,ZESTRIL) 5 MG tablet   Oral   Take 5 mg by mouth daily.         Marland Kitchen  loratadine (CLARITIN) 10 MG tablet   Oral   Take 10 mg by mouth daily.         Marland Kitchen omeprazole (PRILOSEC) 40 MG capsule   Oral   Take 40 mg by mouth daily.         . sertraline (ZOLOFT) 100 MG tablet   Oral   Take 100 mg by mouth daily.           BP 120/72  Pulse 83  Temp(Src) 98.3 F (36.8 C) (Oral)  Resp 20  SpO2 99% Physical Exam  Constitutional: He is oriented to person, place, and time. He appears well-developed and well-nourished. No distress.  HENT:  Head: Normocephalic and atraumatic.  Right Ear: Tympanic membrane normal.  Nose: Right sinus exhibits no maxillary sinus tenderness and no frontal sinus tenderness. Left sinus exhibits no maxillary sinus tenderness and no frontal sinus tenderness.  Mouth/Throat: No oropharyngeal exudate.  L ear canal obstructed with cerumen  Eyes: EOM are normal. Pupils are equal, round, and reactive to light.  Neck: Normal range of motion. Neck supple.  Cardiovascular: Normal rate and regular rhythm.  Exam reveals no friction rub.   No murmur heard. Pulmonary/Chest: Effort normal and breath sounds normal. No respiratory distress. He has no wheezes. He has no rales.  Abdominal: He exhibits no  distension. There is no tenderness. There is no rebound.  Musculoskeletal: Normal range of motion. He exhibits no edema.  Neurological: He is alert and oriented to person, place, and time.  Skin: He is not diaphoretic.    ED Course  Procedures (including critical care time) Labs Review Labs Reviewed - No data to display Imaging Review Dg Chest 2 View  05/27/2013   CLINICAL DATA:  Cough  EXAM: CHEST  2 VIEW  COMPARISON:  05/27/2011  FINDINGS: Lungs are clear. Possible trace right pleural effusion. No pneumothorax.  The heart is normal in size.  Visualized osseous structures are within normal limits.  IMPRESSION: Possible trace right pleural effusion.   Electronically Signed   By: Charline Bills M.D.   On: 05/27/2013 08:09    MDM   1.  Bronchitis    60M with 1-2 weeks of URI symptoms. Productive cough, sinus pressure. Cough has occasional productivity which is yellow. Subjective fevers, none today. He is a smoker. AFVSS here. Lungs clear, O2 sats 100% with good waveform on room air. Normal sinus rhythm on the monitor, no respiratory distress. Likely bronchitis, will get CXR to look for pneumonia. CXR clear, reviewed by me. Stable for discharge with bronchitis. Given PCP f/u resource guide.    Dagmar Hait, MD 05/27/13 [phone]

## 2013-05-27 NOTE — ED Notes (Signed)
Pt c/o coughing til he vomits, c/o nausea

## 2013-05-27 NOTE — ED Notes (Signed)
Pt c/o cough/chest congestion x2wks, c/o rt shoulder pain with no new injury

## 2013-08-23 ENCOUNTER — Emergency Department (HOSPITAL_COMMUNITY)
Admission: EM | Admit: 2013-08-23 | Discharge: 2013-08-23 | Disposition: A | Discharge status: Home / Self Care | Payer: Medicare Other | Attending: Emergency Medicine | Admitting: Emergency Medicine

## 2013-08-23 ENCOUNTER — Encounter (HOSPITAL_COMMUNITY): Payer: Self-pay | Admitting: Emergency Medicine

## 2013-08-23 DIAGNOSIS — F17200 Nicotine dependence, unspecified, uncomplicated: Secondary | ICD-10-CM | POA: Insufficient documentation

## 2013-08-23 DIAGNOSIS — J029 Acute pharyngitis, unspecified: Secondary | ICD-10-CM | POA: Insufficient documentation

## 2013-08-23 DIAGNOSIS — J209 Acute bronchitis, unspecified: Secondary | ICD-10-CM | POA: Insufficient documentation

## 2013-08-23 DIAGNOSIS — G8929 Other chronic pain: Secondary | ICD-10-CM | POA: Insufficient documentation

## 2013-08-23 DIAGNOSIS — J440 Chronic obstructive pulmonary disease with acute lower respiratory infection: Secondary | ICD-10-CM | POA: Insufficient documentation

## 2013-08-23 DIAGNOSIS — R0989 Other specified symptoms and signs involving the circulatory and respiratory systems: Secondary | ICD-10-CM | POA: Insufficient documentation

## 2013-08-23 DIAGNOSIS — IMO0001 Reserved for inherently not codable concepts without codable children: Secondary | ICD-10-CM | POA: Insufficient documentation

## 2013-08-23 DIAGNOSIS — R6883 Chills (without fever): Secondary | ICD-10-CM | POA: Insufficient documentation

## 2013-08-23 DIAGNOSIS — Z8709 Personal history of other diseases of the respiratory system: Secondary | ICD-10-CM

## 2013-08-23 DIAGNOSIS — Z880 Allergy status to penicillin: Secondary | ICD-10-CM | POA: Insufficient documentation

## 2013-08-23 DIAGNOSIS — Z79899 Other long term (current) drug therapy: Secondary | ICD-10-CM | POA: Insufficient documentation

## 2013-08-23 DIAGNOSIS — J3489 Other specified disorders of nose and nasal sinuses: Secondary | ICD-10-CM | POA: Insufficient documentation

## 2013-08-23 DIAGNOSIS — F411 Generalized anxiety disorder: Secondary | ICD-10-CM | POA: Insufficient documentation

## 2013-08-23 DIAGNOSIS — R599 Enlarged lymph nodes, unspecified: Secondary | ICD-10-CM | POA: Insufficient documentation

## 2013-08-23 DIAGNOSIS — J40 Bronchitis, not specified as acute or chronic: Secondary | ICD-10-CM

## 2013-08-23 DIAGNOSIS — G40909 Epilepsy, unspecified, not intractable, without status epilepticus: Secondary | ICD-10-CM | POA: Insufficient documentation

## 2013-08-23 DIAGNOSIS — M549 Dorsalgia, unspecified: Secondary | ICD-10-CM | POA: Insufficient documentation

## 2013-08-23 DIAGNOSIS — I10 Essential (primary) hypertension: Secondary | ICD-10-CM | POA: Insufficient documentation

## 2013-08-23 HISTORY — DX: Chronic obstructive pulmonary disease, unspecified: J44.9

## 2013-08-23 HISTORY — DX: Essential (primary) hypertension: I10

## 2013-08-23 MED ORDER — PREDNISONE 20 MG PO TABS: 60.0000 mg | ORAL_TABLET | Freq: Every day | ORAL | Status: DC

## 2013-08-23 MED ORDER — LEVOFLOXACIN 500 MG PO TABS: 500.0000 mg | ORAL_TABLET | Freq: Every day | ORAL | Status: DC

## 2013-08-23 NOTE — Discharge Instructions (Signed)
Bronchitis °Bronchitis is a problem of the air tubes leading to your lungs. This problem makes it hard for air to get in and out of the lungs. You may cough a lot because your air tubes are narrow. Going without care can cause lasting (chronic) bronchitis. °HOME CARE  °· Drink enough fluids to keep your pee (urine) clear or pale yellow. °· Use a cool mist humidifier. °· Quit smoking if you smoke. If you keep smoking, the bronchitis might not get better. °· Only take medicine as told by your doctor. °GET HELP RIGHT AWAY IF:  °· Coughing keeps you awake. °· You start to wheeze. °· You become more sick or weak. °· You have a hard time breathing or get short of breath. °· You cough up blood. °· Coughing lasts more than 2 weeks. °· You have a fever. °· Your baby is older than 3 months with a rectal temperature of 102° F (38.9° C) or higher. °· Your baby is 3 months old or younger with a rectal temperature of 100.4° F (38° C) or higher. °MAKE SURE YOU: °· Understand these instructions. °· Will watch your condition. °· Will get help right away if you are not doing well or get worse. °Document Released: 01/23/2008 Document Revised: 10/29/2011 Document Reviewed: 03/31/2013 °[employer]® Patient Information ©2014 [employer]. ° °

## 2013-08-23 NOTE — ED Provider Notes (Signed)
Medical screening examination/treatment/procedure(s) were performed by non-physician practitioner and as supervising physician I was immediately available for consultation/collaboration.  EKG Interpretation    Date/Time:  Sunday August 23 2013 10:16:09 EST Ventricular Rate:  72 PR Interval:  140 QRS Duration: 95 QT Interval:  377 QTC Calculation: 412 R Axis:   120 Text Interpretation:  Sinus rhythm Biatrial enlargement Right axis deviation Borderline low voltage, extremity leads since last tracing no significant change Confirmed by Effie ShyWENTZ  MD, ELLIOTT ([id number]) on 08/23/2013 10:21:21 AM             Raeford RazorStephen Manroop Jakubowicz, MD 08/23/13 [id number]

## 2013-08-23 NOTE — ED Provider Notes (Signed)
CSN: [id number]     Arrival date & time 08/23/13  [id number] History   First MD Initiated Contact with Patient 08/23/13 1017     Chief Complaint  Patient presents with  . Cough  . Migraine   (Consider location/radiation/quality/duration/timing/severity/associated sxs/prior Treatment) Patient is a 43 y.o. male presenting with cough and migraines. The history is provided by the patient. No language interpreter was used.  Cough Cough characteristics:  Productive Severity:  Moderate Smoker: yes   Associated symptoms: chills, myalgias and sore throat   Associated symptoms: no chest pain, no fever, no rash and no shortness of breath   Associated symptoms comment:  Cough, congestion, sore throat and body aches with chills for the past 4 days. He reports a history of COPD. He also complains of back pain worse than chronic back pain that is treated through pain management. No dysuria, vomiting. NO difficulty swallowing. Migraine Associated symptoms include chills, congestion, coughing, myalgias and a sore throat. Pertinent negatives include no abdominal pain, chest pain, fever, rash or vomiting.    Past Medical History  Diagnosis Date  . Anxiety   . Chronic back pain   . Seizures   . COPD (chronic obstructive pulmonary disease)   . Hypertension    Past Surgical History  Procedure Laterality Date  . Back surgery     History reviewed. No pertinent family history. History  Substance Use Topics  . Smoking status: Current Every Day Smoker    Types: Cigarettes  . Smokeless tobacco: Not on file  . Alcohol Use: No    Review of Systems  Constitutional: Positive for chills. Negative for fever.  HENT: Positive for congestion, sinus pressure and sore throat.   Respiratory: Positive for cough and chest tightness. Negative for shortness of breath.   Cardiovascular: Negative.  Negative for chest pain.  Gastrointestinal: Negative.  Negative for vomiting and abdominal pain.  Genitourinary: Negative.    Musculoskeletal: Positive for back pain and myalgias.  Skin: Negative.  Negative for rash.  Neurological: Negative.     Allergies  Penicillins; Ibuprofen; and Erythromycin  Home Medications   Current Outpatient Rx  Name  Route  Sig  Dispense  Refill  . atenolol-chlorthalidone (TENORETIC) 50-25 MG per tablet   Oral   Take 1 tablet by mouth daily.          . clonazePAM (KLONOPIN) 1 MG tablet   Oral   Take 1 mg by mouth 2 (two) times daily as needed for anxiety.         Marland Kitchen. lisinopril (PRINIVIL,ZESTRIL) 5 MG tablet   Oral   Take 5 mg by mouth daily.         Marland Kitchen. loratadine (CLARITIN) 10 MG tablet   Oral   Take 10 mg by mouth daily.         Marland Kitchen. omeprazole (PRILOSEC) 40 MG capsule   Oral   Take 40 mg by mouth daily.         . sertraline (ZOLOFT) 100 MG tablet   Oral   Take 100 mg by mouth daily.           BP 92/63  Pulse 74  Temp(Src) 98.2 F (36.8 C) (Oral)  Resp 20  SpO2 98% Physical Exam  Constitutional: He is oriented to person, place, and time. He appears well-developed and well-nourished. No distress.  HENT:  Head: Normocephalic.  Oropharynx is red without tonsillar swelling or exudates. Uvula midline.  Neck: Normal range of motion. Neck supple.  Cardiovascular: Normal  rate and regular rhythm.   Pulmonary/Chest: Effort normal. He has rales. He exhibits no tenderness.  Abdominal: Soft. Bowel sounds are normal. There is no tenderness. There is no rebound and no guarding.  Musculoskeletal: Normal range of motion.  Lymphadenopathy:    He has cervical adenopathy.  Neurological: He is alert and oriented to person, place, and time.  Skin: Skin is warm and dry. No rash noted.  Psychiatric: He has a normal mood and affect.    ED Course  Procedures (including critical care time) Labs Review Labs Reviewed - No data to display Imaging Review No results found.  EKG Interpretation    Date/Time:  Sunday August 23 2013 10:16:09 EST Ventricular Rate:   72 PR Interval:  140 QRS Duration: 95 QT Interval:  377 QTC Calculation: 412 R Axis:   120 Text Interpretation:  Sinus rhythm Biatrial enlargement Right axis deviation Borderline low voltage, extremity leads since last tracing no significant change Confirmed by WENTZ  MD, ELLIOTT ([id number]) on 08/23/2013 10:21:21 AM            MDM  No diagnosis found. 1. Bronchitis 2. H/o COPD  Patient with stable vital signs. No hypoxia, no wheezing. Will treat based on history of COPD. Recommend f/u with pain management for back symptoms, but suggested chronic pain is exacerbated by body aches of current illness.    Arnoldo Hooker, PA-C 08/23/13 1037

## 2013-08-23 NOTE — ED Notes (Signed)
Pt states he has cough, sore throat and chest tightness for past 4 days. HX of COPD. Pt also complains of migraine. Pt speaking in complete sentences.

## 2013-09-17 ENCOUNTER — Emergency Department (HOSPITAL_COMMUNITY): Payer: Medicare Other

## 2013-09-17 ENCOUNTER — Encounter (HOSPITAL_COMMUNITY): Payer: Self-pay | Admitting: Emergency Medicine

## 2013-09-17 ENCOUNTER — Emergency Department (HOSPITAL_COMMUNITY)
Admission: EM | Admit: 2013-09-17 | Discharge: 2013-09-17 | Disposition: A | Discharge status: Home / Self Care | Payer: Medicare Other | Attending: Emergency Medicine | Admitting: Emergency Medicine

## 2013-09-17 DIAGNOSIS — Z880 Allergy status to penicillin: Secondary | ICD-10-CM | POA: Insufficient documentation

## 2013-09-17 DIAGNOSIS — I10 Essential (primary) hypertension: Secondary | ICD-10-CM | POA: Insufficient documentation

## 2013-09-17 DIAGNOSIS — G40909 Epilepsy, unspecified, not intractable, without status epilepticus: Secondary | ICD-10-CM | POA: Insufficient documentation

## 2013-09-17 DIAGNOSIS — J4489 Other specified chronic obstructive pulmonary disease: Secondary | ICD-10-CM | POA: Insufficient documentation

## 2013-09-17 DIAGNOSIS — M545 Low back pain, unspecified: Secondary | ICD-10-CM | POA: Insufficient documentation

## 2013-09-17 DIAGNOSIS — Z8739 Personal history of other diseases of the musculoskeletal system and connective tissue: Secondary | ICD-10-CM | POA: Insufficient documentation

## 2013-09-17 DIAGNOSIS — G8929 Other chronic pain: Secondary | ICD-10-CM | POA: Insufficient documentation

## 2013-09-17 DIAGNOSIS — Y929 Unspecified place or not applicable: Secondary | ICD-10-CM | POA: Insufficient documentation

## 2013-09-17 DIAGNOSIS — F17200 Nicotine dependence, unspecified, uncomplicated: Secondary | ICD-10-CM | POA: Insufficient documentation

## 2013-09-17 DIAGNOSIS — J449 Chronic obstructive pulmonary disease, unspecified: Secondary | ICD-10-CM | POA: Insufficient documentation

## 2013-09-17 DIAGNOSIS — W1809XA Striking against other object with subsequent fall, initial encounter: Secondary | ICD-10-CM | POA: Insufficient documentation

## 2013-09-17 DIAGNOSIS — Y9389 Activity, other specified: Secondary | ICD-10-CM | POA: Insufficient documentation

## 2013-09-17 DIAGNOSIS — S20219A Contusion of unspecified front wall of thorax, initial encounter: Secondary | ICD-10-CM | POA: Insufficient documentation

## 2013-09-17 DIAGNOSIS — Z79899 Other long term (current) drug therapy: Secondary | ICD-10-CM | POA: Insufficient documentation

## 2013-09-17 DIAGNOSIS — S20229A Contusion of unspecified back wall of thorax, initial encounter: Secondary | ICD-10-CM

## 2013-09-17 DIAGNOSIS — F411 Generalized anxiety disorder: Secondary | ICD-10-CM | POA: Insufficient documentation

## 2013-09-17 MED ORDER — KETOROLAC TROMETHAMINE 60 MG/2ML IM SOLN
60.0000 mg | Freq: Once | INTRAMUSCULAR | Status: AC
  Administered 2013-09-17: 60 mg via INTRAMUSCULAR
  Filled 2013-09-17: qty 2

## 2013-09-17 MED ORDER — HYDROCODONE-ACETAMINOPHEN 5-325 MG PO TABS: 1.0000 | ORAL_TABLET | Freq: Once | ORAL | Status: DC

## 2013-09-17 NOTE — Discharge Instructions (Signed)
Continue your current medications. Try heating pad on your back. No heavy lifting, kneeling, squatting. Follow up with your doctor.    Back Pain, Adult Low back pain is very common. About 1 in 5 people have back pain.The cause of low back pain is rarely dangerous. The pain often gets better over time.About half of people with a sudden onset of back pain feel better in just 2 weeks. About 8 in 10 people feel better by 6 weeks.  CAUSES Some common causes of back pain include:  Strain of the muscles or ligaments supporting the spine.  Wear and tear (degeneration) of the spinal discs.  Arthritis.  Direct injury to the back. DIAGNOSIS Most of the time, the direct cause of low back pain is not known.However, back pain can be treated effectively even when the exact cause of the pain is unknown.Answering your caregiver's questions about your overall health and symptoms is one of the most accurate ways to make sure the cause of your pain is not dangerous. If your caregiver needs more information, he or she may order lab work or imaging tests (X-rays or MRIs).However, even if imaging tests show changes in your back, this usually does not require surgery. HOME CARE INSTRUCTIONS For many people, back pain returns.Since low back pain is rarely dangerous, it is often a condition that people can learn to [facility]manageon their own.   Remain active. It is stressful on the back to sit or stand in one place. Do not sit, drive, or stand in one place for more than 30 minutes at a time. Take short walks on level surfaces as soon as pain allows.Try to increase the length of time you walk each day.  Do not stay in bed.Resting more than 1 or 2 days can delay your recovery.  Do not avoid exercise or work.Your body is made to move.It is not dangerous to be active, even though your back may hurt.Your back will likely heal faster if you return to being active before your pain is gone.  Pay attention to your body  when you bend and lift. Many people have less discomfortwhen lifting if they bend their knees, keep the load close to their bodies,and avoid twisting. Often, the most comfortable positions are those that put less stress on your recovering back.  Find a comfortable position to sleep. Use a firm mattress and lie on your side with your knees slightly bent. If you lie on your back, put a pillow under your knees.  Only take over-the-counter or prescription medicines as directed by your caregiver. Over-the-counter medicines to reduce pain and inflammation are often the most helpful.Your caregiver may prescribe muscle relaxant drugs.These medicines help dull your pain so you can more quickly return to your normal activities and healthy exercise.  Put ice on the injured area.  Put ice in a plastic bag.  Place a towel between your skin and the bag.  Leave the ice on for 15-20 minutes, 03-04 times a day for the first 2 to 3 days. After that, ice and heat may be alternated to reduce pain and spasms.  Ask your caregiver about trying back exercises and gentle massage. This may be of some benefit.  Avoid feeling anxious or stressed.Stress increases muscle tension and can worsen back pain.It is important to recognize when you are anxious or stressed and learn ways to manage it.Exercise is a great option. SEEK MEDICAL CARE IF:  You have pain that is not relieved with rest or medicine.  You have pain that does not improve in 1 week.  You have new symptoms.  You are generally not feeling well. SEEK IMMEDIATE MEDICAL CARE IF:   You have pain that radiates from your back into your legs.  You develop new bowel or bladder control problems.  You have unusual weakness or numbness in your arms or legs.  You develop nausea or vomiting.  You develop abdominal pain.  You feel faint. Document Released: 08/06/2005 Document Revised: 02/05/2012 Document Reviewed: 12/25/2010 [facility] Patient  Information 2014 [place], [place].

## 2013-09-17 NOTE — ED Provider Notes (Signed)
Medical screening examination/treatment/procedure(s) were performed by non-physician practitioner and as supervising physician I was immediately available for consultation/collaboration.  EKG Interpretation   None         Megan E Docherty, MD 09/17/13 1745 
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
## 2013-10-01 ENCOUNTER — Emergency Department (HOSPITAL_COMMUNITY): Payer: Medicare Other

## 2013-10-01 ENCOUNTER — Encounter (HOSPITAL_COMMUNITY): Payer: Self-pay | Admitting: Emergency Medicine

## 2013-10-01 ENCOUNTER — Emergency Department (HOSPITAL_COMMUNITY)
Admission: EM | Admit: 2013-10-01 | Discharge: 2013-10-01 | Disposition: A | Discharge status: Home / Self Care | Payer: Medicare Other | Attending: Emergency Medicine | Admitting: Emergency Medicine

## 2013-10-01 DIAGNOSIS — H538 Other visual disturbances: Secondary | ICD-10-CM | POA: Insufficient documentation

## 2013-10-01 DIAGNOSIS — H53149 Visual discomfort, unspecified: Secondary | ICD-10-CM | POA: Insufficient documentation

## 2013-10-01 DIAGNOSIS — Z79899 Other long term (current) drug therapy: Secondary | ICD-10-CM | POA: Insufficient documentation

## 2013-10-01 DIAGNOSIS — J449 Chronic obstructive pulmonary disease, unspecified: Secondary | ICD-10-CM | POA: Insufficient documentation

## 2013-10-01 DIAGNOSIS — H05019 Cellulitis of unspecified orbit: Secondary | ICD-10-CM | POA: Insufficient documentation

## 2013-10-01 DIAGNOSIS — J4489 Other specified chronic obstructive pulmonary disease: Secondary | ICD-10-CM | POA: Insufficient documentation

## 2013-10-01 DIAGNOSIS — Z880 Allergy status to penicillin: Secondary | ICD-10-CM | POA: Insufficient documentation

## 2013-10-01 DIAGNOSIS — R51 Headache: Secondary | ICD-10-CM | POA: Insufficient documentation

## 2013-10-01 DIAGNOSIS — G8929 Other chronic pain: Secondary | ICD-10-CM | POA: Insufficient documentation

## 2013-10-01 DIAGNOSIS — R6883 Chills (without fever): Secondary | ICD-10-CM | POA: Insufficient documentation

## 2013-10-01 DIAGNOSIS — I10 Essential (primary) hypertension: Secondary | ICD-10-CM | POA: Insufficient documentation

## 2013-10-01 DIAGNOSIS — R079 Chest pain, unspecified: Secondary | ICD-10-CM | POA: Insufficient documentation

## 2013-10-01 DIAGNOSIS — R5381 Other malaise: Secondary | ICD-10-CM | POA: Insufficient documentation

## 2013-10-01 DIAGNOSIS — F411 Generalized anxiety disorder: Secondary | ICD-10-CM | POA: Insufficient documentation

## 2013-10-01 DIAGNOSIS — R5383 Other fatigue: Secondary | ICD-10-CM

## 2013-10-01 DIAGNOSIS — G40909 Epilepsy, unspecified, not intractable, without status epilepticus: Secondary | ICD-10-CM | POA: Insufficient documentation

## 2013-10-01 DIAGNOSIS — F17200 Nicotine dependence, unspecified, uncomplicated: Secondary | ICD-10-CM | POA: Insufficient documentation

## 2013-10-01 DIAGNOSIS — R110 Nausea: Secondary | ICD-10-CM | POA: Insufficient documentation

## 2013-10-01 DIAGNOSIS — L03213 Periorbital cellulitis: Secondary | ICD-10-CM

## 2013-10-01 MED ORDER — CLINDAMYCIN HCL 300 MG PO CAPS
300.0000 mg | ORAL_CAPSULE | Freq: Once | ORAL | Status: AC
  Administered 2013-10-01: 300 mg via ORAL
  Filled 2013-10-01: qty 1

## 2013-10-01 MED ORDER — OXYCODONE-ACETAMINOPHEN 5-325 MG PO TABS
2.0000 | ORAL_TABLET | Freq: Once | ORAL | Status: AC
Start: 2013-10-01 — End: 2013-10-01
  Administered 2013-10-01: 2 via ORAL
  Filled 2013-10-01: qty 2

## 2013-10-01 MED ORDER — CLINDAMYCIN HCL 150 MG PO CAPS: 300.0000 mg | ORAL_CAPSULE | Freq: Three times a day (TID) | ORAL | Status: DC

## 2013-10-01 NOTE — ED Notes (Signed)
Pt c/o pain/swelling right eye x 9 days; states warm compresses not helping; c/o headache; draining and matting

## 2013-10-01 NOTE — ED Provider Notes (Signed)
CSN: [id number]     Arrival date & time 10/01/13  1244 History  This chart was scribed for non-physician practitioner working with Shanna CiscoMegan E Docherty, MD by Ashley JacobsBrittany Andrews, ED scribe. This patient was seen in room WTR6/WTR6 and the patient's care was started at 1:24 PM.  First MD Initiated Contact with Patient 10/01/13 1320     Chief Complaint  Patient presents with  . Eye Pain     (Consider location/radiation/quality/duration/timing/severity/associated sxs/prior Treatment) Patient is a 43 y.o. male presenting with eye pain. The history is provided by the patient and medical records. No language interpreter was used.  Eye Pain Associated symptoms include chest pain and headaches.   HPI Comments: Eddie Williams is a 43 y.o. male who presents to the Emergency Department complaining of right eye pain for the past nine days. He has the associated symptoms of severe headache, eye drainage, blurred vision, rhinnorhea, and mucus matting.  He mentions it feels as though a finger is in his eye. Pt has nausea, chest pain, and chills. Pt has a severe migraine that is worse than the prior episodes. He has tried hot compresses six times a day which does not relieve the pain. Pt is unable to sleep long periods of time due to pain.  Denies vomiting. He had appointement at his pain management but mentions that he did not go due to feeling "drained". Past Medical History  Diagnosis Date  . Anxiety   . Chronic back pain   . Seizures   . COPD (chronic obstructive pulmonary disease)   . Hypertension    Past Surgical History  Procedure Laterality Date  . Back surgery     No family history on file. History  Substance Use Topics  . Smoking status: Current Every Day Smoker    Types: Cigarettes  . Smokeless tobacco: Not on file  . Alcohol Use: No    Review of Systems  Constitutional: Positive for chills, activity change and fatigue.  HENT: Positive for rhinorrhea.   Eyes: Positive for photophobia,  pain, discharge, redness and visual disturbance.  Cardiovascular: Positive for chest pain.  Gastrointestinal: Positive for nausea. Negative for vomiting and diarrhea.  Neurological: Positive for headaches.  Psychiatric/Behavioral: Positive for sleep disturbance.  All other systems reviewed and are negative.      Allergies  Penicillins; Ibuprofen; and Erythromycin  Home Medications   Current Outpatient Rx  Name  Route  Sig  Dispense  Refill  . amphetamine-dextroamphetamine (ADDERALL XR) 20 MG 24 hr capsule   Oral   Take 20 mg by mouth daily.         Marland Kitchen. atenolol-chlorthalidone (TENORETIC) 50-25 MG per tablet   Oral   Take 1 tablet by mouth daily.          Marland Kitchen. buPROPion (WELLBUTRIN XL) 300 MG 24 hr tablet   Oral   Take 300 mg by mouth daily.         . clonazePAM (KLONOPIN) 1 MG tablet   Oral   Take 1 mg by mouth 4 (four) times daily as needed for anxiety.          . diphenhydrAMINE (BENADRYL) 25 mg capsule   Oral   Take 25 mg by mouth 2 (two) times daily as needed for allergies.         Marland Kitchen. lisinopril (PRINIVIL,ZESTRIL) 5 MG tablet   Oral   Take 5 mg by mouth daily.         Marland Kitchen. loratadine (CLARITIN) 10 MG tablet  Oral   Take 10 mg by mouth daily.         Marland Kitchen omeprazole (PRILOSEC) 40 MG capsule   Oral   Take 40 mg by mouth daily.         Marland Kitchen oxyCODONE-acetaminophen (PERCOCET) 10-325 MG per tablet   Oral   Take 1 tablet by mouth every 8 (eight) hours as needed for pain.         Marland Kitchen sertraline (ZOLOFT) 100 MG tablet   Oral   Take 100 mg by mouth daily.          Marland Kitchen zolpidem (AMBIEN) 10 MG tablet   Oral   Take 10 mg by mouth at bedtime as needed for sleep.          BP 149/94  Pulse 88  Temp(Src) 98.4 F (36.9 C) (Oral)  Resp 20  SpO2 98% Physical Exam  Nursing note and vitals reviewed. Constitutional: He is oriented to person, place, and time. He appears well-developed and well-nourished. No distress.  HENT:  Head: Normocephalic and  atraumatic.  Right Ear: External ear normal.  Left Ear: External ear normal.  Mouth/Throat: Oropharynx is clear and moist. No oropharyngeal exudate.  No lesions present    Eyes: Conjunctivae and EOM are normal. Pupils are equal, round, and reactive to light. Right eye exhibits no discharge. Left eye exhibits discharge. No scleral icterus.  purulent discharge to the right eye Diffuse conjunctival injection of the right lower eyelid without induration  Neck: Normal range of motion. Neck supple. No tracheal deviation present.  Cardiovascular: Normal rate.   Pulmonary/Chest: Effort normal. No respiratory distress.  Abdominal: Soft. He exhibits no distension.  Musculoskeletal: Normal range of motion.  Neurological: He is alert and oriented to person, place, and time.  Skin: Skin is warm and dry.  Psychiatric: He has a normal mood and affect. His behavior is normal.    ED Course  Procedures (including critical care time) DIAGNOSTIC STUDIES: Oxygen Saturation is 98% on room air, normal by my interpretation.    COORDINATION OF CARE:  1:30 PM Discussed course of care with pt . Pt understands and agrees.   Labs Review Labs Reviewed - No data to display Imaging Review Ct Maxillofacial Wo Cm  10/01/2013   CLINICAL DATA:  Right eye swelling and pain  EXAM: CT MAXILLOFACIAL WITHOUT CONTRAST  TECHNIQUE: Multidetector CT imaging of the maxillofacial structures was performed. Multiplanar CT image reconstructions were also generated. A small metallic BB was placed on the right temple in order to reliably differentiate right from left.  COMPARISON:  CT HEAD W/O CM dated 12/28/2010  FINDINGS: There is opacification of most of the left maxillary sinus along with opacification of multiple ethmoid air cells. There is chronic bilateral maxillary sinusitis.  The patient is edentulous.  No facial fracture observed.  No significant abnormal intraorbital abnormality. Extraocular muscles appear normal.  Infraorbital preseptal soft tissue swelling on the right tracks caudad anterior to the right maxilla.  IMPRESSION: 1. Abnormal right infraorbital soft tissue swelling. Periorbital cellulitis is not excluded. No intraorbital abnormality. 2. Chronic left frontal, bilateral ethmoid, and bilateral maxillary sinusitis.   Electronically Signed   By: Herbie Baltimore M.D.   On: 10/01/2013 15:06    EKG Interpretation   None       MDM  I personally performed the services described in this documentation, which was scribed in my presence. The recorded information has been reviewed and is accurate.   Final diagnoses:  Periorbital cellulitis of  right eye    3:32 PM Patient's CT shows periorbital cellulitis. Patient will have Clindamycin for infection. Patient advised to return with worsening or concerning symptoms. Vitals stable and patient afebrile. Patient will not have pain prescription due to pain contract.     Emilia Beck, PA-C 10/01/13 1535

## 2013-10-01 NOTE — Progress Notes (Signed)
   CARE MANAGEMENT ED NOTE 10/01/2013  Patient:  Eddie BattlesSOLOMON,Tynan T   Account Number:  [id number]  Date Initiated:  10/01/2013  Documentation initiated by:  Edd ArbourGIBBS,KIMBERLY  Subjective/Objective Assessment:   43 yr old medicare pt who states his pcp is Fleet ContrasEdwin Avbuere     Subjective/Objective Assessment Detail:     Action/Plan:   Action/Plan Detail:   EPIC updated   Anticipated DC Date:  10/01/2013     Status Recommendation to Physician:   Result of Recommendation:    Other ED Services  Consult Working Plan    DC Planning Services  PCP issues  Other  Outpatient Services - Pt will follow up    Choice offered to / List presented to:            Status of service:  Completed, signed off  ED Comments:   ED Comments Detail:

## 2013-10-01 NOTE — ED Provider Notes (Signed)
Medical screening examination/treatment/procedure(s) were performed by non-physician practitioner and as supervising physician I was immediately available for consultation/collaboration.  Khoa Opdahl E Nekeisha Aure, MD 10/01/13 1556 

## 2013-10-01 NOTE — Discharge Instructions (Signed)
Take Clindamycin as directed until gone. Return to the ED with worsening or concerning symptoms. Refer to attached documents for more information.

## 2014-01-15 NOTE — Progress Notes (Signed)
This encounter was created in error - please disregard.

## 2014-02-11 ENCOUNTER — Emergency Department (HOSPITAL_COMMUNITY)
Admission: EM | Admit: 2014-02-11 | Discharge: 2014-02-11 | Disposition: A | Discharge status: Home / Self Care | Payer: Medicare Other | Attending: Emergency Medicine | Admitting: Emergency Medicine

## 2014-02-11 ENCOUNTER — Emergency Department (HOSPITAL_COMMUNITY): Payer: Medicare Other

## 2014-02-11 ENCOUNTER — Encounter (HOSPITAL_COMMUNITY): Payer: Self-pay | Admitting: Emergency Medicine

## 2014-02-11 DIAGNOSIS — F17200 Nicotine dependence, unspecified, uncomplicated: Secondary | ICD-10-CM | POA: Diagnosis not present

## 2014-02-11 DIAGNOSIS — I10 Essential (primary) hypertension: Secondary | ICD-10-CM | POA: Insufficient documentation

## 2014-02-11 DIAGNOSIS — M5442 Lumbago with sciatica, left side: Secondary | ICD-10-CM

## 2014-02-11 DIAGNOSIS — J449 Chronic obstructive pulmonary disease, unspecified: Secondary | ICD-10-CM | POA: Diagnosis not present

## 2014-02-11 DIAGNOSIS — IMO0002 Reserved for concepts with insufficient information to code with codable children: Secondary | ICD-10-CM | POA: Diagnosis not present

## 2014-02-11 DIAGNOSIS — M5430 Sciatica, unspecified side: Secondary | ICD-10-CM | POA: Diagnosis not present

## 2014-02-11 DIAGNOSIS — Z9889 Other specified postprocedural states: Secondary | ICD-10-CM | POA: Insufficient documentation

## 2014-02-11 DIAGNOSIS — G40909 Epilepsy, unspecified, not intractable, without status epilepticus: Secondary | ICD-10-CM | POA: Insufficient documentation

## 2014-02-11 DIAGNOSIS — G8929 Other chronic pain: Secondary | ICD-10-CM | POA: Insufficient documentation

## 2014-02-11 DIAGNOSIS — Z880 Allergy status to penicillin: Secondary | ICD-10-CM | POA: Insufficient documentation

## 2014-02-11 DIAGNOSIS — F411 Generalized anxiety disorder: Secondary | ICD-10-CM | POA: Diagnosis not present

## 2014-02-11 DIAGNOSIS — J4489 Other specified chronic obstructive pulmonary disease: Secondary | ICD-10-CM | POA: Insufficient documentation

## 2014-02-11 DIAGNOSIS — M545 Low back pain, unspecified: Secondary | ICD-10-CM | POA: Diagnosis present

## 2014-02-11 MED ORDER — TIZANIDINE HCL 2 MG PO TABS: 2.0000 mg | ORAL_TABLET | Freq: Four times a day (QID) | ORAL | Status: DC | PRN

## 2014-02-11 MED ORDER — DIAZEPAM 5 MG PO TABS
5.0000 mg | ORAL_TABLET | Freq: Once | ORAL | Status: AC
  Administered 2014-02-11: 5 mg via ORAL
  Filled 2014-02-11: qty 1

## 2014-02-11 MED ORDER — OXYCODONE-ACETAMINOPHEN 5-325 MG PO TABS: 1.0000 | ORAL_TABLET | Freq: Four times a day (QID) | ORAL | Status: DC | PRN

## 2014-02-11 MED ORDER — PREDNISONE 10 MG PO TABS: 20.0000 mg | ORAL_TABLET | Freq: Every day | ORAL | Status: DC

## 2014-02-11 MED ORDER — HYDROMORPHONE HCL PF 1 MG/ML IJ SOLN
1.0000 mg | Freq: Once | INTRAMUSCULAR | Status: AC
  Administered 2014-02-11: 1 mg via INTRAMUSCULAR
  Filled 2014-02-11: qty 1

## 2014-02-11 NOTE — Discharge Instructions (Signed)
Return to the ED with any concerns including not able to urinate, weakness of legs, loss of control of bowel or bladder, fever/chills, decreased level of alertness/lethargy, or any other alarming symptoms

## 2014-02-11 NOTE — ED Provider Notes (Signed)
CSN: [id number]     Arrival date & time 02/11/14  0121 History   First MD Initiated Contact with Patient 02/11/14 [phone]     Chief Complaint  Patient presents with  . Back Pain     (Consider location/radiation/quality/duration/timing/severity/associated sxs/prior Treatment) HPI Pt presents with increase in his chronic lower back pain.  He states he was walking down stairs 3 days ago and missed a step, jarring his back. He did not fall or have any direct trauma to his back but felt a pop and since that time has been having an increase in lower back pain.  Pain is in the midline and to the left side, with radiation down his left leg.  No weakness of leg, no urinary retention, no incontinence of bowel or bladder.  No fever.  States he had been under a pain contract in the past, but has changed doctors and is no longer under a pain medication contract.  He states he has an appointment to see her next week, but at this time has been trying tylenol which is not helping his pain.  Pain radiates down left leg past the knee.  No urinary symptoms. There are no other associated systemic symptoms, there are no other alleviating or modifying factors.   Past Medical History  Diagnosis Date  . Anxiety   . Chronic back pain   . Seizures   . COPD (chronic obstructive pulmonary disease)   . Hypertension    Past Surgical History  Procedure Laterality Date  . Back surgery    . Appendectomy    . Ankle surgery     Family History  Problem Relation Age of Onset  . CAD Other   . Cancer Other   . Diabetes Other    History  Substance Use Topics  . Smoking status: Current Every Day Smoker    Types: Cigarettes  . Smokeless tobacco: Not on file  . Alcohol Use: No    Review of Systems ROS reviewed and all otherwise negative except for mentioned in HPI    Allergies  Penicillins; Ibuprofen; and Erythromycin  Home Medications   Prior to Admission medications   Medication Sig Start Date End Date  Taking? Authorizing Provider  acetaminophen (TYLENOL) 500 MG tablet Take 500 mg by mouth every 6 (six) hours as needed for moderate pain.   Yes Historical Provider, MD  albuterol (PROVENTIL HFA;VENTOLIN HFA) 108 (90 BASE) MCG/ACT inhaler Inhale 2 puffs into the lungs every 6 (six) hours as needed for wheezing or shortness of breath.   Yes Historical Provider, MD  amphetamine-dextroamphetamine (ADDERALL) 20 MG tablet Take 20 mg by mouth 2 (two) times daily.   Yes Historical Provider, MD  atenolol-chlorthalidone (TENORETIC) 50-25 MG per tablet Take 1 tablet by mouth daily.    Yes Historical Provider, MD  buPROPion (WELLBUTRIN XL) 300 MG 24 hr tablet Take 300 mg by mouth daily.   Yes Historical Provider, MD  clonazePAM (KLONOPIN) 1 MG tablet Take 1 mg by mouth 4 (four) times daily as needed for anxiety.    Yes Historical Provider, MD  fluticasone (FLONASE) 50 MCG/ACT nasal spray Place 1-2 sprays into both nostrils daily.   Yes Historical Provider, MD  lisinopril (PRINIVIL,ZESTRIL) 5 MG tablet Take 5 mg by mouth daily.   Yes Historical Provider, MD  loratadine (CLARITIN) 10 MG tablet Take 10 mg by mouth daily.   Yes Historical Provider, MD  omeprazole (PRILOSEC) 40 MG capsule Take 40 mg by mouth 2 (two) times daily.  Yes Historical Provider, MD  oxyCODONE-acetaminophen (PERCOCET) 10-325 MG per tablet Take 1 tablet by mouth every 8 (eight) hours as needed for pain.   Yes Historical Provider, MD  sertraline (ZOLOFT) 100 MG tablet Take 100 mg by mouth daily.    Yes Historical Provider, MD  tiZANidine (ZANAFLEX) 4 MG tablet Take 1 tablet by mouth 2 (two) times daily as needed. Muscle spasms 02/05/14  Yes Historical Provider, MD  oxyCODONE-acetaminophen (PERCOCET/ROXICET) 5-325 MG per tablet Take 1-2 tablets by mouth every 6 (six) hours as needed for severe pain. 02/11/14   Ethelda ChickMartha K Linker, MD  predniSONE (DELTASONE) 10 MG tablet Take 2 tablets (20 mg total) by mouth daily. Take 4 tablets po qD x 3 days, then  3 tabs po qD x 3 days, then 2 tabs po qD x 3 days, then 1 tab po qD x 3 days 02/11/14   Ethelda ChickMartha K Linker, MD  tiZANidine (ZANAFLEX) 2 MG tablet Take 1 tablet (2 mg total) by mouth every 6 (six) hours as needed for muscle spasms. 02/11/14   Ethelda ChickMartha K Linker, MD   BP 132/81  Pulse 65  Temp(Src) 97.9 F (36.6 C) (Oral)  Resp 18  Ht 5\' 11"  (1.803 m)  Wt 165 lb (74.844 kg)  BMI 23.02 kg/m2  SpO2 100% Vitals reviewed Physical Exam Physical Examination: General appearance - alert, well appearing, and in no distress Mental status - alert, oriented to person, place, and time Eyes - no conjunctival injection, no scleral icterus Mouth - mucous membranes moist, pharynx normal without lesions Chest - clear to auscultation, no wheezes, rales or rhonchi, symmetric air entry Heart - normal rate, regular rhythm, normal S1, S2, no murmurs, rubs, clicks or gallops Abdomen - soft, nontender, nondistended, no masses or organomegaly Back exam - mild ttp in lumbar region, left paraspinal lumbar tenderness, no CVA tenderness Neurology- strength 5/5 in lower extremities  X 2, sensation intact distally, normal gait Extremities - peripheral pulses normal, no pedal edema, no clubbing or cyanosis Skin - normal coloration and turgor, no rashes  ED Course  Procedures (including critical care time) Labs Review Labs Reviewed - No data to display  Imaging Review No results found.   EKG Interpretation None      MDM   Final diagnoses:  Left-sided low back pain with left-sided sciatica    Pt presents with increased in his chronic low back pain, pain radiating down left leg. C/w sciatica. No signs or symptoms of cauda equina.  Pt treated with pain meds, muscle relaxer.  Discharged with strict return precautions.  Pt agreeable with plan.    Ethelda ChickMartha K Linker, MD 02/16/14 [phone]

## 2014-02-11 NOTE — ED Notes (Signed)
Pt states he was going down the porch steps and missed one causing him to jar to the left side  Pt states since he has been having lower back pain with pain radiating down his left leg  Pt states he called the dr and got an appt but he states the pain is getting worse and he cannot wait to see her

## 2014-02-23 ENCOUNTER — Emergency Department (HOSPITAL_COMMUNITY): Payer: Medicare Other

## 2014-02-23 ENCOUNTER — Emergency Department (HOSPITAL_COMMUNITY)
Admission: EM | Admit: 2014-02-23 | Discharge: 2014-02-23 | Disposition: A | Discharge status: Home / Self Care | Payer: Medicare Other | Attending: Emergency Medicine | Admitting: Emergency Medicine

## 2014-02-23 ENCOUNTER — Encounter (HOSPITAL_COMMUNITY): Payer: Self-pay | Admitting: Emergency Medicine

## 2014-02-23 DIAGNOSIS — Z880 Allergy status to penicillin: Secondary | ICD-10-CM | POA: Insufficient documentation

## 2014-02-23 DIAGNOSIS — IMO0002 Reserved for concepts with insufficient information to code with codable children: Secondary | ICD-10-CM | POA: Diagnosis not present

## 2014-02-23 DIAGNOSIS — Y9389 Activity, other specified: Secondary | ICD-10-CM | POA: Diagnosis not present

## 2014-02-23 DIAGNOSIS — S6990XA Unspecified injury of unspecified wrist, hand and finger(s), initial encounter: Principal | ICD-10-CM | POA: Insufficient documentation

## 2014-02-23 DIAGNOSIS — J449 Chronic obstructive pulmonary disease, unspecified: Secondary | ICD-10-CM | POA: Insufficient documentation

## 2014-02-23 DIAGNOSIS — W230XXA Caught, crushed, jammed, or pinched between moving objects, initial encounter: Secondary | ICD-10-CM | POA: Diagnosis not present

## 2014-02-23 DIAGNOSIS — F17200 Nicotine dependence, unspecified, uncomplicated: Secondary | ICD-10-CM | POA: Diagnosis not present

## 2014-02-23 DIAGNOSIS — S6980XA Other specified injuries of unspecified wrist, hand and finger(s), initial encounter: Secondary | ICD-10-CM | POA: Diagnosis present

## 2014-02-23 DIAGNOSIS — S6991XA Unspecified injury of right wrist, hand and finger(s), initial encounter: Secondary | ICD-10-CM

## 2014-02-23 DIAGNOSIS — F411 Generalized anxiety disorder: Secondary | ICD-10-CM | POA: Insufficient documentation

## 2014-02-23 DIAGNOSIS — G8929 Other chronic pain: Secondary | ICD-10-CM | POA: Diagnosis not present

## 2014-02-23 DIAGNOSIS — I10 Essential (primary) hypertension: Secondary | ICD-10-CM | POA: Insufficient documentation

## 2014-02-23 DIAGNOSIS — Z79899 Other long term (current) drug therapy: Secondary | ICD-10-CM | POA: Insufficient documentation

## 2014-02-23 DIAGNOSIS — Y990 Civilian activity done for income or pay: Secondary | ICD-10-CM | POA: Insufficient documentation

## 2014-02-23 DIAGNOSIS — Y9289 Other specified places as the place of occurrence of the external cause: Secondary | ICD-10-CM | POA: Diagnosis not present

## 2014-02-23 DIAGNOSIS — J4489 Other specified chronic obstructive pulmonary disease: Secondary | ICD-10-CM | POA: Insufficient documentation

## 2014-02-23 DIAGNOSIS — G40909 Epilepsy, unspecified, not intractable, without status epilepticus: Secondary | ICD-10-CM | POA: Diagnosis not present

## 2014-02-23 MED ORDER — MORPHINE SULFATE 4 MG/ML IJ SOLN
4.0000 mg | Freq: Once | INTRAMUSCULAR | Status: AC
  Administered 2014-02-23: 4 mg via INTRAMUSCULAR
  Filled 2014-02-23: qty 1

## 2014-02-23 MED ORDER — HYDROCODONE-ACETAMINOPHEN 5-325 MG PO TABS: 1.0000 | ORAL_TABLET | ORAL | Status: DC | PRN

## 2014-02-23 NOTE — Discharge Instructions (Signed)
Finger Sprain °A finger sprain is a tear in one of the strong, fibrous tissues that connect the bones (ligaments) in your finger. The severity of the sprain depends on how much of the ligament is torn. The tear can be either partial or complete. °CAUSES  °Often, sprains are a result of a fall or accident. If you extend your hands to catch an object or to protect yourself, the force of the impact causes the fibers of your ligament to stretch too much. This excess tension causes the fibers of your ligament to tear. °SYMPTOMS  °You may have some loss of motion in your finger. Other symptoms include: °· Bruising. °· Tenderness. °· Swelling. °DIAGNOSIS  °In order to diagnose finger sprain, your caregiver will physically examine your finger or thumb to determine how torn the ligament is. Your caregiver may also suggest an X-ray exam of your finger to make sure no bones are broken. °TREATMENT  °If your ligament is only partially torn, treatment usually involves keeping the finger in a fixed position (immobilization) for a short period. To do this, your caregiver will apply a bandage, cast, or splint to keep your finger from moving until it heals. For a partially torn ligament, the healing process usually takes 2 to 3 weeks. °If your ligament is completely torn, you may need surgery to reconnect the ligament to the bone. After surgery a cast or splint will be applied and will need to stay on your finger or thumb for 4 to 6 weeks while your ligament heals. °HOME CARE INSTRUCTIONS °· Keep your injured finger elevated, when possible, to decrease swelling. °· To ease pain and swelling, apply ice to your joint twice a day, for 2 to 3 days: °¨ Put ice in a plastic bag. °¨ Place a towel between your skin and the bag. °¨ Leave the ice on for 15 minutes. °· Only take over-the-counter or prescription medicine for pain as directed by your caregiver. °· Do not wear rings on your injured finger. °· Do not leave your finger unprotected  until pain and stiffness go away (usually 3 to 4 weeks). °· Do not allow your cast or splint to get wet. Cover your cast or splint with a plastic bag when you shower or bathe. Do not swim. °· Your caregiver may suggest special exercises for you to do during your recovery to prevent or limit permanent stiffness. °SEEK IMMEDIATE MEDICAL CARE IF: °· Your cast or splint becomes damaged. °· Your pain becomes worse rather than better. °MAKE SURE YOU: °· Understand these instructions. °· Will watch your condition. °· Will get help right away if you are not doing well or get worse. °Document Released: 09/13/2004 Document Revised: 10/29/2011 Document Reviewed: 04/09/2011 °[employer]® Patient Information ©2015 [employer]. This information is not intended to replace advice given to you by your health care provider. Make sure you discuss any questions you have with your health care provider. ° ° °RICE: Routine Care for Injuries °The routine care of many injuries includes Rest, Ice, Compression, and Elevation (RICE). °HOME CARE INSTRUCTIONS °· Rest is needed to allow your body to heal. Routine activities can usually be resumed when comfortable. Injured tendons and bones can take up to 6 weeks to heal. Tendons are the cord-like structures that attach muscle to bone. °· Ice following an injury helps keep the swelling down and reduces pain. °¨ Put ice in a plastic bag. °¨ Place a towel between your skin and the bag. °¨ Leave the ice on   for 15-20 minutes, 3-4 times a day, or as directed by your health care provider. Do this while awake, for the first 24 to 48 hours. After that, continue as directed by your caregiver. °· Compression helps keep swelling down. It also gives support and helps with discomfort. If an elastic bandage has been applied, it should be removed and reapplied every 3 to 4 hours. It should not be applied tightly, but firmly enough to keep swelling down. Watch fingers or toes for swelling, bluish discoloration,  coldness, numbness, or excessive pain. If any of these problems occur, remove the bandage and reapply loosely. Contact your caregiver if these problems continue. °· Elevation helps reduce swelling and decreases pain. With extremities, such as the arms, hands, legs, and feet, the injured area should be placed near or above the level of the heart, if possible. °SEEK IMMEDIATE MEDICAL CARE IF: °· You have persistent pain and swelling. °· You develop redness, numbness, or unexpected weakness. °· Your symptoms are getting worse rather than improving after several days. °These symptoms may indicate that further evaluation or further X-rays are needed. Sometimes, X-rays may not show a small broken bone (fracture) until 1 week or 10 days later. Make a follow-up appointment with your caregiver. Ask when your X-ray results will be ready. Make sure you get your X-ray results. °Document Released: 11/18/2000 Document Revised: 08/11/2013 Document Reviewed: 01/05/2011 °[employer]® Patient Information ©2015 [employer]. This information is not intended to replace advice given to you by your health care provider. Make sure you discuss any questions you have with your health care provider. ° ° °
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
## 2014-02-23 NOTE — ED Notes (Signed)
Patient transported to X-ray 
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
## 2014-03-14 ENCOUNTER — Encounter (HOSPITAL_COMMUNITY): Payer: Self-pay | Admitting: Emergency Medicine

## 2014-03-14 ENCOUNTER — Emergency Department (HOSPITAL_COMMUNITY)
Admission: EM | Admit: 2014-03-14 | Discharge: 2014-03-14 | Disposition: A | Discharge status: Home / Self Care | Payer: Medicare Other | Attending: Emergency Medicine | Admitting: Emergency Medicine

## 2014-03-14 DIAGNOSIS — M5416 Radiculopathy, lumbar region: Secondary | ICD-10-CM

## 2014-03-14 DIAGNOSIS — Z79899 Other long term (current) drug therapy: Secondary | ICD-10-CM | POA: Insufficient documentation

## 2014-03-14 DIAGNOSIS — J449 Chronic obstructive pulmonary disease, unspecified: Secondary | ICD-10-CM | POA: Diagnosis not present

## 2014-03-14 DIAGNOSIS — Z880 Allergy status to penicillin: Secondary | ICD-10-CM | POA: Diagnosis not present

## 2014-03-14 DIAGNOSIS — M549 Dorsalgia, unspecified: Secondary | ICD-10-CM | POA: Diagnosis present

## 2014-03-14 DIAGNOSIS — I10 Essential (primary) hypertension: Secondary | ICD-10-CM | POA: Diagnosis not present

## 2014-03-14 DIAGNOSIS — Z9889 Other specified postprocedural states: Secondary | ICD-10-CM | POA: Diagnosis not present

## 2014-03-14 DIAGNOSIS — IMO0002 Reserved for concepts with insufficient information to code with codable children: Secondary | ICD-10-CM | POA: Insufficient documentation

## 2014-03-14 DIAGNOSIS — G8929 Other chronic pain: Secondary | ICD-10-CM | POA: Insufficient documentation

## 2014-03-14 DIAGNOSIS — F17200 Nicotine dependence, unspecified, uncomplicated: Secondary | ICD-10-CM | POA: Diagnosis not present

## 2014-03-14 DIAGNOSIS — G40909 Epilepsy, unspecified, not intractable, without status epilepticus: Secondary | ICD-10-CM | POA: Diagnosis not present

## 2014-03-14 DIAGNOSIS — F411 Generalized anxiety disorder: Secondary | ICD-10-CM | POA: Insufficient documentation

## 2014-03-14 DIAGNOSIS — J4489 Other specified chronic obstructive pulmonary disease: Secondary | ICD-10-CM | POA: Insufficient documentation

## 2014-03-14 MED ORDER — METHOCARBAMOL 500 MG PO TABS
1000.0000 mg | ORAL_TABLET | Freq: Once | ORAL | Status: AC
  Administered 2014-03-14: 1000 mg via ORAL
  Filled 2014-03-14: qty 2

## 2014-03-14 MED ORDER — METHOCARBAMOL 500 MG PO TABS: 1000.0000 mg | ORAL_TABLET | Freq: Four times a day (QID) | ORAL | Status: DC | PRN

## 2014-03-14 MED ORDER — PREDNISONE 20 MG PO TABS: 40.0000 mg | ORAL_TABLET | Freq: Every day | ORAL | Status: DC

## 2014-03-14 MED ORDER — HYDROCODONE-ACETAMINOPHEN 5-325 MG PO TABS: ORAL_TABLET | ORAL | Status: DC

## 2014-03-14 MED ORDER — PREDNISONE 20 MG PO TABS
60.0000 mg | ORAL_TABLET | Freq: Once | ORAL | Status: AC
  Administered 2014-03-14: 60 mg via ORAL
  Filled 2014-03-14: qty 3

## 2014-03-14 MED ORDER — HYDROCODONE-ACETAMINOPHEN 5-325 MG PO TABS
1.0000 | ORAL_TABLET | Freq: Once | ORAL | Status: AC
  Administered 2014-03-14: 1 via ORAL
  Filled 2014-03-14: qty 1

## 2014-03-14 NOTE — ED Notes (Signed)
Pt reports chronic back pain, hx 2 back surgeries. Reports back pain flared up last few days, pain 10/10. Radiates down left leg. Pt has appointment with doctor on 8/4, but was told to come to ED if pain got worse before then. Ambulatory.

## 2014-03-14 NOTE — Discharge Instructions (Signed)
Take Robaxin and/or Vicodin for breakthrough pain, do not drink alcohol, drive, care for children or perform other critical possible taking Robaxin and/or Vicodin.  Please follow with your primary care doctor in the next 2 days for a check-up. They must obtain records for further management.   Do not hesitate to return to the Emergency Department for any new, worsening or concerning symptoms.   Please follow with your primary care doctor in the next 2 days for a check-up. They must obtain records for further management.   Do not hesitate to return to the Emergency Department for any new, worsening or concerning symptoms.

## 2014-03-14 NOTE — ED Provider Notes (Signed)
Medical screening examination/treatment/procedure(s) were performed by non-physician practitioner and as supervising physician I was immediately available for consultation/collaboration.   EKG Interpretation None      Devoria AlbeIva Brecken Walth, MD, Armando GangFACEP   Ward GivensIva L Moriya Mitchell, MD 03/14/14 [phone]

## 2014-03-14 NOTE — ED Provider Notes (Signed)
CSN: [id number]     Arrival date & time 03/14/14  1232 History   First MD Initiated Contact with Patient 03/14/14 1336    This chart was scribed for non-physician practitioner Wynetta Emery, PA-C working with Ward Givens, MD, by Andrew Au, ED Scribe. This patient was seen in room WTR8/WTR8 and the patient's care was started at 1:42 PM. Chief Complaint  Patient presents with  . Back Pain    Patient is a 43 y.o. male presenting with back pain. The history is provided by the patient. No language interpreter was used.  Back Pain Associated symptoms: no fever    Eddie Williams is a 43 y.o. male with hx of chronic back pain who presents to the Emergency Department complaining of radiating constant lower back pain x 4 days ago. Pt reports pain radiates done left leg that stops at knee. Pt called PCP who referred him to a pain clinic but is unable to get an appointment until November. Pt has been taking tylenol extra strength. Pt denies fever and chill. Pt denies bladder and bowel incontinence.  Pt denies IV drug abuse. Pt  Has h/o of 2 back surgeries done by Dr. Dutch Quint.   Past Medical History  Diagnosis Date  . Anxiety   . Chronic back pain   . Seizures   . COPD (chronic obstructive pulmonary disease)   . Hypertension    Past Surgical History  Procedure Laterality Date  . Back surgery    . Appendectomy    . Ankle surgery     Family History  Problem Relation Age of Onset  . CAD Other   . Cancer Other   . Diabetes Other    History  Substance Use Topics  . Smoking status: Current Every Day Smoker    Types: Cigarettes  . Smokeless tobacco: Not on file  . Alcohol Use: Yes     Comment: social     Review of Systems  Constitutional: Negative for fever and chills.  Gastrointestinal: Negative for diarrhea, constipation and blood in stool.  Genitourinary: Negative for urgency, frequency, decreased urine volume and difficulty urinating.  Musculoskeletal: Positive for back pain and  myalgias.  All other systems reviewed and are negative.  Allergies  Penicillins; Ibuprofen; and Erythromycin  Home Medications   Prior to Admission medications   Medication Sig Start Date End Date Taking? Authorizing Provider  acetaminophen (TYLENOL) 500 MG tablet Take 500 mg by mouth every 6 (six) hours as needed for moderate pain.   Yes Historical Provider, MD  albuterol (PROVENTIL HFA;VENTOLIN HFA) 108 (90 BASE) MCG/ACT inhaler Inhale 2 puffs into the lungs every 6 (six) hours as needed for wheezing or shortness of breath.   Yes Historical Provider, MD  amphetamine-dextroamphetamine (ADDERALL) 20 MG tablet Take 20 mg by mouth 2 (two) times daily.   Yes Historical Provider, MD  atenolol-chlorthalidone (TENORETIC) 50-25 MG per tablet Take 1 tablet by mouth daily.    Yes Historical Provider, MD  buPROPion (WELLBUTRIN XL) 300 MG 24 hr tablet Take 300 mg by mouth every morning.    Yes Historical Provider, MD  clonazePAM (KLONOPIN) 1 MG tablet Take 1 mg by mouth 4 (four) times daily.    Yes Historical Provider, MD  fluticasone (FLONASE) 50 MCG/ACT nasal spray Place 1-2 sprays into both nostrils daily as needed for allergies or rhinitis.    Yes Historical Provider, MD  lisinopril (PRINIVIL,ZESTRIL) 5 MG tablet Take 5 mg by mouth at bedtime.    Yes Historical  Provider, MD  loratadine (CLARITIN) 10 MG tablet Take 10 mg by mouth every morning.    Yes Historical Provider, MD  omeprazole (PRILOSEC) 40 MG capsule Take 40 mg by mouth 2 (two) times daily.    Yes Historical Provider, MD  oxyCODONE-acetaminophen (PERCOCET) 10-325 MG per tablet Take 1 tablet by mouth every 8 (eight) hours as needed for pain.   Yes Historical Provider, MD  sertraline (ZOLOFT) 100 MG tablet Take 100 mg by mouth every morning.    Yes Historical Provider, MD  tiZANidine (ZANAFLEX) 4 MG tablet Take 1 tablet by mouth 2 (two) times daily as needed for muscle spasms.  02/05/14  Yes Historical Provider, MD  HYDROcodone-acetaminophen  (NORCO/VICODIN) 5-325 MG per tablet Take 1-2 tablets by mouth every 6 hours as needed for pain. 03/14/14   Chevis Weisensel, PA-C  methocarbamol (ROBAXIN) 500 MG tablet Take 2 tablets (1,000 mg total) by mouth 4 (four) times daily as needed (Pain). 03/14/14   Islay Polanco, PA-C  predniSONE (DELTASONE) 20 MG tablet Take 2 tablets (40 mg total) by mouth daily. 03/14/14   Mikel Pyon, PA-C   BP 131/83  Pulse 81  Temp(Src) 98.3 F (36.8 C) (Oral)  Resp 16  SpO2 100% Physical Exam  Nursing note and vitals reviewed. Constitutional: He is oriented to person, place, and time. He appears well-developed and well-nourished. No distress.  HENT:  Head: Normocephalic and atraumatic.  Eyes: Conjunctivae and EOM are normal.  Neck: Neck supple.  Cardiovascular: Normal rate.   Pulmonary/Chest: Effort normal.  Musculoskeletal: Normal range of motion.  No point tenderness to percussion of lumbar spinal processes.  No TTP or paraspinal muscular spasm. Strength is 5 out of 5 to bilateral lower extremities at hip and knee; extensor hallucis longus 5 out of 5. Ankle strength 5 out of 5, no clonus, neurovascularly intact. No saddle anaesthesia. Patellar reflexes are 2+ bilaterally.       Neurological: He is alert and oriented to person, place, and time.  Skin: Skin is warm and dry.  Psychiatric: He has a normal mood and affect. His behavior is normal.    ED Course  Procedures (including critical care time) Labs Review Labs Reviewed - No data to display  Imaging Review No results found.   EKG Interpretation None      MDM   Final diagnoses:  Radiculopathy, lumbar region   Filed Vitals:   03/14/14 1239  BP: 131/83  Pulse: 81  Temp: 98.3 F (36.8 C)  TempSrc: Oral  Resp: 16  SpO2: 100%    Medications  HYDROcodone-acetaminophen (NORCO/VICODIN) 5-325 MG per tablet 1 tablet (not administered)  methocarbamol (ROBAXIN) tablet 1,000 mg (not administered)  predniSONE (DELTASONE) tablet  60 mg (not administered)    Eddie Williams is a 43 y.o. male presenting with  back pain.  No neurological deficits and normal neuro exam.  Patient can walk but states is painful.  No loss of bowel or bladder control.  No concern for cauda equina.  No fever, night sweats, weight loss, h/o cancer, IVDU.  RICE protocol and pain medicine indicated and discussed with patient.  Evaluation does not show pathology that would require ongoing emergent intervention or inpatient treatment. Pt is hemodynamically stable and mentating appropriately. Discussed findings and plan with patient/guardian, who agrees with care plan. All questions answered. Return precautions discussed and outpatient follow up given.   New Prescriptions   HYDROCODONE-ACETAMINOPHEN (NORCO/VICODIN) 5-325 MG PER TABLET    Take 1-2 tablets by mouth every 6  hours as needed for pain.   METHOCARBAMOL (ROBAXIN) 500 MG TABLET    Take 2 tablets (1,000 mg total) by mouth 4 (four) times daily as needed (Pain).   PREDNISONE (DELTASONE) 20 MG TABLET    Take 2 tablets (40 mg total) by mouth daily.      I personally performed the services described in this documentation, which was scribed in my presence. The recorded information has been reviewed and is accurate.      Wynetta Emery, PA-C 03/14/14 1401

## 2014-04-08 ENCOUNTER — Encounter (HOSPITAL_COMMUNITY): Payer: Self-pay | Admitting: Emergency Medicine

## 2014-04-08 ENCOUNTER — Emergency Department (HOSPITAL_COMMUNITY)
Admission: EM | Admit: 2014-04-08 | Discharge: 2014-04-08 | Disposition: A | Discharge status: Home / Self Care | Payer: Medicare Other | Attending: Emergency Medicine | Admitting: Emergency Medicine

## 2014-04-08 ENCOUNTER — Emergency Department (HOSPITAL_COMMUNITY): Payer: Medicare Other

## 2014-04-08 DIAGNOSIS — G8929 Other chronic pain: Secondary | ICD-10-CM | POA: Diagnosis not present

## 2014-04-08 DIAGNOSIS — F411 Generalized anxiety disorder: Secondary | ICD-10-CM | POA: Diagnosis not present

## 2014-04-08 DIAGNOSIS — Z79899 Other long term (current) drug therapy: Secondary | ICD-10-CM | POA: Insufficient documentation

## 2014-04-08 DIAGNOSIS — Z880 Allergy status to penicillin: Secondary | ICD-10-CM | POA: Diagnosis not present

## 2014-04-08 DIAGNOSIS — R569 Unspecified convulsions: Secondary | ICD-10-CM | POA: Insufficient documentation

## 2014-04-08 DIAGNOSIS — J449 Chronic obstructive pulmonary disease, unspecified: Secondary | ICD-10-CM | POA: Insufficient documentation

## 2014-04-08 DIAGNOSIS — I10 Essential (primary) hypertension: Secondary | ICD-10-CM | POA: Insufficient documentation

## 2014-04-08 DIAGNOSIS — J4489 Other specified chronic obstructive pulmonary disease: Secondary | ICD-10-CM | POA: Insufficient documentation

## 2014-04-08 DIAGNOSIS — IMO0002 Reserved for concepts with insufficient information to code with codable children: Secondary | ICD-10-CM | POA: Insufficient documentation

## 2014-04-08 DIAGNOSIS — F17200 Nicotine dependence, unspecified, uncomplicated: Secondary | ICD-10-CM | POA: Insufficient documentation

## 2014-04-08 DIAGNOSIS — M549 Dorsalgia, unspecified: Secondary | ICD-10-CM

## 2014-04-08 MED ORDER — METHOCARBAMOL 500 MG PO TABS: 500.0000 mg | ORAL_TABLET | Freq: Two times a day (BID) | ORAL | Status: DC

## 2014-04-08 MED ORDER — KETOROLAC TROMETHAMINE 60 MG/2ML IM SOLN
60.0000 mg | Freq: Once | INTRAMUSCULAR | Status: AC
  Administered 2014-04-08: 60 mg via INTRAMUSCULAR
  Filled 2014-04-08: qty 2

## 2014-04-08 NOTE — ED Provider Notes (Signed)
Medical screening examination/treatment/procedure(s) were performed by non-physician practitioner and as supervising physician I was immediately available for consultation/collaboration.   Toy BakerAnthony T Oletha Tolson, MD 04/08/14 [phone]
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
## 2014-04-08 NOTE — Discharge Instructions (Signed)
Take the prescribed medication as directed.  May take tylenol with this if needed. Follow-up with your primary care physician. Return to the ED for new or worsening symptoms.
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
## 2014-04-10 ENCOUNTER — Emergency Department (HOSPITAL_COMMUNITY)
Admission: EM | Admit: 2014-04-10 | Discharge: 2014-04-11 | Disposition: A | Discharge status: Home / Self Care | Payer: Medicare Other | Attending: Emergency Medicine | Admitting: Emergency Medicine

## 2014-04-10 ENCOUNTER — Encounter (HOSPITAL_COMMUNITY): Payer: Self-pay | Admitting: Emergency Medicine

## 2014-04-10 DIAGNOSIS — M549 Dorsalgia, unspecified: Secondary | ICD-10-CM | POA: Diagnosis present

## 2014-04-10 DIAGNOSIS — Z9889 Other specified postprocedural states: Secondary | ICD-10-CM | POA: Diagnosis not present

## 2014-04-10 DIAGNOSIS — F17200 Nicotine dependence, unspecified, uncomplicated: Secondary | ICD-10-CM | POA: Diagnosis not present

## 2014-04-10 DIAGNOSIS — F411 Generalized anxiety disorder: Secondary | ICD-10-CM | POA: Diagnosis not present

## 2014-04-10 DIAGNOSIS — G8929 Other chronic pain: Secondary | ICD-10-CM | POA: Insufficient documentation

## 2014-04-10 DIAGNOSIS — I10 Essential (primary) hypertension: Secondary | ICD-10-CM | POA: Insufficient documentation

## 2014-04-10 DIAGNOSIS — Z880 Allergy status to penicillin: Secondary | ICD-10-CM | POA: Diagnosis not present

## 2014-04-10 DIAGNOSIS — J449 Chronic obstructive pulmonary disease, unspecified: Secondary | ICD-10-CM | POA: Insufficient documentation

## 2014-04-10 DIAGNOSIS — Z79899 Other long term (current) drug therapy: Secondary | ICD-10-CM | POA: Diagnosis not present

## 2014-04-10 DIAGNOSIS — IMO0002 Reserved for concepts with insufficient information to code with codable children: Secondary | ICD-10-CM | POA: Diagnosis not present

## 2014-04-10 DIAGNOSIS — J4489 Other specified chronic obstructive pulmonary disease: Secondary | ICD-10-CM | POA: Insufficient documentation
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
## 2014-04-11 DIAGNOSIS — M549 Dorsalgia, unspecified: Secondary | ICD-10-CM | POA: Diagnosis not present

## 2014-04-11 MED ORDER — HYDROCODONE-ACETAMINOPHEN 5-325 MG PO TABS
1.0000 | ORAL_TABLET | Freq: Once | ORAL | Status: AC
  Administered 2014-04-11: 1 via ORAL
  Filled 2014-04-11: qty 1

## 2014-04-11 NOTE — ED Notes (Signed)
Patient is having back pain from his neck to his tail bone. He also states that it is like a burning sensation in his legs.
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
## 2014-04-11 NOTE — Discharge Instructions (Signed)
Chronic Pain Discharge Instructions  °Emergency care providers appreciate that many patients coming to us are in severe pain and we wish to address their pain in the safest, most responsible manner.  It is important to recognize however, that the proper treatment of chronic pain differs from that of the pain of injuries and acute illnesses.  Our goal is to provide quality, safe, personalized care and we thank you for giving us the opportunity to serve you. °The use of narcotics and related agents for chronic pain syndromes may lead to additional physical and psychological problems.  Nearly as many people die from prescription narcotics each year as die from car crashes.  Additionally, this risk is increased if such prescriptions are obtained from a variety of sources.  Therefore, only your primary care physician or a pain management specialist is able to safely treat such syndromes with narcotic medications long-term.   ° °Documentation revealing such prescriptions have been sought from multiple sources may prohibit us from providing a refill or different narcotic medication.  Your name may be checked first through the [place] Controlled Substances Reporting System.  This database is a record of controlled substance medication prescriptions that the patient has received.  This has been established by [facility] in an effort to eliminate the dangerous, and often life threatening, practice of obtaining multiple prescriptions from different medical providers.  ° °If you have a chronic pain syndrome (i.e. chronic headaches, recurrent back or neck pain, dental pain, abdominal or pelvis pain without a specific diagnosis, or neuropathic pain such as fibromyalgia) or recurrent visits for the same condition without an acute diagnosis, you may be treated with non-narcotics and other non-addictive medicines.  Allergic reactions or negative side effects that may be reported by a patient to such medications will not  typically lead to the use of a narcotic analgesic or other controlled substance as an alternative. °  °Patients managing chronic pain with a personal physician should have provisions in place for breakthrough pain.  If you are in crisis, you should call your physician.  If your physician directs you to the emergency department, please have the doctor call and speak to our attending physician concerning your care. °  °When patients come to the Emergency Department (ED) with acute medical conditions in which the Emergency Department physician feels appropriate to prescribe narcotic or sedating pain medication, the physician will prescribe these in very limited quantities.  The amount of these medications will last only until you can see your primary care physician in his/her office.  Any patient who returns to the ED seeking refills should expect only non-narcotic pain medications.  ° °In the event of an acute medical condition exists and the emergency physician feels it is necessary that the patient be given a narcotic or sedating medication -  a responsible adult driver should be present in the room prior to the medication being given by the nurse. °  °Prescriptions for narcotic or sedating medications that have been lost, stolen or expired will not be refilled in the Emergency Department.   ° °Patients who have chronic pain may receive non-narcotic prescriptions until seen by their primary care physician.  It is every patient’s personal responsibility to maintain active prescriptions with his or her primary care physician or specialist. °

## 2014-05-29 ENCOUNTER — Encounter (HOSPITAL_COMMUNITY): Payer: Self-pay | Admitting: Emergency Medicine

## 2014-05-29 ENCOUNTER — Emergency Department (HOSPITAL_COMMUNITY)
Admission: EM | Admit: 2014-05-29 | Discharge: 2014-05-29 | Disposition: A | Discharge status: Home / Self Care | Payer: Medicare Other | Attending: Emergency Medicine | Admitting: Emergency Medicine

## 2014-05-29 DIAGNOSIS — G8929 Other chronic pain: Secondary | ICD-10-CM | POA: Diagnosis not present

## 2014-05-29 DIAGNOSIS — F419 Anxiety disorder, unspecified: Secondary | ICD-10-CM | POA: Diagnosis not present

## 2014-05-29 DIAGNOSIS — Z8669 Personal history of other diseases of the nervous system and sense organs: Secondary | ICD-10-CM | POA: Diagnosis not present

## 2014-05-29 DIAGNOSIS — J069 Acute upper respiratory infection, unspecified: Secondary | ICD-10-CM

## 2014-05-29 DIAGNOSIS — R3911 Hesitancy of micturition: Secondary | ICD-10-CM

## 2014-05-29 DIAGNOSIS — Z792 Long term (current) use of antibiotics: Secondary | ICD-10-CM | POA: Diagnosis not present

## 2014-05-29 DIAGNOSIS — R339 Retention of urine, unspecified: Secondary | ICD-10-CM | POA: Diagnosis present

## 2014-05-29 DIAGNOSIS — J449 Chronic obstructive pulmonary disease, unspecified: Secondary | ICD-10-CM | POA: Insufficient documentation

## 2014-05-29 DIAGNOSIS — R509 Fever, unspecified: Secondary | ICD-10-CM

## 2014-05-29 DIAGNOSIS — Z720 Tobacco use: Secondary | ICD-10-CM | POA: Insufficient documentation

## 2014-05-29 DIAGNOSIS — Z7952 Long term (current) use of systemic steroids: Secondary | ICD-10-CM | POA: Insufficient documentation

## 2014-05-29 DIAGNOSIS — Z79899 Other long term (current) drug therapy: Secondary | ICD-10-CM | POA: Diagnosis not present

## 2014-05-29 DIAGNOSIS — I10 Essential (primary) hypertension: Secondary | ICD-10-CM | POA: Diagnosis not present

## 2014-05-29 LAB — URINALYSIS, ROUTINE W REFLEX MICROSCOPIC
Bilirubin Urine: NEGATIVE
GLUCOSE, UA: NEGATIVE mg/dL
Hgb urine dipstick: NEGATIVE
KETONES UR: NEGATIVE mg/dL
Leukocytes, UA: NEGATIVE
NITRITE: NEGATIVE
PH: 7 (ref 5.0–8.0)
PROTEIN: NEGATIVE mg/dL
Specific Gravity, Urine: 1.022 (ref 1.005–1.030)
Urobilinogen, UA: 0.2 mg/dL (ref 0.0–1.0)

## 2014-05-29 MED ORDER — ACETAMINOPHEN 325 MG PO TABS
650.0000 mg | ORAL_TABLET | Freq: Once | ORAL | Status: AC
  Administered 2014-05-29: 650 mg via ORAL
  Filled 2014-05-29: qty 2

## 2014-05-29 MED ORDER — AZITHROMYCIN 250 MG PO TABS: 250.0000 mg | ORAL_TABLET | Freq: Every day | ORAL | Status: DC

## 2014-05-29 MED ORDER — OXYCODONE-ACETAMINOPHEN 5-325 MG PO TABS
1.0000 | ORAL_TABLET | Freq: Once | ORAL | Status: AC
  Administered 2014-05-29: 1 via ORAL
  Filled 2014-05-29: qty 1

## 2014-05-29 NOTE — ED Notes (Signed)
Patient with urinary retention for four days, now has fever and lower back pain.  Patient also reporting nausea but has not vomited.  Body aches and cold sweats.  Patient had this before when they tried a new antidepressant.  He was recently put on Zoloft.

## 2014-05-30 LAB — URINE CULTURE
Colony Count: NO GROWTH
Culture: NO GROWTH

## 2014-05-30 MED ORDER — CIPROFLOXACIN HCL 500 MG PO TABS: 500.0000 mg | ORAL_TABLET | Freq: Two times a day (BID) | ORAL | Status: DC

## 2014-05-30 NOTE — ED Provider Notes (Signed)
CSN: [id number]     Arrival date & time 05/29/14  1034 History   First MD Initiated Contact with Patient 05/29/14 1114     Chief Complaint  Patient presents with  . Urinary Retention      HPI Patient reports some urinary hesitancy over the past several days.  He thinks this is secondary to Zofran.  His had nausea without vomiting.  He reports some body aches and cold sweats.  He was noted to have fever of 101.9 today.  He denies lower abdominal pain.  Denies rectal pain.  No testicular or penile pain.  He reports cough congestion upper respiratory symptoms over the past several days as well.  He feels like his symptoms are consistent with his prior bronchitis.  No significant shortness of breath.  Past Medical History  Diagnosis Date  . Anxiety   . Chronic back pain   . Seizures   . COPD (chronic obstructive pulmonary disease)   . Hypertension    Past Surgical History  Procedure Laterality Date  . Back surgery    . Appendectomy    . Ankle surgery     Family History  Problem Relation Age of Onset  . CAD Other   . Cancer Other   . Diabetes Other    History  Substance Use Topics  . Smoking status: Current Every Day Smoker    Types: Cigarettes  . Smokeless tobacco: Not on file  . Alcohol Use: Yes     Comment: social     Review of Systems  All other systems reviewed and are negative.     Allergies  Penicillins; Ibuprofen; Nsaids; and Erythromycin  Home Medications   Prior to Admission medications   Medication Sig Start Date End Date Taking? Authorizing Provider  acetaminophen (TYLENOL) 500 MG tablet Take 500 mg by mouth every 6 (six) hours as needed for moderate pain.   Yes Historical Provider, MD  albuterol (PROVENTIL HFA;VENTOLIN HFA) 108 (90 BASE) MCG/ACT inhaler Inhale 2 puffs into the lungs every 6 (six) hours as needed for wheezing or shortness of breath.   Yes Historical Provider, MD  atenolol-chlorthalidone (TENORETIC) 50-25 MG per tablet Take 1 tablet by  mouth daily.    Yes Historical Provider, MD  buPROPion (WELLBUTRIN XL) 300 MG 24 hr tablet Take 300 mg by mouth every morning.    Yes Historical Provider, MD  clonazePAM (KLONOPIN) 1 MG tablet Take 1 mg by mouth 4 (four) times daily.    Yes Historical Provider, MD  fluticasone (FLONASE) 50 MCG/ACT nasal spray Place 1-2 sprays into both nostrils daily as needed for allergies or rhinitis.    Yes Historical Provider, MD  HYDROcodone-acetaminophen (NORCO/VICODIN) 5-325 MG per tablet Take 1 tablet by mouth every 6 (six) hours as needed for moderate pain.   Yes Historical Provider, MD  lidocaine (LIDODERM) 5 % Place 1 patch onto the skin once as needed (back pain.). Remove & Discard patch within 12 hours or as directed by MD   Yes Historical Provider, MD  lisinopril (PRINIVIL,ZESTRIL) 5 MG tablet Take 5 mg by mouth at bedtime.    Yes Historical Provider, MD  loratadine (CLARITIN) 10 MG tablet Take 10 mg by mouth every morning.    Yes Historical Provider, MD  methocarbamol (ROBAXIN) 500 MG tablet Take 500 mg by mouth 2 (two) times daily.   Yes Historical Provider, MD  omeprazole (PRILOSEC) 40 MG capsule Take 40 mg by mouth daily.    Yes Historical Provider, MD  sertraline (  ZOLOFT) 100 MG tablet Take 100 mg by mouth every morning.    Yes Historical Provider, MD  tiZANidine (ZANAFLEX) 4 MG tablet Take 1 tablet by mouth 2 (two) times daily as needed for muscle spasms.  02/05/14  Yes Historical Provider, MD  traMADol (ULTRAM) 50 MG tablet Take 50 mg by mouth every 6 (six) hours as needed for moderate pain.    Yes Historical Provider, MD  azithromycin (ZITHROMAX Z-PAK) 250 MG tablet Take 1 tablet (250 mg total) by mouth daily. Take 2 tabs for first dose, then 1 tab for each additional dose 05/29/14   Lyanne CoKevin M Demitrios Molyneux, MD   BP 144/93  Pulse 85  Temp(Src) 98.6 F (37 C) (Oral)  Resp 20  Wt 168 lb (76.204 kg)  SpO2 95% Physical Exam  Nursing note and vitals reviewed. Constitutional: He is oriented to person,  place, and time. He appears well-developed and well-nourished.  HENT:  Head: Normocephalic and atraumatic.  Eyes: EOM are normal.  Neck: Normal range of motion.  Cardiovascular: Normal rate, regular rhythm, normal heart sounds and intact distal pulses.   Pulmonary/Chest: Effort normal and breath sounds normal. No respiratory distress.  Abdominal: Soft. He exhibits no distension. There is no tenderness.  Musculoskeletal: Normal range of motion.  Neurological: He is alert and oriented to person, place, and time.  Skin: Skin is warm and dry.  Psychiatric: He has a normal mood and affect. Judgment normal.    ED Course  Procedures (including critical care time) Labs Review Labs Reviewed  URINE CULTURE  URINALYSIS, ROUTINE W REFLEX MICROSCOPIC    Imaging Review No results found.   EKG Interpretation None      MDM   Final diagnoses:  Urinary hesitancy  Upper respiratory tract infection  Fever, unspecified fever cause   Patient seems to be feeling much better at this time.  Patient discharged home with azithromycin as the fever was thought to be secondary to upper respiratory tract infection.  No hypoxia no indication for x-ray.  As it was completed the patient's chart I wondered more about the possibility of prostatitis.  The patient had been discharged home at this time.  I called him today, at 1:30 PM Sunday October 11th.  He told me still was not feeling great.  His had no nausea or vomiting.  He still having some low back pain and some urinary hesitancy.  I entertained the possibility of prostatitis.  [facility] on [address] [place].  Prescription was for 500 mg by mouth twice a day.  I told him to followup with me tomorrow morning in [facility] or to return to the ER sooner for any new or worsening symptoms.  He is agreeable and thankful to the additional outpatient  plan.     Lyanne CoKevin M Brittania Sudbeck, MD 05/30/14 1352

## 2014-05-31 ENCOUNTER — Encounter (HOSPITAL_COMMUNITY): Payer: Self-pay | Admitting: Emergency Medicine

## 2014-05-31 ENCOUNTER — Emergency Department (HOSPITAL_COMMUNITY)
Admission: EM | Admit: 2014-05-31 | Discharge: 2014-05-31 | Disposition: A | Discharge status: Home / Self Care | Payer: Medicare Other | Attending: Emergency Medicine | Admitting: Emergency Medicine

## 2014-05-31 ENCOUNTER — Emergency Department (HOSPITAL_COMMUNITY): Payer: Medicare Other

## 2014-05-31 DIAGNOSIS — N410 Acute prostatitis: Secondary | ICD-10-CM | POA: Diagnosis not present

## 2014-05-31 DIAGNOSIS — Z7951 Long term (current) use of inhaled steroids: Secondary | ICD-10-CM | POA: Insufficient documentation

## 2014-05-31 DIAGNOSIS — I10 Essential (primary) hypertension: Secondary | ICD-10-CM | POA: Insufficient documentation

## 2014-05-31 DIAGNOSIS — F419 Anxiety disorder, unspecified: Secondary | ICD-10-CM | POA: Insufficient documentation

## 2014-05-31 DIAGNOSIS — Z880 Allergy status to penicillin: Secondary | ICD-10-CM | POA: Diagnosis not present

## 2014-05-31 DIAGNOSIS — Z79899 Other long term (current) drug therapy: Secondary | ICD-10-CM | POA: Insufficient documentation

## 2014-05-31 DIAGNOSIS — Z720 Tobacco use: Secondary | ICD-10-CM | POA: Diagnosis not present

## 2014-05-31 DIAGNOSIS — M545 Low back pain: Secondary | ICD-10-CM | POA: Diagnosis present

## 2014-05-31 DIAGNOSIS — J449 Chronic obstructive pulmonary disease, unspecified: Secondary | ICD-10-CM | POA: Diagnosis not present

## 2014-05-31 DIAGNOSIS — Z9889 Other specified postprocedural states: Secondary | ICD-10-CM | POA: Diagnosis not present

## 2014-05-31 DIAGNOSIS — G8929 Other chronic pain: Secondary | ICD-10-CM | POA: Insufficient documentation

## 2014-05-31 LAB — BASIC METABOLIC PANEL
Anion gap: 11 (ref 5–15)
BUN: 9 mg/dL (ref 6–23)
CO2: 25 mEq/L (ref 19–32)
Calcium: 9 mg/dL (ref 8.4–10.5)
Chloride: 99 mEq/L (ref 96–112)
Creatinine, Ser: 0.86 mg/dL (ref 0.50–1.35)
GFR calc Af Amer: >90 mL/min (ref >90)
GFR calc non Af Amer: >90 mL/min (ref >90)
Glucose, Bld: 94 mg/dL (ref 70–99)
Potassium: 4.5 mEq/L (ref 3.7–5.3)
Sodium: 135 mEq/L — ABNORMAL LOW (ref 137–147)

## 2014-05-31 LAB — CBC
HCT: 42.1 % (ref 39.0–52.0)
Hemoglobin: 13.9 g/dL (ref 13.0–17.0)
MCH: 32.8 pg (ref 26.0–34.0)
MCHC: 33 g/dL (ref 30.0–36.0)
MCV: 99.3 fL (ref 78.0–100.0)
Platelets: 176 10*3/uL (ref 150–400)
RBC: 4.24 MIL/uL (ref 4.22–5.81)
RDW: 13.3 % (ref 11.5–15.5)
WBC: 7.5 10*3/uL (ref 4.0–10.5)

## 2014-05-31 LAB — URINALYSIS, ROUTINE W REFLEX MICROSCOPIC
BILIRUBIN URINE: NEGATIVE
GLUCOSE, UA: NEGATIVE mg/dL
HGB URINE DIPSTICK: NEGATIVE
KETONES UR: NEGATIVE mg/dL
Leukocytes, UA: NEGATIVE
NITRITE: NEGATIVE
PH: 6 (ref 5.0–8.0)
Protein, ur: NEGATIVE mg/dL
SPECIFIC GRAVITY, URINE: 1.01 (ref 1.005–1.030)
Urobilinogen, UA: 0.2 mg/dL (ref 0.0–1.0)

## 2014-05-31 MED ORDER — MORPHINE SULFATE 4 MG/ML IJ SOLN
6.0000 mg | Freq: Once | INTRAMUSCULAR | Status: AC
  Administered 2014-05-31: 6 mg via INTRAVENOUS
  Filled 2014-05-31: qty 2

## 2014-05-31 MED ORDER — IOHEXOL 300 MG/ML  SOLN
100.0000 mL | Freq: Once | INTRAMUSCULAR | Status: AC | PRN
  Administered 2014-05-31: 100 mL via INTRAVENOUS

## 2014-05-31 MED ORDER — IOHEXOL 300 MG/ML  SOLN
50.0000 mL | Freq: Once | INTRAMUSCULAR | Status: AC | PRN
  Administered 2014-05-31: 50 mL via ORAL

## 2014-05-31 MED ORDER — OXYCODONE-ACETAMINOPHEN 5-325 MG PO TABS: 1.0000 | ORAL_TABLET | Freq: Four times a day (QID) | ORAL | Status: DC | PRN

## 2014-05-31 NOTE — Discharge Instructions (Signed)

## 2014-05-31 NOTE — ED Notes (Signed)
Pt has urinal and made aware of need for urine specimen. 

## 2014-05-31 NOTE — ED Provider Notes (Signed)
CSN: [id number]     Arrival date & time 05/31/14  0802 History   First MD Initiated Contact with Patient 05/31/14 [phone]     Chief Complaint  Patient presents with  . Back Pain      HPI Patient presents emergency department complaining of some ongoing urinary hesitancy as well as ongoing low back pain.  He was seen emergency room several days ago by myself.  I called the patient yesterday at home and he was continued to have some discomfort and therefore prescription for ciprofloxacin was written for for possible development developing prostatitis I asked the patient to come back to be evaluated by myself today.  He states no nausea or vomiting.  He denies abdominal pain.  No chest pain or shortness of breath.  His upper respiratory symptoms are improving.  He didn't fill the prescription for ciprofloxacin is taking it since yesterday.  He states some ongoing discomfort and pain.   Past Medical History  Diagnosis Date  . Anxiety   . Chronic back pain   . Seizures   . COPD (chronic obstructive pulmonary disease)   . Hypertension    Past Surgical History  Procedure Laterality Date  . Back surgery    . Appendectomy    . Ankle surgery     Family History  Problem Relation Age of Onset  . CAD Other   . Cancer Other   . Diabetes Other    History  Substance Use Topics  . Smoking status: Current Every Day Smoker -- 0.50 packs/day    Types: Cigarettes  . Smokeless tobacco: Not on file  . Alcohol Use: Yes     Comment: social     Review of Systems  All other systems reviewed and are negative.     Allergies  Penicillins; Ibuprofen; Nsaids; and Erythromycin  Home Medications   Prior to Admission medications   Medication Sig Start Date End Date Taking? Authorizing Provider  acetaminophen (TYLENOL) 500 MG tablet Take 500 mg by mouth every 6 (six) hours as needed for moderate pain.   Yes Historical Provider, MD  albuterol (PROVENTIL HFA;VENTOLIN HFA) 108 (90 BASE) MCG/ACT  inhaler Inhale 2 puffs into the lungs every 6 (six) hours as needed for wheezing or shortness of breath.   Yes Historical Provider, MD  atenolol-chlorthalidone (TENORETIC) 50-25 MG per tablet Take 1 tablet by mouth daily.    Yes Historical Provider, MD  azithromycin (ZITHROMAX Z-PAK) 250 MG tablet Take 1 tablet (250 mg total) by mouth daily. Take 2 tabs for first dose, then 1 tab for each additional dose 05/29/14  Yes Lyanne CoKevin M Abb Gobert, MD  buPROPion (WELLBUTRIN XL) 300 MG 24 hr tablet Take 300 mg by mouth every morning.    Yes Historical Provider, MD  ciprofloxacin (CIPRO) 500 MG tablet Take 1 tablet (500 mg total) by mouth 2 (two) times daily. 05/30/14  Yes Lyanne CoKevin M Gregrey Bloyd, MD  clonazePAM (KLONOPIN) 1 MG tablet Take 1 mg by mouth 4 (four) times daily.    Yes Historical Provider, MD  fluticasone (FLONASE) 50 MCG/ACT nasal spray Place 1-2 sprays into both nostrils daily as needed for allergies or rhinitis.    Yes Historical Provider, MD  HYDROcodone-acetaminophen (NORCO/VICODIN) 5-325 MG per tablet Take 1 tablet by mouth every 6 (six) hours as needed for moderate pain.   Yes Historical Provider, MD  lidocaine (LIDODERM) 5 % Place 1 patch onto the skin once as needed (back pain.). Remove & Discard patch within 12 hours or as  directed by MD   Yes Historical Provider, MD  lisinopril (PRINIVIL,ZESTRIL) 5 MG tablet Take 5 mg by mouth at bedtime.    Yes Historical Provider, MD  loratadine (CLARITIN) 10 MG tablet Take 10 mg by mouth every morning.    Yes Historical Provider, MD  methocarbamol (ROBAXIN) 500 MG tablet Take 500 mg by mouth 2 (two) times daily.   Yes Historical Provider, MD  omeprazole (PRILOSEC) 40 MG capsule Take 40 mg by mouth daily.    Yes Historical Provider, MD  sertraline (ZOLOFT) 100 MG tablet Take 100 mg by mouth every morning.    Yes Historical Provider, MD  tiZANidine (ZANAFLEX) 4 MG tablet Take 1 tablet by mouth 2 (two) times daily as needed for muscle spasms.  02/05/14  Yes Historical  Provider, MD  traMADol (ULTRAM) 50 MG tablet Take 50 mg by mouth every 6 (six) hours as needed for moderate pain.    Yes Historical Provider, MD  oxyCODONE-acetaminophen (PERCOCET/ROXICET) 5-325 MG per tablet Take 1 tablet by mouth every 6 (six) hours as needed for severe pain. 05/31/14   Kellyann Ordway M Carletha Dawn, MD   BP 131/90  Pulse 66  Temp(Src) 97.8 F (36.6 C) (Oral)  Resp 18  SpO2 97% Physical Exam  Nursing note and vitals reviewed. Constitutional: He is oriented to person, place, and time. He appears well-developed and well-nourished.  HENT:  Head: Normocephalic and atraumatic.  Eyes: EOM are normal.  Neck: Normal range of motion.  Cardiovascular: Normal rate, regular rhythm, normal heart sounds and intact distal pulses.   Pulmonary/Chest: Effort normal and breath sounds normal. No respiratory distress.  Abdominal: Soft. He exhibits no distension. There is no tenderness.  Genitourinary:  Mild prostatic tenderness on digital rectal exam.  No obvious bogginess felt.  No obvious prostatic mass felt.  Musculoskeletal: Normal range of motion.  Neurological: He is alert and oriented to person, place, and time.  Skin: Skin is warm and dry.  Psychiatric: He has a normal mood and affect. Judgment normal.    ED Course  Procedures (including critical care time) Labs Review Labs Reviewed  BASIC METABOLIC PANEL - Abnormal; Notable for the following:    Sodium 135 (*)    All other components within normal limits  CBC  URINALYSIS, ROUTINE W REFLEX MICROSCOPIC    Imaging Review Ct Abdomen Pelvis W Contrast  05/31/2014   CLINICAL DATA:  43 year old male with bilateral flank pain for 2 weeks. Urgency, chills and difficulty urinating for 6 days. Low grade fever. Question prostatitis or abscess. History of kidney stones. Post appendectomy and back surgery. Initial encounter.  EXAM: CT ABDOMEN AND PELVIS WITH CONTRAST  TECHNIQUE: Multidetector CT imaging of the abdomen and pelvis was performed  using the standard protocol following bolus administration of intravenous contrast.  CONTRAST:  [facility] C916L[facility] University Children'S HospitaRArJae Dir nMLake Lansing Asc Partner515 [facility] Surgery CenteRArJae Dir nMHouston Physicians' Hos(98[facility] Children-ShreveporRArJae Dir nMThe [facility]ini8[facility] Dba [facility] Surgery Center Of SarasotRArJae Dir nMNantucket Cottage Hos(973)Turning Point HospitaRArJae Dir nMPortland C780Endoscopic Surgical Centre Of MarylanRArJae Dir nMAdvanced Eye Surgery C585 127 A[facility] Regional Medical CenteRArJae Dir nMSan Juan Va Medical C506 73Va Medical Center [facility] Manhattan CampuRArJae Dir nMPam Specialty Hospital Of Victoria 386-Endoscopy Center At SkyparRArJae Dir nMColmery-O'Neil Va Medical C219-52[facility] LLRArJae Dir nMErie Veterans Affairs Medical C418-Fa[facility] Regional Health ServiceRArJae Dir nMCleveland Clinic Avon Hos770-308-[facility] CenteRArJae Dir nM[facility] Shelby617 24[facility] HealtRArJae Dir nMMercy Specialty Hospital Of Southeast [phone] Medical CenteRArJae Dir nMSurgicare Surgical Associates Of OradelRussell County Medical CenteRArJae Dir nMThe [facility] C989-Bays[facility] Community HospitaRArJae Dir nMAuburn Surgery Cente312Orthoatlanta Surgery Center Of F[facility] LLRArJae Dir<MEASUREWatertown Regional Medical CRol97ArJae Dir nMEssentia Health St Joseph352-The Surgery Center Of HuntsvillRArJae Dir nMEye Surgery Center Of The D314-7St [facility] Mercy HospitaArJae Dir nMBaptist Emergency Hos423-[facility] InfirmarRArJae Dir nMSummit Pacific Medical C520-7[facility] D[facility] TenayRArJae Dir nMStillwater Hospital [phone] HospitaRArJae Dir nMColumbus Endoscopy Cente304-139Seabro[place] HousRArJae DireinMeritorn HahnncEXOL 300 MG/ML  SOLN  COMPARISON:  10/06/2006 CT.  FINDINGS: Minimal atelectatic changes lung bases.  No renal or ureteral obstructing stone or findings of hydronephrosis.  Noncontrast filled views of the urinary bladder unremarkable.  Slightly asymmetric degree of enhancement of the prostate gland of questionable significance. No evidence of surrounding inflammation or abscess identified.  Nonspecific slightly prominent size fluid-filled loops of small bowel without extra luminal bowel inflammatory process, free fluid or free air.  No worrisome hepatic, splenic, pancreatic, adrenal or renal lesion. No calcified gallstones.  Prior left L4-5 laminectomy with degenerative changes L4-5 level without worrisome osseous destructive lesion noted.  Atherosclerotic type changes lower abdominal aorta with small calcified plaque.  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  No adenopathy.  No bowel containing hernia.  IMPRESSION: Slightly asymmetric degree of enhancement of the prostate gland of questionable significance. No evidence of surrounding inflammation or abscess identified.  No renal or ureteral obstructing stone or findings of hydronephrosis.  Nonspecific slightly prominent size fluid-filled loops of small bowel without extra luminal bowel inflammatory process, free fluid or free air.  Prior left L4-5 laminectomy with degenerative changes L4-5 level.  Atherosclerotic type changes lower abdominal aorta with small calcified plaque. 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   Electronically Signed   By: Bridgett Larsson M.D.   On: 05/31/2014 10:05     EKG Interpretation None      MDM   Final diagnoses:  Acute prostatitis    Patient is feeling better at this time.  Clinically it sounds as though the patient has  prostatitis he will continue on the ciprofloxacin at this time.  There is some asymmetric enhancement of the prostate gland on CT scan but no prostatic abscess is noted.  Urine culture from recent visit was reviewed and is negative for bacterial growth    Lyanne Co, MD 05/31/14 1551

## 2014-05-31 NOTE — ED Notes (Signed)
[facility]. Pt recent discharge with urinary hesitancy, upper respiratory tract infection, and fever. Pt reports continued lower back pain, difficulty urinating, and chills. Pt denies hematuria or dysuria.

## 2014-06-13 ENCOUNTER — Emergency Department (HOSPITAL_COMMUNITY)
Admission: EM | Admit: 2014-06-13 | Discharge: 2014-06-13 | Disposition: A | Discharge status: Home / Self Care | Payer: Medicare Other | Attending: Emergency Medicine | Admitting: Emergency Medicine

## 2014-06-13 ENCOUNTER — Emergency Department (HOSPITAL_COMMUNITY): Payer: Medicare Other

## 2014-06-13 ENCOUNTER — Encounter (HOSPITAL_COMMUNITY): Payer: Self-pay | Admitting: Emergency Medicine

## 2014-06-13 DIAGNOSIS — R569 Unspecified convulsions: Secondary | ICD-10-CM | POA: Insufficient documentation

## 2014-06-13 DIAGNOSIS — Z79899 Other long term (current) drug therapy: Secondary | ICD-10-CM | POA: Diagnosis not present

## 2014-06-13 DIAGNOSIS — Z880 Allergy status to penicillin: Secondary | ICD-10-CM | POA: Diagnosis not present

## 2014-06-13 DIAGNOSIS — I10 Essential (primary) hypertension: Secondary | ICD-10-CM | POA: Diagnosis not present

## 2014-06-13 DIAGNOSIS — Z792 Long term (current) use of antibiotics: Secondary | ICD-10-CM | POA: Insufficient documentation

## 2014-06-13 DIAGNOSIS — S50812A Abrasion of left forearm, initial encounter: Secondary | ICD-10-CM | POA: Insufficient documentation

## 2014-06-13 DIAGNOSIS — Z720 Tobacco use: Secondary | ICD-10-CM | POA: Diagnosis not present

## 2014-06-13 DIAGNOSIS — S24109A Unspecified injury at unspecified level of thoracic spinal cord, initial encounter: Secondary | ICD-10-CM | POA: Insufficient documentation

## 2014-06-13 DIAGNOSIS — S0990XA Unspecified injury of head, initial encounter: Secondary | ICD-10-CM | POA: Insufficient documentation

## 2014-06-13 DIAGNOSIS — G8929 Other chronic pain: Secondary | ICD-10-CM | POA: Insufficient documentation

## 2014-06-13 DIAGNOSIS — M549 Dorsalgia, unspecified: Secondary | ICD-10-CM

## 2014-06-13 DIAGNOSIS — F419 Anxiety disorder, unspecified: Secondary | ICD-10-CM | POA: Diagnosis not present

## 2014-06-13 MED ORDER — OXYCODONE-ACETAMINOPHEN 5-325 MG PO TABS
1.0000 | ORAL_TABLET | Freq: Once | ORAL | Status: AC
  Administered 2014-06-13: 1 via ORAL
  Filled 2014-06-13: qty 1

## 2014-06-13 MED ORDER — CYCLOBENZAPRINE HCL 10 MG PO TABS: 10.0000 mg | ORAL_TABLET | Freq: Two times a day (BID) | ORAL | Status: DC | PRN

## 2014-06-13 MED ORDER — HYDROCODONE-ACETAMINOPHEN 5-325 MG PO TABS: 1.0000 | ORAL_TABLET | ORAL | Status: DC | PRN
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
## 2014-06-13 NOTE — ED Notes (Signed)
Pt instructed to take BP medication when he gets home. Resp even and unlabored. No audible adventitious breath sounds noted. ABC's intact. NAD noted

## 2014-06-13 NOTE — ED Provider Notes (Signed)
Medical screening examination/treatment/procedure(s) were performed by non-physician practitioner and as supervising physician I was immediately available for consultation/collaboration.   EKG Interpretation None      Devoria AlbeIva Shown Dissinger, MD, Armando GangFACEP   Ward GivensIva L Tonianne Fine, MD 06/13/14 2230

## 2014-06-13 NOTE — Discharge Instructions (Signed)
Norco Flexeril for pain and muscle spasms. Try ice, rest, follow-up with your doctor as needed   Back Pain, Adult Low back pain is very common. About 1 in 5 people have back pain.The cause of low back pain is rarely dangerous. The pain often gets better over time.About half of people with a sudden onset of back pain feel better in just 2 weeks. About 8 in 10 people feel better by 6 weeks.  CAUSES Some common causes of back pain include:  Strain of the muscles or ligaments supporting the spine.  Wear and tear (degeneration) of the spinal discs.  Arthritis.  Direct injury to the back. DIAGNOSIS Most of the time, the direct cause of low back pain is not known.However, back pain can be treated effectively even when the exact cause of the pain is unknown.Answering your caregiver's questions about your overall health and symptoms is one of the most accurate ways to make sure the cause of your pain is not dangerous. If your caregiver needs more information, he or she may order lab work or imaging tests (X-rays or MRIs).However, even if imaging tests show changes in your back, this usually does not require surgery. HOME CARE INSTRUCTIONS For many people, back pain returns.Since low back pain is rarely dangerous, it is often a condition that people can learn to [facility] Center LLCmanageon their own.   Remain active. It is stressful on the back to sit or stand in one place. Do not sit, drive, or stand in one place for more than 30 minutes at a time. Take short walks on level surfaces as soon as pain allows.Try to increase the length of time you walk each day.  Do not stay in bed.Resting more than 1 or 2 days can delay your recovery.  Do not avoid exercise or work.Your body is made to move.It is not dangerous to be active, even though your back may hurt.Your back will likely heal faster if you return to being active before your pain is gone.  Pay attention to your body when you bend and lift. Many people  have less discomfortwhen lifting if they bend their knees, keep the load close to their bodies,and avoid twisting. Often, the most comfortable positions are those that put less stress on your recovering back.  Find a comfortable position to sleep. Use a firm mattress and lie on your side with your knees slightly bent. If you lie on your back, put a pillow under your knees.  Only take over-the-counter or prescription medicines as directed by your caregiver. Over-the-counter medicines to reduce pain and inflammation are often the most helpful.Your caregiver may prescribe muscle relaxant drugs.These medicines help dull your pain so you can more quickly return to your normal activities and healthy exercise.  Put ice on the injured area.  Put ice in a plastic bag.  Place a towel between your skin and the bag.  Leave the ice on for 15-20 minutes, 03-04 times a day for the first 2 to 3 days. After that, ice and heat may be alternated to reduce pain and spasms.  Ask your caregiver about trying back exercises and gentle massage. This may be of some benefit.  Avoid feeling anxious or stressed.Stress increases muscle tension and can worsen back pain.It is important to recognize when you are anxious or stressed and learn ways to manage it.Exercise is a great option. SEEK MEDICAL CARE IF:  You have pain that is not relieved with rest or medicine.  You have pain that does  not improve in 1 week.  You have new symptoms.  You are generally not feeling well. SEEK IMMEDIATE MEDICAL CARE IF:   You have pain that radiates from your back into your legs.  You develop new bowel or bladder control problems.  You have unusual weakness or numbness in your arms or legs.  You develop nausea or vomiting.  You develop abdominal pain.  You feel faint. Document Released: 08/06/2005 Document Revised: 02/05/2012 Document Reviewed: 12/08/2013 [facility] Patient Information 2015 [place], [place]. This  information is not intended to replace advice given to you by your health care provider. Make sure you discuss any questions you have with your health care provider.
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
## 2015-01-28 ENCOUNTER — Inpatient Hospital Stay (HOSPITAL_COMMUNITY)
Admission: EM | Admit: 2015-01-28 | Discharge: 2015-01-31 | DRG: 684 | Disposition: A | Discharge status: Home / Self Care | Payer: Medicare Other | Attending: Internal Medicine | Admitting: Internal Medicine

## 2015-01-28 ENCOUNTER — Emergency Department (HOSPITAL_COMMUNITY): Payer: Medicare Other

## 2015-01-28 ENCOUNTER — Encounter (HOSPITAL_COMMUNITY): Payer: Self-pay | Admitting: Emergency Medicine

## 2015-01-28 DIAGNOSIS — Z880 Allergy status to penicillin: Secondary | ICD-10-CM | POA: Diagnosis not present

## 2015-01-28 DIAGNOSIS — A419 Sepsis, unspecified organism: Secondary | ICD-10-CM

## 2015-01-28 DIAGNOSIS — J449 Chronic obstructive pulmonary disease, unspecified: Secondary | ICD-10-CM | POA: Diagnosis present

## 2015-01-28 DIAGNOSIS — Z79891 Long term (current) use of opiate analgesic: Secondary | ICD-10-CM | POA: Diagnosis not present

## 2015-01-28 DIAGNOSIS — N179 Acute kidney failure, unspecified: Principal | ICD-10-CM | POA: Diagnosis present

## 2015-01-28 DIAGNOSIS — G8929 Other chronic pain: Secondary | ICD-10-CM | POA: Diagnosis present

## 2015-01-28 DIAGNOSIS — Z833 Family history of diabetes mellitus: Secondary | ICD-10-CM | POA: Diagnosis not present

## 2015-01-28 DIAGNOSIS — R55 Syncope and collapse: Secondary | ICD-10-CM | POA: Diagnosis present

## 2015-01-28 DIAGNOSIS — R0789 Other chest pain: Secondary | ICD-10-CM | POA: Diagnosis not present

## 2015-01-28 DIAGNOSIS — F419 Anxiety disorder, unspecified: Secondary | ICD-10-CM | POA: Diagnosis present

## 2015-01-28 DIAGNOSIS — R509 Fever, unspecified: Secondary | ICD-10-CM

## 2015-01-28 DIAGNOSIS — I959 Hypotension, unspecified: Secondary | ICD-10-CM | POA: Diagnosis present

## 2015-01-28 DIAGNOSIS — E860 Dehydration: Secondary | ICD-10-CM | POA: Diagnosis present

## 2015-01-28 DIAGNOSIS — R079 Chest pain, unspecified: Secondary | ICD-10-CM | POA: Diagnosis not present

## 2015-01-28 DIAGNOSIS — Z66 Do not resuscitate: Secondary | ICD-10-CM | POA: Diagnosis present

## 2015-01-28 DIAGNOSIS — Z881 Allergy status to other antibiotic agents status: Secondary | ICD-10-CM | POA: Diagnosis not present

## 2015-01-28 DIAGNOSIS — Z8249 Family history of ischemic heart disease and other diseases of the circulatory system: Secondary | ICD-10-CM | POA: Diagnosis not present

## 2015-01-28 DIAGNOSIS — I10 Essential (primary) hypertension: Secondary | ICD-10-CM | POA: Diagnosis present

## 2015-01-28 DIAGNOSIS — R197 Diarrhea, unspecified: Secondary | ICD-10-CM | POA: Diagnosis present

## 2015-01-28 DIAGNOSIS — IMO0001 Reserved for inherently not codable concepts without codable children: Secondary | ICD-10-CM | POA: Insufficient documentation

## 2015-01-28 DIAGNOSIS — M549 Dorsalgia, unspecified: Secondary | ICD-10-CM | POA: Diagnosis present

## 2015-01-28 DIAGNOSIS — Z79899 Other long term (current) drug therapy: Secondary | ICD-10-CM

## 2015-01-28 DIAGNOSIS — F17210 Nicotine dependence, cigarettes, uncomplicated: Secondary | ICD-10-CM | POA: Diagnosis present

## 2015-01-28 DIAGNOSIS — D72829 Elevated white blood cell count, unspecified: Secondary | ICD-10-CM | POA: Diagnosis present

## 2015-01-28 DIAGNOSIS — Z886 Allergy status to analgesic agent status: Secondary | ICD-10-CM

## 2015-01-28 DIAGNOSIS — Z87442 Personal history of urinary calculi: Secondary | ICD-10-CM | POA: Diagnosis not present

## 2015-01-28 DIAGNOSIS — N19 Unspecified kidney failure: Secondary | ICD-10-CM

## 2015-01-28 HISTORY — DX: Calculus of kidney: N20.0

## 2015-01-28 LAB — URINALYSIS, ROUTINE W REFLEX MICROSCOPIC
Glucose, UA: NEGATIVE mg/dL
Ketones, ur: NEGATIVE mg/dL
Leukocytes, UA: NEGATIVE
Nitrite: NEGATIVE
Protein, ur: 100 mg/dL — AB
Specific Gravity, Urine: 1.026 (ref 1.005–1.030)
Urobilinogen, UA: 0.2 mg/dL (ref 0.0–1.0)
pH: 5 (ref 5.0–8.0)

## 2015-01-28 LAB — CBC WITH DIFFERENTIAL/PLATELET
Basophils Absolute: 0.2 10*3/uL — ABNORMAL HIGH (ref 0.0–0.1)
Basophils Relative: 1 % (ref 0–1)
Eosinophils Absolute: 0.6 10*3/uL (ref 0.0–0.7)
Eosinophils Relative: 4 % (ref 0–5)
HCT: 43.8 % (ref 39.0–52.0)
Hemoglobin: 15.2 g/dL (ref 13.0–17.0)
Lymphocytes Relative: 29 % (ref 12–46)
Lymphs Abs: 4.4 10*3/uL — ABNORMAL HIGH (ref 0.7–4.0)
MCH: 34.4 pg — ABNORMAL HIGH (ref 26.0–34.0)
MCHC: 34.7 g/dL (ref 30.0–36.0)
MCV: 99.1 fL (ref 78.0–100.0)
Monocytes Absolute: 1.2 10*3/uL — ABNORMAL HIGH (ref 0.1–1.0)
Monocytes Relative: 8 % (ref 3–12)
Neutro Abs: 8.9 10*3/uL — ABNORMAL HIGH (ref 1.7–7.7)
Neutrophils Relative %: 58 % (ref 43–77)
Platelets: 283 10*3/uL (ref 150–400)
RBC: 4.42 MIL/uL (ref 4.22–5.81)
RDW: 12.7 % (ref 11.5–15.5)
WBC: 15.3 10*3/uL — ABNORMAL HIGH (ref 4.0–10.5)

## 2015-01-28 LAB — URINE MICROSCOPIC-ADD ON

## 2015-01-28 LAB — RAPID URINE DRUG SCREEN, HOSP PERFORMED
Amphetamines: NOT DETECTED
Barbiturates: NOT DETECTED
Benzodiazepines: POSITIVE — AB
Cocaine: NOT DETECTED
Opiates: POSITIVE — AB
Tetrahydrocannabinol: POSITIVE — AB

## 2015-01-28 LAB — COMPREHENSIVE METABOLIC PANEL
ALT: 11 U/L — ABNORMAL LOW (ref 17–63)
AST: 17 U/L (ref 15–41)
Albumin: 4.7 g/dL (ref 3.5–5.0)
Alkaline Phosphatase: 85 U/L (ref 38–126)
Anion gap: 15 (ref 5–15)
BUN: 26 mg/dL — ABNORMAL HIGH (ref 6–20)
CO2: 24 mmol/L (ref 22–32)
Calcium: 9.1 mg/dL (ref 8.9–10.3)
Chloride: 96 mmol/L — ABNORMAL LOW (ref 101–111)
Creatinine, Ser: 5.63 mg/dL — ABNORMAL HIGH (ref 0.61–1.24)
GFR calc Af Amer: 13 mL/min — ABNORMAL LOW (ref >60)
GFR calc non Af Amer: 11 mL/min — ABNORMAL LOW (ref >60)
Glucose, Bld: 141 mg/dL — ABNORMAL HIGH (ref 65–99)
Potassium: 3.1 mmol/L — ABNORMAL LOW (ref 3.5–5.1)
Sodium: 135 mmol/L (ref 135–145)
Total Bilirubin: 0.5 mg/dL (ref 0.3–1.2)
Total Protein: 8.6 g/dL — ABNORMAL HIGH (ref 6.5–8.1)

## 2015-01-28 LAB — CK: Total CK: 203 U/L (ref 49–397)

## 2015-01-28 LAB — I-STAT CG4 LACTIC ACID, ED: Lactic Acid, Venous: 0.73 mmol/L (ref 0.5–2.0)

## 2015-01-28 LAB — LACTIC ACID, PLASMA: Lactic Acid, Venous: 1 mmol/L (ref 0.5–2.0)

## 2015-01-28 LAB — TROPONIN I: Troponin I: <0.03 ng/mL (ref <0.031)

## 2015-01-28 MED ORDER — FENTANYL CITRATE (PF) 100 MCG/2ML IJ SOLN
75.0000 ug | Freq: Once | INTRAMUSCULAR | Status: AC
  Administered 2015-01-28: 75 ug via INTRAVENOUS
  Filled 2015-01-28: qty 2

## 2015-01-28 MED ORDER — SODIUM CHLORIDE 0.9 % IV BOLUS (SEPSIS)
2000.0000 mL | Freq: Once | INTRAVENOUS | Status: AC
Start: 2015-01-28 — End: 2015-01-28
  Administered 2015-01-28: 2000 mL via INTRAVENOUS

## 2015-01-28 MED ORDER — LEVOFLOXACIN IN D5W 750 MG/150ML IV SOLN
750.0000 mg | Freq: Once | INTRAVENOUS | Status: AC
  Administered 2015-01-28: 750 mg via INTRAVENOUS
  Filled 2015-01-28: qty 150

## 2015-01-28 MED ORDER — SODIUM CHLORIDE 0.9 % IV BOLUS (SEPSIS)
2000.0000 mL | Freq: Once | INTRAVENOUS | Status: AC
  Administered 2015-01-28: 2000 mL via INTRAVENOUS

## 2015-01-28 MED ORDER — ONDANSETRON HCL 4 MG/2ML IJ SOLN
4.0000 mg | Freq: Once | INTRAMUSCULAR | Status: AC
  Administered 2015-01-28: 4 mg via INTRAVENOUS
  Filled 2015-01-28: qty 2

## 2015-01-28 MED ORDER — VANCOMYCIN HCL 10 G IV SOLR
1500.0000 mg | Freq: Once | INTRAVENOUS | Status: AC
  Administered 2015-01-28: 1500 mg via INTRAVENOUS
  Filled 2015-01-28: qty 1500

## 2015-01-28 MED ORDER — OXYCODONE-ACETAMINOPHEN 5-325 MG PO TABS
1.0000 | ORAL_TABLET | Freq: Once | ORAL | Status: AC
  Administered 2015-01-28: 1 via ORAL
  Filled 2015-01-28: qty 1

## 2015-01-28 NOTE — ED Notes (Signed)
Witnessed foley catheter put in RN Sherrilyn Rist

## 2015-01-28 NOTE — ED Notes (Addendum)
Pt A&Ox4. Reports today he's been having dizzy spells where "I see black spots and I feel like I'm going to pass out. I've had a bad headache since Tuesday night." Also reports decreased urination flow d/t enlarged prostate but says this is not new. Able to answer all questions appropriately. Neurologically intact. Decreased strength on left leg d/t previous back surgery (x2). Endorses nausea with dry heaves but no vomiting or diarrhea. No recent sicknes/illness. No other c/c. Takes Lisinopril and Atenolol for HTN. Unsure if he has had a fever at home.

## 2015-01-28 NOTE — ED Notes (Signed)
Per patient, states his vision is blurred-has felt like he is disoriented and about to pass out-speech slurred appears over medicated

## 2015-01-28 NOTE — ED Notes (Signed)
Patient currently trying to give [place] urine sample

## 2015-01-28 NOTE — ED Provider Notes (Signed)
CSN: [id number]     Arrival date & time 01/28/15  1829 History   First MD Initiated Contact with Patient 01/28/15 1855     Chief Complaint  Patient presents with  . Blurred Vision     (Consider location/radiation/quality/duration/timing/severity/associated sxs/prior Treatment) HPI   44 year old male presenting with presyncopal symptoms. Onset last night. Several episodes where patient felt very dizzy as if he may pass out and had blurred vision. These both resolved slowly over a period of a few minutes. Chronic back pain. Denies CP or acute pain elsewhere. No fever or chills. Nausea and dry heaves for last week. Reports good PO intake. Decreased urinary output. "Weak stream" and "dribbles" for about a year but not really worse within the last few weeks or months. Reports seen at [facility] about a month ago and diagnosed with sinusitis. During this visit, "the doctor told me kidney number was a 'three' and it it should be a 'one' and he was concerned about it." Has yet to follow-up further.   Past Medical History  Diagnosis Date  . Anxiety   . Chronic back pain   . Seizures   . COPD (chronic obstructive pulmonary disease)   . Hypertension    Past Surgical History  Procedure Laterality Date  . Back surgery    . Appendectomy    . Ankle surgery     Family History  Problem Relation Age of Onset  . CAD Other   . Cancer Other   . Diabetes Other    History  Substance Use Topics  . Smoking status: Current Every Day Smoker -- 0.50 packs/day    Types: Cigarettes  . Smokeless tobacco: Not on file  . Alcohol Use: Yes     Comment: social     Review of Systems  All systems reviewed and negative, other than as noted in HPI.   Allergies  Penicillins; Ibuprofen; Nsaids; and Erythromycin  Home Medications   Prior to Admission medications   Medication Sig Start Date End Date Taking? Authorizing Provider  acetaminophen (TYLENOL) 500 MG tablet Take 500 mg by mouth every 6  (six) hours as needed for moderate pain.   Yes Historical Provider, MD  albuterol (PROVENTIL HFA;VENTOLIN HFA) 108 (90 BASE) MCG/ACT inhaler Inhale 2 puffs into the lungs every 6 (six) hours as needed for wheezing or shortness of breath.   Yes Historical Provider, MD  atenolol-chlorthalidone (TENORETIC) 100-25 MG per tablet Take 1 tablet by mouth daily.   Yes Historical Provider, MD  buPROPion (WELLBUTRIN XL) 300 MG 24 hr tablet Take 300 mg by mouth every morning.    Yes Historical Provider, MD  clonazePAM (KLONOPIN) 1 MG tablet Take 1 mg by mouth 2 (two) times daily as needed for anxiety.    Yes Historical Provider, MD  diphenhydrAMINE (SOMINEX) 25 MG tablet Take 50 mg by mouth at bedtime as needed for itching or allergies.   Yes Historical Provider, MD  fluticasone (FLONASE) 50 MCG/ACT nasal spray Place 1-2 sprays into both nostrils daily as needed for allergies or rhinitis.    Yes Historical Provider, MD  gabapentin (NEURONTIN) 300 MG capsule Take 300 mg by mouth 3 (three) times daily.   Yes Historical Provider, MD  HYDROcodone-acetaminophen (NORCO) 10-325 MG per tablet Take 1 tablet by mouth every 6 (six) hours as needed for moderate pain.  01/27/15  Yes Historical Provider, MD  lisinopril-hydrochlorothiazide (PRINZIDE,ZESTORETIC) 10-12.5 MG per tablet Take 1 tablet by mouth daily.   Yes Historical Provider, MD  loratadine (  CLARITIN) 10 MG tablet Take 10 mg by mouth every morning.    Yes Historical Provider, MD  omeprazole (PRILOSEC) 20 MG capsule Take 20 mg by mouth daily.   Yes Historical Provider, MD  sertraline (ZOLOFT) 100 MG tablet Take 100 mg by mouth every morning.    Yes Historical Provider, MD   BP 100/61 mmHg  Pulse 76  Temp(Src) 100.2 F (37.9 C) (Rectal)  Resp 13  SpO2 97% Physical Exam  Constitutional: He is oriented to person, place, and time. He appears well-developed and well-nourished. No distress.  HENT:  Head: Normocephalic and atraumatic.  Dry mucus membranes  Eyes:  Conjunctivae are normal. Right eye exhibits no discharge. Left eye exhibits no discharge.  Neck: Neck supple.  Cardiovascular: Normal rate, regular rhythm and normal heart sounds.  Exam reveals no gallop and no friction rub.   No murmur heard. Pulmonary/Chest: Effort normal and breath sounds normal. No respiratory distress.  Abdominal: Soft. He exhibits no distension. There is no tenderness.  Musculoskeletal: He exhibits no edema or tenderness.  Neurological: He is oriented to person, place, and time. No cranial nerve deficit. He exhibits normal muscle tone. Coordination normal.  Drowsy, but eyes open and answering questions appropriately. No focal deficit.   Skin: Skin is warm and dry.  Psychiatric: He has a normal mood and affect. His behavior is normal. Thought content normal.  Nursing note and vitals reviewed.   ED Course  Procedures (including critical care time)  CRITICAL CARE Performed by: Raeford Razor Total critical care time: 35 minutes Critical care time was exclusive of separately billable procedures and treating other patients. Critical care was necessary to treat or prevent imminent or life-threatening deterioration. Critical care was time spent personally by me on the following activities: development of treatment plan with patient and/or surrogate as well as nursing, discussions with consultants, evaluation of patient's response to treatment, examination of patient, obtaining history from patient or surrogate, ordering and performing treatments and interventions, ordering and review of laboratory studies, ordering and review of radiographic studies, pulse oximetry and re-evaluation of patient's condition.  Labs Review Labs Reviewed  CBC WITH DIFFERENTIAL/PLATELET - Abnormal; Notable for the following:    WBC 15.3 (*)    MCH 34.4 (*)    Neutro Abs 8.9 (*)    Lymphs Abs 4.4 (*)    Monocytes Absolute 1.2 (*)    Basophils Absolute 0.2 (*)    All other components within  normal limits  URINALYSIS, ROUTINE W REFLEX MICROSCOPIC (NOT AT [facility]) - Abnormal; Notable for the following:    Color, Urine AMBER (*)    APPearance CLOUDY (*)    Hgb urine dipstick MODERATE (*)    Bilirubin Urine SMALL (*)    Protein, ur 100 (*)    All other components within normal limits  COMPREHENSIVE METABOLIC PANEL - Abnormal; Notable for the following:    Potassium 3.1 (*)    Chloride 96 (*)    Glucose, Bld 141 (*)    BUN 26 (*)    Creatinine, Ser 5.63 (*)    Total Protein 8.6 (*)    ALT 11 (*)    GFR calc non Af Amer 11 (*)    GFR calc Af Amer 13 (*)    All other components within normal limits  URINE MICROSCOPIC-ADD ON - Abnormal; Notable for the following:    Casts HYALINE CASTS (*)    All other components within normal limits  CULTURE, BLOOD (ROUTINE X 2)  CULTURE, BLOOD (  ROUTINE X 2)  TROPONIN I  CK  LACTIC ACID, PLASMA  LACTIC ACID, PLASMA    Imaging Review Dg Chest 2 View  01/28/2015   CLINICAL DATA:  Fever for 3 days.  Low blood pressure.  EXAM: CHEST  2 VIEW  COMPARISON:  05/27/2013  FINDINGS: Lateral view degraded by patient arm position. Mild right hemidiaphragm elevation. Midline trachea. Normal heart size and mediastinal contours. No pleural effusion or pneumothorax. Clear lungs.  IMPRESSION: No acute cardiopulmonary disease.   Electronically Signed   By: Jeronimo Greaves M.D.   On: 01/28/2015 20:36     EKG Interpretation   Date/Time:  Friday January 28 2015 18:47:19 EDT Ventricular Rate:  68 PR Interval:  157 QRS Duration: 101 QT Interval:  375 QTC Calculation: 399 R Axis:   59 Text Interpretation:  Sinus rhythm Right atrial enlargement anterior ST  changes new from previous Confirmed by Lexington Devine  MD, Modena Bellemare ([id number]) on  01/28/2015 6:54:49 PM      MDM   Final diagnoses:  Fever  Near syncope  Hypotension, unspecified hypotension type  Renal failure    44yM with pre-syncopal symptoms. I suspect related to hypotension. Rectal temp 100.2 and intial  concern for possible sepsis. Empiric abx and blood cultures ordered. Requiring multiple fluid boluses to get to normotensive. W/u subsequently coming back with Cr of 5.8. Blood, protein and casts on UA. Eight months ago Cr was 0.86. From pt's description, it sound like Cr was probably around 3 a month ago. On HCTZ, chloralidone, lisinopril. Denies significant NSAIDs. Pt unable to urinate despite 4L IVF and placed foley for UA and ongoing monitoring of I/Os. I reassessed him ~15 minutes after placed and only about 100cc output. Lytes ok. Not acidotic. He is feeling better with improvement of BP but needs admission for further evaluation/management.     Raeford Razor, MD 01/29/15 [phone]

## 2015-01-28 NOTE — H&P (Addendum)
PCP:  [name], MD  Urology 2 years ago for urinary retention  Referring provider [place]   Chief Complaint:  presyncope  HPI: Eddie Williams is a 44 y.o. male   has a past medical history of Anxiety; Chronic back pain; Seizures; COPD (chronic obstructive pulmonary disease); Hypertension; and Kidney stones.   Presented with lightheadedness episodes today. He had an episode today when he was moving a yard when he felt lightheaded and presyncopal. He did not go to ER but had a repeat episode while urinating and decided to get checked out. He was told 2 months ago that his "kidney number was 3 and should be 1" while being evaluated in [place]. At that time he was started on doxycycline for treatment of sinusitis.  Repots headache, some epigastric/ chest pains and shortness of breath.  Reports weak urinary stream incomplete emptying for the past 1 year. In emergency department he was unable to provide urine of 40 was placed producing 200 mL of urine which appear to be concentrated.  Noted to have BG 234 yesterday he is not diabetic but check his blood sugar his mother's meter. Reports leg pains and cramps helped with Neurontin. He feels a bit lightheaded when he stands up. Reports a fever at home and  up to 100.2 in ER. He have had some cough, reports diarrhea in AM once a day loose stool.  States he has history of kidney stones as a young man but not recently. Reports remote use of cocaine but also not recently. Emergency palpation was noted to be hypotensive down to 80s he received 4 L of normal saline blood pressure improved to 110. Patient was a symptomatic throughout.    Hospitalist was called for admission for ARF  Review of Systems:    Pertinent positives include:  chest pain  Constitutional:  No weight loss, night sweats, Fevers, chills, fatigue, weight loss  HEENT:  No headaches, Difficulty swallowing,Tooth/dental problems,Sore throat,  No sneezing, itching, ear ache,  nasal congestion, post nasal drip,  Cardio-vascular:  No, Orthopnea, PND, anasarca, dizziness, palpitations.no Bilateral lower extremity swelling  GI:  No heartburn, indigestion, abdominal pain, nausea, vomiting, diarrhea, change in bowel habits, loss of appetite, melena, blood in stool, hematemesis Resp:  no shortness of breath at rest. No dyspnea on exertion, No excess mucus, no productive cough, No non-productive cough, No coughing up of blood.No change in color of mucus.No wheezing. Skin:  no rash or lesions. No jaundice GU:  no dysuria, change in color of urine, no urgency or frequency. No straining to urinate.  No flank pain.  Musculoskeletal:  No joint pain or no joint swelling. No decreased range of motion. No back pain.  Psych:  No change in mood or affect. No depression or anxiety. No memory loss.  Neuro: no localizing neurological complaints, no tingling, no weakness, no double vision, no gait abnormality, no slurred speech, no confusion  Otherwise ROS are negative except for above, 10 systems were reviewed  Past Medical History: Past Medical History  Diagnosis Date  . Anxiety   . Chronic back pain   . Seizures   . COPD (chronic obstructive pulmonary disease)   . Hypertension   . Kidney stones    Past Surgical History  Procedure Laterality Date  . Back surgery    . Appendectomy    . Ankle surgery       Medications: Prior to Admission medications   Medication Sig Start Date End Date Taking? Authorizing Provider  acetaminophen (TYLENOL) 500 MG tablet Take 500 mg by mouth every 6 (six) hours as needed for moderate pain.   Yes Historical Provider, MD  albuterol (PROVENTIL HFA;VENTOLIN HFA) 108 (90 BASE) MCG/ACT inhaler Inhale 2 puffs into the lungs every 6 (six) hours as needed for wheezing or shortness of breath.   Yes Historical Provider, MD  atenolol-chlorthalidone (TENORETIC) 100-25 MG per tablet Take 1 tablet by mouth daily.   Yes Historical Provider, MD    buPROPion (WELLBUTRIN XL) 300 MG 24 hr tablet Take 300 mg by mouth every morning.    Yes Historical Provider, MD  clonazePAM (KLONOPIN) 1 MG tablet Take 1 mg by mouth 2 (two) times daily as needed for anxiety.    Yes Historical Provider, MD  diphenhydrAMINE (SOMINEX) 25 MG tablet Take 50 mg by mouth at bedtime as needed for itching or allergies.   Yes Historical Provider, MD  fluticasone (FLONASE) 50 MCG/ACT nasal spray Place 1-2 sprays into both nostrils daily as needed for allergies or rhinitis.    Yes Historical Provider, MD  gabapentin (NEURONTIN) 300 MG capsule Take 300 mg by mouth 3 (three) times daily.   Yes Historical Provider, MD  HYDROcodone-acetaminophen (NORCO) 10-325 MG per tablet Take 1 tablet by mouth every 6 (six) hours as needed for moderate pain.  01/27/15  Yes Historical Provider, MD  lisinopril-hydrochlorothiazide (PRINZIDE,ZESTORETIC) 10-12.5 MG per tablet Take 1 tablet by mouth daily.   Yes Historical Provider, MD  loratadine (CLARITIN) 10 MG tablet Take 10 mg by mouth every morning.    Yes Historical Provider, MD  omeprazole (PRILOSEC) 20 MG capsule Take 20 mg by mouth daily.   Yes Historical Provider, MD  sertraline (ZOLOFT) 100 MG tablet Take 100 mg by mouth every morning.    Yes Historical Provider, MD    Allergies:   Allergies  Allergen Reactions  . Penicillins Anaphylaxis  . Ibuprofen Nausea Only  . Nsaids Nausea And Vomiting  . Erythromycin Rash    Social History:  Ambulatory  Independently  Lives at home  With girl friend     reports that he has been smoking Cigarettes.  He has been smoking about 0.50 packs per day. He does not have any smokeless tobacco history on file. He reports that he drinks alcohol. He reports that he uses illicit drugs ("Crack" cocaine and Marijuana).    Family History: family history includes CAD in his father and other; Cancer in his other; Depression in his mother; Diabetes in his other; Diabetes type II in his mother.     Physical Exam: Patient Vitals for the past 24 hrs:  BP Temp Temp src Pulse Resp SpO2  01/28/15 2230 (!) 86/61 mmHg - - - 13 -  01/28/15 2215 104/62 mmHg - - 83 18 92 %  01/28/15 2200 91/65 mmHg - - 73 13 99 %  01/28/15 2145 100/61 mmHg - - 76 13 97 %  01/28/15 2141 112/59 mmHg - - 75 17 100 %  01/28/15 2130 112/59 mmHg - - 79 26 98 %  01/28/15 2115 103/78 mmHg - - 75 18 92 %  01/28/15 2100 109/70 mmHg - - 78 13 94 %  01/28/15 2045 98/65 mmHg - - 78 15 95 %  01/28/15 2016 103/79 mmHg - - 78 17 99 %  01/28/15 2015 103/79 mmHg - - - 23 -  01/28/15 2000 92/68 mmHg - - - 24 -  01/28/15 1945 (!) 86/56 mmHg - - 65 24 98 %  01/28/15 1930 (!)  84/50 mmHg - - 64 17 99 %  01/28/15 1923 - 100.2 F (37.9 C) Rectal - - -  01/28/15 1915 (!) 83/48 mmHg - - 64 16 95 %  01/28/15 1900 92/57 mmHg - - 63 16 100 %  01/28/15 1847 (!) 84/52 mmHg - - - 17 -  01/28/15 1841 (!) 82/48 mmHg - - - - -  01/28/15 1840 (!) 81/47 mmHg 98.1 F (36.7 C) Oral 71 14 100 %    1. General:  in No Acute distress 2. Psychological: Alert and  Oriented 3. Head/ENT:     Dry Mucous Membranes                          Head Non traumatic, neck supple                          Normal   Dentition 4. SKIN:   decreased Skin turgor,  Skin clean Dry and intact no rash 5. Heart: Regular rate and rhythm no Murmur, Rub or gallop 6. Lungs: Clear to auscultation bilaterally, no wheezes or crackles   7. Abdomen: Soft, non-tender, Non distended 8. Lower extremities: no clubbing, cyanosis, or edema 9. Neurologically Grossly intact, moving all 4 extremities equally 10. MSK: Normal range of motion  body mass index is unknown because there is no weight on file.   Labs on Admission:   Results for orders placed or performed during the hospital encounter of 01/28/15 (from the past 24 hour(s))  Lactic acid, plasma     Status: None   Collection Time: 01/28/15  7:25 PM  Result Value Ref Range   Lactic Acid, Venous 1.0 0.5 - 2.0 mmol/L   CBC with Differential     Status: Abnormal   Collection Time: 01/28/15  7:26 PM  Result Value Ref Range   WBC 15.3 (H) 4.0 - 10.5 K/uL   RBC 4.42 4.22 - 5.81 MIL/uL   Hemoglobin 15.2 13.0 - 17.0 g/dL   HCT 81.1 91.4 - 78.2 %   MCV 99.1 78.0 - 100.0 fL   MCH 34.4 (H) 26.0 - 34.0 pg   MCHC 34.7 30.0 - 36.0 g/dL   RDW 95.6 21.3 - 08.6 %   Platelets 283 150 - 400 K/uL   Neutrophils Relative % 58 43 - 77 %   Lymphocytes Relative 29 12 - 46 %   Monocytes Relative 8 3 - 12 %   Eosinophils Relative 4 0 - 5 %   Basophils Relative 1 0 - 1 %   Neutro Abs 8.9 (H) 1.7 - 7.7 K/uL   Lymphs Abs 4.4 (H) 0.7 - 4.0 K/uL   Monocytes Absolute 1.2 (H) 0.1 - 1.0 K/uL   Eosinophils Absolute 0.6 0.0 - 0.7 K/uL   Basophils Absolute 0.2 (H) 0.0 - 0.1 K/uL   Smear Review MORPHOLOGY UNREMARKABLE   Troponin I     Status: None   Collection Time: 01/28/15  7:26 PM  Result Value Ref Range   Troponin I <0.03 <0.031 ng/mL  CK     Status: None   Collection Time: 01/28/15  7:26 PM  Result Value Ref Range   Total CK 203 49 - 397 U/L  Comprehensive metabolic panel     Status: Abnormal   Collection Time: 01/28/15  7:26 PM  Result Value Ref Range   Sodium 135 135 - 145 mmol/L   Potassium 3.1 (L) 3.5 - 5.1 mmol/L  Chloride 96 (L) 101 - 111 mmol/L   CO2 24 22 - 32 mmol/L   Glucose, Bld 141 (H) 65 - 99 mg/dL   BUN 26 (H) 6 - 20 mg/dL   Creatinine, Ser 3.15 (H) 0.61 - 1.24 mg/dL   Calcium 9.1 8.9 - 17.6 mg/dL   Total Protein 8.6 (H) 6.5 - 8.1 g/dL   Albumin 4.7 3.5 - 5.0 g/dL   AST 17 15 - 41 U/L   ALT 11 (L) 17 - 63 U/L   Alkaline Phosphatase 85 38 - 126 U/L   Total Bilirubin 0.5 0.3 - 1.2 mg/dL   GFR calc non Af Amer 11 (L) >60 mL/min   GFR calc Af Amer 13 (L) >60 mL/min   Anion gap 15 5 - 15  Urinalysis, Routine w reflex microscopic (not at [facility])     Status: Abnormal   Collection Time: 01/28/15  9:31 PM  Result Value Ref Range   Color, Urine AMBER (A) YELLOW   APPearance CLOUDY (A) CLEAR   Specific  Gravity, Urine 1.026 1.005 - 1.030   pH 5.0 5.0 - 8.0   Glucose, UA NEGATIVE NEGATIVE mg/dL   Hgb urine dipstick MODERATE (A) NEGATIVE   Bilirubin Urine SMALL (A) NEGATIVE   Ketones, ur NEGATIVE NEGATIVE mg/dL   Protein, ur 160 (A) NEGATIVE mg/dL   Urobilinogen, UA 0.2 0.0 - 1.0 mg/dL   Nitrite NEGATIVE NEGATIVE   Leukocytes, UA NEGATIVE NEGATIVE  Urine microscopic-add on     Status: Abnormal   Collection Time: 01/28/15  9:31 PM  Result Value Ref Range   Squamous Epithelial / LPF RARE RARE   WBC, UA 3-6 <3 WBC/hpf   RBC / HPF 7-10 <3 RBC/hpf   Bacteria, UA RARE RARE   Casts HYALINE CASTS (A) NEGATIVE   Urine-Other MUCOUS PRESENT   I-Stat CG4 Lactic Acid, ED     Status: None   Collection Time: 01/28/15 10:35 PM  Result Value Ref Range   Lactic Acid, Venous 0.73 0.5 - 2.0 mmol/L    UA evidence of RBC in Urine  No results found for: HGBA1C  CrCl cannot be calculated (Unknown ideal weight.).  BNP (last 3 results) No results for input(s): PROBNP in the last 8760 hours.  Other results:  I have pearsonaly reviewed this: ECG REPORT  Rate: 68  Rhythm: NSR ST&T Change: No ischemic changes QTC 399   There were no vitals filed for this visit.   Cultures:    Component Value Date/Time   SDES URINE, CLEAN CATCH 05/29/2014 1133   SPECREQUEST NONE 05/29/2014 1133   CULT NO GROWTH Performed at [facility] 05/29/2014 1133   REPTSTATUS 05/30/2014 FINAL 05/29/2014 1133     Radiological Exams on Admission: Dg Chest 2 View  01/28/2015   CLINICAL DATA:  Fever for 3 days.  Low blood pressure.  EXAM: CHEST  2 VIEW  COMPARISON:  05/27/2013  FINDINGS: Lateral view degraded by patient arm position. Mild right hemidiaphragm elevation. Midline trachea. Normal heart size and mediastinal contours. No pleural effusion or pneumothorax. Clear lungs.  IMPRESSION: No acute cardiopulmonary disease.   Electronically Signed   By: Jeronimo Greaves M.D.   On: 01/28/2015 20:36    Chart has  been reviewed  Family not  at  Bedside    Assessment/Plan 44 year old gentleman with history of hypertension and anxiety admitted to the acute renal failure and fever some evidence of dehydration and urinary retention   Present on Admission:  . Acute renal  failure  - will discontinue lisinopril and hydrochlorothiazide. Continue with IV fluids. Obtain urine electrolytes. He would benefit from nephrology consult. This point no indication for dialysis. Hopefully patient will recover we will check HIV status. If creatinine does not improve despite IV fluid resuscitation will need further workup   sepsis evidence of fever and elevated white blood cell count. Source unclear chest x-ray did not show any evidence of infiltrate. Will test for influenza and C. difficile given ongoing history of diarrhea and exposure to antibiotics area treat with broad-spectrum antibiotics for now until source is clear or blood cultures are negative Diarrhea - will obtain C. difficile PCR Urinary retention unclear etiology -once stable will re-assess he'll likely need follow-up with urology Elevated blood sugar. We'll check hemoglobin A1c patient does not have known history of diabetes but will continue to monitor blood glucose Near syncope in the setting of acute renal failure and dehydration will give IV fluids and check orthostatics tomorrow Chest pain atypical currently chest pain-free will cycle cardiac enzymes,  Prophylaxis: heparin [place]  CODE STATUS:    DNR/DNI as per patient  states if it's my time to go I do not want resuscitation   Disposition:   To home once workup is complete and patient is stable  Other plan as per orders.  I have spent a total of 55 min on this admission  Yoshika Vensel 01/28/2015, 10:54 PM  [facility]  Pager [phone]   after 2 AM please page floor coverage PA If 7AM-7PM, please contact the day team taking care of the patient  [email or web address]  Password [email or web address]   his mother's

## 2015-01-29 ENCOUNTER — Inpatient Hospital Stay (HOSPITAL_COMMUNITY): Payer: Medicare Other

## 2015-01-29 DIAGNOSIS — R079 Chest pain, unspecified: Secondary | ICD-10-CM

## 2015-01-29 DIAGNOSIS — N179 Acute kidney failure, unspecified: Principal | ICD-10-CM

## 2015-01-29 DIAGNOSIS — R197 Diarrhea, unspecified: Secondary | ICD-10-CM | POA: Diagnosis present

## 2015-01-29 LAB — CBC
HCT: 35.6 % — ABNORMAL LOW (ref 39.0–52.0)
HEMATOCRIT: 34 % — AB (ref 39.0–52.0)
HEMOGLOBIN: 11.2 g/dL — AB (ref 13.0–17.0)
HEMOGLOBIN: 11.7 g/dL — AB (ref 13.0–17.0)
MCH: 32.6 pg (ref 26.0–34.0)
MCH: 33.4 pg (ref 26.0–34.0)
MCHC: 32.9 g/dL (ref 30.0–36.0)
MCHC: 32.9 g/dL (ref 30.0–36.0)
MCV: 98.8 fL (ref 78.0–100.0)
MCV: 101.7 fL — AB (ref 78.0–100.0)
PLATELETS: 208 10*3/uL (ref 150–400)
Platelets: 190 10*3/uL (ref 150–400)
RBC: 3.44 MIL/uL — ABNORMAL LOW (ref 4.22–5.81)
RBC: 3.5 MIL/uL — AB (ref 4.22–5.81)
RDW: 12.9 % (ref 11.5–15.5)
RDW: 13.1 % (ref 11.5–15.5)
WBC: 11.5 10*3/uL — ABNORMAL HIGH (ref 4.0–10.5)
WBC: 10.8 10*3/uL — ABNORMAL HIGH (ref 4.0–10.5)

## 2015-01-29 LAB — PHOSPHORUS: PHOSPHORUS: 3.6 mg/dL (ref 2.5–4.6)

## 2015-01-29 LAB — COMPREHENSIVE METABOLIC PANEL
ALK PHOS: 64 U/L (ref 38–126)
ALT: 10 U/L — ABNORMAL LOW (ref 17–63)
ANION GAP: 10 (ref 5–15)
AST: 14 U/L — ABNORMAL LOW (ref 15–41)
Albumin: 3.4 g/dL — ABNORMAL LOW (ref 3.5–5.0)
BUN: 22 mg/dL — AB (ref 6–20)
CALCIUM: 7.9 mg/dL — AB (ref 8.9–10.3)
CO2: 25 mmol/L (ref 22–32)
Chloride: 103 mmol/L (ref 101–111)
Creatinine, Ser: 3.59 mg/dL — ABNORMAL HIGH (ref 0.61–1.24)
GFR calc Af Amer: 22 mL/min — ABNORMAL LOW (ref >60)
GFR calc non Af Amer: 19 mL/min — ABNORMAL LOW (ref >60)
GLUCOSE: 81 mg/dL (ref 65–99)
POTASSIUM: 3 mmol/L — AB (ref 3.5–5.1)
SODIUM: 138 mmol/L (ref 135–145)
Total Bilirubin: 0.7 mg/dL (ref 0.3–1.2)
Total Protein: 6.5 g/dL (ref 6.5–8.1)

## 2015-01-29 LAB — RAPID URINE DRUG SCREEN, HOSP PERFORMED
Amphetamines: NOT DETECTED
BENZODIAZEPINES: POSITIVE — AB
Barbiturates: NOT DETECTED
COCAINE: NOT DETECTED
Opiates: POSITIVE — AB
Tetrahydrocannabinol: NOT DETECTED

## 2015-01-29 LAB — MAGNESIUM
Magnesium: 1.6 mg/dL — ABNORMAL LOW (ref 1.7–2.4)
Magnesium: 1.5 mg/dL — ABNORMAL LOW (ref 1.7–2.4)

## 2015-01-29 LAB — TROPONIN I
Troponin I: <0.03 ng/mL (ref <0.031)
Troponin I: <0.03 ng/mL (ref <0.031)

## 2015-01-29 LAB — I-STAT CG4 LACTIC ACID, ED: LACTIC ACID, VENOUS: 0.44 mmol/L — AB (ref 0.5–2.0)

## 2015-01-29 LAB — HIV ANTIBODY (ROUTINE TESTING W REFLEX): HIV SCREEN 4TH GENERATION: NONREACTIVE

## 2015-01-29 LAB — TSH: TSH: 0.889 u[IU]/mL (ref 0.350–4.500)

## 2015-01-29 LAB — CREATININE, SERUM
CREATININE: 3.95 mg/dL — AB (ref 0.61–1.24)
GFR, EST AFRICAN AMERICAN: 20 mL/min — AB (ref >60)
GFR, EST NON AFRICAN AMERICAN: 17 mL/min — AB (ref >60)

## 2015-01-29 LAB — INFLUENZA PANEL BY PCR (TYPE A & B)
H1N1 flu by pcr: NOT DETECTED
Influenza A By PCR: NEGATIVE
Influenza B By PCR: NEGATIVE

## 2015-01-29 LAB — MRSA PCR SCREENING: MRSA by PCR: NEGATIVE

## 2015-01-29 LAB — PROCALCITONIN: Procalcitonin: 0.15 ng/mL

## 2015-01-29 LAB — SODIUM, URINE, RANDOM: SODIUM UR: 77 meq/L

## 2015-01-29 LAB — CREATININE, URINE, RANDOM: Creatinine, Urine: 259.3 mg/dL

## 2015-01-29 MED ORDER — POTASSIUM CHLORIDE CRYS ER 20 MEQ PO TBCR
40.0000 meq | EXTENDED_RELEASE_TABLET | Freq: Two times a day (BID) | ORAL | Status: AC
  Administered 2015-01-29 (×2): 40 meq via ORAL
  Filled 2015-01-29 (×2): qty 2

## 2015-01-29 MED ORDER — BUPROPION HCL ER (XL) 300 MG PO TB24
300.0000 mg | ORAL_TABLET | Freq: Every morning | ORAL | Status: DC
  Administered 2015-01-29 – 2015-01-31 (×3): 300 mg via ORAL
  Filled 2015-01-29 (×3): qty 1

## 2015-01-29 MED ORDER — GABAPENTIN 300 MG PO CAPS
300.0000 mg | ORAL_CAPSULE | Freq: Three times a day (TID) | ORAL | Status: DC
  Administered 2015-01-29 – 2015-01-31 (×7): 300 mg via ORAL
  Filled 2015-01-29 (×9): qty 1

## 2015-01-29 MED ORDER — LORATADINE 10 MG PO TABS
10.0000 mg | ORAL_TABLET | Freq: Every morning | ORAL | Status: DC
  Administered 2015-01-29 – 2015-01-31 (×3): 10 mg via ORAL
  Filled 2015-01-29 (×3): qty 1

## 2015-01-29 MED ORDER — DEXTROSE 5 % IV SOLN
1.0000 g | Freq: Three times a day (TID) | INTRAVENOUS | Status: DC
  Administered 2015-01-29 (×2): 1 g via INTRAVENOUS
  Filled 2015-01-29 (×4): qty 1

## 2015-01-29 MED ORDER — ALBUTEROL SULFATE HFA 108 (90 BASE) MCG/ACT IN AERS: 2.0000 | INHALATION_SPRAY | Freq: Four times a day (QID) | RESPIRATORY_TRACT | Status: DC | PRN

## 2015-01-29 MED ORDER — SODIUM CHLORIDE 0.9 % IV SOLN
INTRAVENOUS | Status: DC
  Administered 2015-01-29 – 2015-01-30 (×2): via INTRAVENOUS
  Administered 2015-01-30: 1000 mL via INTRAVENOUS

## 2015-01-29 MED ORDER — DEXTROSE 5 % IV SOLN
3.0000 g | Freq: Once | INTRAVENOUS | Status: AC
  Administered 2015-01-29: 3 g via INTRAVENOUS
  Filled 2015-01-29: qty 6

## 2015-01-29 MED ORDER — SODIUM CHLORIDE 0.9 % IV SOLN
INTRAVENOUS | Status: AC
  Administered 2015-01-29: 03:00:00 via INTRAVENOUS

## 2015-01-29 MED ORDER — ACETAMINOPHEN 650 MG RE SUPP
650.0000 mg | Freq: Four times a day (QID) | RECTAL | Status: DC | PRN
Start: 2015-01-29 — End: 2015-01-31

## 2015-01-29 MED ORDER — SODIUM CHLORIDE 0.9 % IV BOLUS (SEPSIS)
1000.0000 mL | Freq: Once | INTRAVENOUS | Status: AC
  Administered 2015-01-29: 1000 mL via INTRAVENOUS

## 2015-01-29 MED ORDER — FLUTICASONE PROPIONATE 50 MCG/ACT NA SUSP
1.0000 | Freq: Every day | NASAL | Status: DC | PRN
  Filled 2015-01-29: qty 16

## 2015-01-29 MED ORDER — POTASSIUM CHLORIDE CRYS ER 20 MEQ PO TBCR
20.0000 meq | EXTENDED_RELEASE_TABLET | Freq: Once | ORAL | Status: AC
  Administered 2015-01-29: 20 meq via ORAL
  Filled 2015-01-29: qty 1

## 2015-01-29 MED ORDER — ONDANSETRON HCL 4 MG/2ML IJ SOLN: 4.0000 mg | Freq: Four times a day (QID) | INTRAMUSCULAR | Status: DC | PRN

## 2015-01-29 MED ORDER — SERTRALINE HCL 100 MG PO TABS
100.0000 mg | ORAL_TABLET | Freq: Every morning | ORAL | Status: DC
  Administered 2015-01-29 – 2015-01-31 (×3): 100 mg via ORAL
  Filled 2015-01-29 (×3): qty 1

## 2015-01-29 MED ORDER — SODIUM CHLORIDE 0.9 % IJ SOLN
3.0000 mL | Freq: Two times a day (BID) | INTRAMUSCULAR | Status: DC
Start: 2015-01-29 — End: 2015-01-31
  Administered 2015-01-29 – 2015-01-31 (×3): 3 mL via INTRAVENOUS

## 2015-01-29 MED ORDER — VANCOMYCIN HCL IN DEXTROSE 1-5 GM/200ML-% IV SOLN: 1000.0000 mg | INTRAVENOUS | Status: DC

## 2015-01-29 MED ORDER — ONDANSETRON HCL 4 MG PO TABS: 4.0000 mg | ORAL_TABLET | Freq: Four times a day (QID) | ORAL | Status: DC | PRN

## 2015-01-29 MED ORDER — ALBUTEROL SULFATE (2.5 MG/3ML) 0.083% IN NEBU: 2.5000 mg | INHALATION_SOLUTION | Freq: Four times a day (QID) | RESPIRATORY_TRACT | Status: DC | PRN

## 2015-01-29 MED ORDER — HYDROCODONE-ACETAMINOPHEN 10-325 MG PO TABS
1.0000 | ORAL_TABLET | Freq: Four times a day (QID) | ORAL | Status: DC | PRN
  Administered 2015-01-29 (×3): 1 via ORAL
  Filled 2015-01-29 (×3): qty 1

## 2015-01-29 MED ORDER — HYDROCODONE-ACETAMINOPHEN 10-325 MG PO TABS
1.0000 | ORAL_TABLET | ORAL | Status: DC | PRN
  Administered 2015-01-29 – 2015-01-31 (×7): 1 via ORAL
  Filled 2015-01-29 (×8): qty 1

## 2015-01-29 MED ORDER — HEPARIN SODIUM (PORCINE) 5000 UNIT/ML IJ SOLN
5000.0000 [IU] | Freq: Three times a day (TID) | INTRAMUSCULAR | Status: DC
  Administered 2015-01-29 – 2015-01-31 (×7): 5000 [IU] via SUBCUTANEOUS
  Filled 2015-01-29 (×10): qty 1

## 2015-01-29 MED ORDER — MORPHINE SULFATE 2 MG/ML IJ SOLN
1.0000 mg | INTRAMUSCULAR | Status: DC | PRN
  Administered 2015-01-30 – 2015-01-31 (×7): 2 mg via INTRAVENOUS
  Filled 2015-01-29 (×9): qty 1

## 2015-01-29 MED ORDER — CLONAZEPAM 1 MG PO TABS
1.0000 mg | ORAL_TABLET | Freq: Two times a day (BID) | ORAL | Status: DC | PRN
  Administered 2015-01-29 – 2015-01-30 (×5): 1 mg via ORAL
  Filled 2015-01-29 (×5): qty 1

## 2015-01-29 MED ORDER — AZTREONAM 1 G IJ SOLR
1.0000 g | Freq: Once | INTRAMUSCULAR | Status: AC
  Administered 2015-01-29: 1 g via INTRAVENOUS
  Filled 2015-01-29: qty 1

## 2015-01-29 MED ORDER — VANCOMYCIN HCL IN DEXTROSE 1-5 GM/200ML-% IV SOLN
1000.0000 mg | INTRAVENOUS | Status: DC
  Administered 2015-01-29: 1000 mg via INTRAVENOUS
  Filled 2015-01-29: qty 200

## 2015-01-29 MED ORDER — NICOTINE 21 MG/24HR TD PT24
21.0000 mg | MEDICATED_PATCH | Freq: Every day | TRANSDERMAL | Status: DC
  Administered 2015-01-29 – 2015-01-31 (×3): 21 mg via TRANSDERMAL
  Filled 2015-01-29 (×3): qty 1

## 2015-01-29 MED ORDER — ACETAMINOPHEN 325 MG PO TABS: 650.0000 mg | ORAL_TABLET | Freq: Four times a day (QID) | ORAL | Status: DC | PRN

## 2015-01-29 NOTE — Progress Notes (Signed)
ANTIBIOTIC CONSULT NOTE - INITIAL  Pharmacy Consult for Vancomycin, Aztreonam Indication: rule out sepsis  Allergies  Allergen Reactions  . Penicillins Anaphylaxis  . Ibuprofen Nausea Only  . Nsaids Nausea And Vomiting  . Erythromycin Rash    Patient Measurements: Height: 5\' 11"  (180.3 cm) Weight: 177 lb 0.5 oz (80.3 kg) IBW/kg (Calculated) : 75.3  Vital Signs: Temp: 100.2 F (37.9 C) (06/10 1923) Temp Source: Rectal (06/10 1923) BP: 79/50 mmHg (06/11 0100) Pulse Rate: 76 (06/11 0100) Intake/Output from previous day: 06/10 0701 - 06/11 0700 In: -  Out: 175 [Urine:175] Intake/Output from this shift: Total I/O In: -  Out: 175 [Urine:175]  Labs:  Recent Labs  01/28/15 1926  WBC 15.3*  HGB 15.2  PLT 283  CREATININE 5.63*   Estimated Creatinine Clearance: 25.7 mL/min (by C-G formula based on Cr of 3.95). No results for input(s): VANCOTROUGH, VANCOPEAK, VANCORANDOM, GENTTROUGH, GENTPEAK, GENTRANDOM, TOBRATROUGH, TOBRAPEAK, TOBRARND, AMIKACINPEAK, AMIKACINTROU, AMIKACIN in the last 72 hours.   Microbiology: No results found for this or any previous visit (from the past 720 hour(s)).  Medical History: Past Medical History  Diagnosis Date  . Anxiety   . Chronic back pain   . Seizures   . COPD (chronic obstructive pulmonary disease)   . Hypertension   . Kidney stones     Medications:  Anti-infectives    Start     Dose/Rate Route Frequency Ordered Stop   01/28/15 2030  vancomycin (VANCOCIN) 1,500 mg in sodium chloride 0.9 % 500 mL IVPB     1,500 mg 250 mL/hr over 120 Minutes Intravenous  Once 01/28/15 1957 01/28/15 2345   01/28/15 2000  levofloxacin (LEVAQUIN) IVPB 750 mg     750 mg 100 mL/hr over 90 Minutes Intravenous  Once 01/28/15 1957 01/28/15 2151     Assessment: 44 yoM admitted on 6/10 with Acute Renal Failure and suspected sepsis without known source.  CT abdomen without acute abnormality.  CXR without acute cardiopulmonary disease.  Vancomycin  1500mg  and Levaquin 750mg  given in ED.  Pharmacy is now consulted to dose Vancomycin and Aztreonam.  6/10 >> Levaquin x1 only 6/10 >> Vanc >> 6/11 >> Aztreonam >>   Today, 01/29/2015:  Tmax 100.2  WBC 15.3  Renal: SCr 5.63 on admission, improved to 3.95 with CrCl ~ 25 ml/min   Goal of Therapy:  Vancomycin trough level 15-20 mcg/ml Appropriate abx dosing, eradication of infection.   Plan:   Aztreonam 1g IV q8h  Vancomycin 1500mg  IV once, then 1g IV q24h.  Measure Vanc trough at steady state.  Follow up renal fxn, culture results, and clinical course.  Lynann Beaver PharmD, BCPS Pager [phone] 01/29/2015 1:26 AM

## 2015-01-29 NOTE — Progress Notes (Signed)
[facility] PROGRESS NOTE  Eddie Williams WUJ:[id number] DOB: 11-23-1970 DOA: 01/28/2015 PCP: [name], MD  Assessment/Plan: 1. Acute renal failure: pre renal / hypoperfusion from dehydration.   Creatinine Improved to 3.59 from 5.  Resume IV fluids. Holding lisinopril and HCTZ. Plan to call nephrology if creatnine doesn't improve.      2. Fever elevated wbc, hypotension, work up for sepsis in progress,.  Blood cultures, urine cultures, stool for c diff ordered and on broad spectrum antibiotics.   3. Presumed DM: hgba1c is pending.   Near syncope: probably from dehydration. . Check orthostatics.  Echo ordered.    Code Status: full code.  Family Communication: family atbedside Disposition Plan: pending.    Consultants:  none  Procedures:  none  Antibiotics:  Vancomycin 6/11  Aztreonam 6/11  HPI/Subjective: Reports some right sided flank pain.   Objective: Filed Vitals:   01/29/15 1600  BP: 90/57  Pulse: 93  Temp:   Resp: 35    Intake/Output Summary (Last 24 hours) at 01/29/15 1709 Last data filed at 01/29/15 0700  Gross per 24 hour  Intake    550 ml  Output    725 ml  Net   -175 ml   Filed Weights   01/29/15 0205  Weight: 80.3 kg (177 lb 0.5 oz)    Exam:   General:  Alert afebrile comfortable  Cardiovascular: s1s2  Respiratory: ctab  Abdomen: soft mild tenderness in the right flank  Musculoskeletal: no pedal edema.   Data Reviewed: Basic Metabolic Panel:  Recent Labs Lab 01/28/15 1926 01/29/15 0146 01/29/15 0354  NA 135  --  138  K 3.1*  --  3.0*  CL 96*  --  103  CO2 24  --  25  GLUCOSE 141*  --  81  BUN 26*  --  22*  CREATININE 5.63* 3.95* 3.59*  CALCIUM 9.1  --  7.9*  MG  --   --  1.6*  PHOS  --   --  3.6   Liver Function Tests:  Recent Labs Lab 01/28/15 1926 01/29/15 0354  AST 17 14*  ALT 11* 10*  ALKPHOS 85 64  BILITOT 0.5 0.7  PROT 8.6* 6.5  ALBUMIN 4.7 3.4*   No results for input(s):  LIPASE, AMYLASE in the last 168 hours. No results for input(s): AMMONIA in the last 168 hours. CBC:  Recent Labs Lab 01/28/15 1926 01/29/15 0146 01/29/15 0354  WBC 15.3* 11.5* 10.8*  NEUTROABS 8.9*  --   --   HGB 15.2 11.2* 11.7*  HCT 43.8 34.0* 35.6*  MCV 99.1 98.8 101.7*  PLT 283 190 208   Cardiac Enzymes:  Recent Labs Lab 01/28/15 1926 01/29/15 0146 01/29/15 0849 01/29/15 1457  CKTOTAL 203  --   --   --   TROPONINI <0.03 <0.03 <0.03 <0.03   BNP (last 3 results) No results for input(s): BNP in the last 8760 hours.  ProBNP (last 3 results) No results for input(s): PROBNP in the last 8760 hours.  CBG: No results for input(s): GLUCAP in the last 168 hours.  Recent Results (from the past 240 hour(s))  MRSA PCR Screening     Status: None   Collection Time: 01/29/15  2:12 AM  Result Value Ref Range Status   MRSA by PCR NEGATIVE NEGATIVE Final    Comment:        The GeneXpert MRSA Assay (FDA approved for NASAL specimens only), is one component of a comprehensive MRSA colonization surveillance program. It  is not intended to diagnose MRSA infection nor to guide or monitor treatment for MRSA infections.      Studies: Ct Abdomen Pelvis Wo Contrast  01/29/2015   CLINICAL DATA:  Acute onset of dizziness and headache. Near syncope. Initial encounter.  EXAM: CT ABDOMEN AND PELVIS WITHOUT CONTRAST  TECHNIQUE: Multidetector CT imaging of the abdomen and pelvis was performed following the standard protocol without IV contrast.  COMPARISON:  CT of the abdomen and pelvis performed 05/31/2014  FINDINGS: Minimal bibasilar atelectasis is noted.  The liver and spleen are unremarkable in appearance. The gallbladder is within normal limits. The pancreas and adrenal glands are unremarkable.  The kidneys are unremarkable in appearance. There is no evidence of hydronephrosis. No renal or ureteral stones are seen. No perinephric stranding is appreciated.  No free fluid is identified. The  small bowel is unremarkable in appearance. The stomach is within normal limits. No acute vascular abnormalities are seen. Mild calcification is noted along the distal abdominal aorta. Incidental note is made of a circumaortic left renal vein.  The patient is status post appendectomy. The colon is unremarkable in appearance.  The bladder is decompressed, with a Foley catheter in place. The prostate remains normal in size. No inguinal lymphadenopathy is seen.  No acute osseous abnormalities are identified.  IMPRESSION: 1. No acute abnormality seen within the abdomen or pelvis. 2. Mild calcification along the distal abdominal aorta. 3. Incidental note of a circumaortic left renal vein.   Electronically Signed   By: Roanna Raider M.D.   On: 01/29/2015 00:56   Dg Chest 2 View  01/28/2015   CLINICAL DATA:  Fever for 3 days.  Low blood pressure.  EXAM: CHEST  2 VIEW  COMPARISON:  05/27/2013  FINDINGS: Lateral view degraded by patient arm position. Mild right hemidiaphragm elevation. Midline trachea. Normal heart size and mediastinal contours. No pleural effusion or pneumothorax. Clear lungs.  IMPRESSION: No acute cardiopulmonary disease.   Electronically Signed   By: Jeronimo Greaves M.D.   On: 01/28/2015 20:36    Scheduled Meds: . aztreonam  1 g Intravenous Q8H  . buPROPion  300 mg Oral q morning - 10a  . gabapentin  300 mg Oral TID  . heparin  5,000 Units Subcutaneous 3 times per day  . loratadine  10 mg Oral q morning - 10a  . nicotine  21 mg Transdermal Daily  . potassium chloride  40 mEq Oral BID  . sertraline  100 mg Oral q morning - 10a  . sodium chloride  1,000 mL Intravenous Once  . sodium chloride  3 mL Intravenous Q12H  . vancomycin  1,000 mg Intravenous Q24H   Continuous Infusions: . sodium chloride 100 mL/hr at 01/29/15 1236    Active Problems:   Acute renal failure   ARF (acute renal failure)   Other chest pain   Diarrhea    Time spent: 30 minutes    Dawson Hollman  [facility] Pager [phone] If 7PM-7AM, please contact night-coverage at [email or web address], password [facility] 01/29/2015, 5:09 PM  LOS: 1 day

## 2015-01-29 NOTE — Progress Notes (Signed)
  Echocardiogram 2D Echocardiogram has been performed.  Leta Jungling M 01/29/2015, 12:08 PM

## 2015-01-30 LAB — BASIC METABOLIC PANEL
ANION GAP: 6 (ref 5–15)
BUN: 12 mg/dL (ref 6–20)
CO2: 25 mmol/L (ref 22–32)
Calcium: 8.1 mg/dL — ABNORMAL LOW (ref 8.9–10.3)
Chloride: 107 mmol/L (ref 101–111)
Creatinine, Ser: 1.11 mg/dL (ref 0.61–1.24)
Glucose, Bld: 103 mg/dL — ABNORMAL HIGH (ref 65–99)
Potassium: 4.2 mmol/L (ref 3.5–5.1)
Sodium: 138 mmol/L (ref 135–145)

## 2015-01-30 LAB — CBC
HCT: 31.4 % — ABNORMAL LOW (ref 39.0–52.0)
HEMOGLOBIN: 10.3 g/dL — AB (ref 13.0–17.0)
MCH: 33.1 pg (ref 26.0–34.0)
MCHC: 32.8 g/dL (ref 30.0–36.0)
MCV: 101 fL — ABNORMAL HIGH (ref 78.0–100.0)
Platelets: 164 10*3/uL (ref 150–400)
RBC: 3.11 MIL/uL — AB (ref 4.22–5.81)
RDW: 13 % (ref 11.5–15.5)
WBC: 7.3 10*3/uL (ref 4.0–10.5)

## 2015-01-30 LAB — MAGNESIUM: MAGNESIUM: 2 mg/dL (ref 1.7–2.4)

## 2015-01-30 LAB — PROCALCITONIN: Procalcitonin: <0.1 ng/mL

## 2015-01-30 LAB — URINE CULTURE
COLONY COUNT: NO GROWTH
Culture: NO GROWTH

## 2015-01-30 MED ORDER — VANCOMYCIN HCL IN DEXTROSE 750-5 MG/150ML-% IV SOLN
750.0000 mg | Freq: Three times a day (TID) | INTRAVENOUS | Status: DC
  Administered 2015-01-30 – 2015-01-31 (×3): 750 mg via INTRAVENOUS
  Filled 2015-01-30 (×4): qty 150

## 2015-01-30 MED ORDER — DEXTROSE 5 % IV SOLN
1.0000 g | Freq: Three times a day (TID) | INTRAVENOUS | Status: DC
  Administered 2015-01-30 – 2015-01-31 (×4): 1 g via INTRAVENOUS
  Filled 2015-01-30 (×6): qty 1

## 2015-01-30 NOTE — Progress Notes (Signed)
Eddie Williams 1231 TRANSFERRED TO 1343 IN [facility] WITH NT. NO CHANGE IN CONDITION. REPORT GIVEN TO CINDY RN.

## 2015-01-30 NOTE — Progress Notes (Signed)
[facility] PROGRESS NOTE  BORA BRONER [id number]:[id number] DOB: 1971-05-26 DOA: 01/28/2015 PCP: [name], MD  Assessment/Plan: 1. Acute renal failure: pre renal / hypoperfusion from dehydration.   Creatinine Improved to 1.1 from 5.  Resume IV fluids. Holding lisinopril and HCTZ.  Watch for 24 more hours and plan for d/c in am.      2. Fever elevated wbc, hypotension, work up for sepsis in progress,.  Blood cultures, urine cultures, stool for c diff ordered and on broad spectrum antibiotics.  So far everything is negative.   3. Presumed DM: hgba1c is pending.   Near syncope: probably from dehydration. . Check orthostatics.  Echo ordered showed good LVEF.    Hypomagnesemia: Replete as needed.   Code Status: full code.  Family Communication: none at bedside Disposition Plan: pending.    Consultants:  none  Procedures:  none  Antibiotics:  Vancomycin 6/11  Aztreonam 6/11  HPI/Subjective: Feeling better.   Objective: Filed Vitals:   01/30/15 1430  BP: 100/61  Pulse: 76  Temp: 98 F (36.7 C)  Resp: 16    Intake/Output Summary (Last 24 hours) at 01/30/15 1537 Last data filed at 01/30/15 1230  Gross per 24 hour  Intake   4186 ml  Output   4225 ml  Net    -39 ml   Filed Weights   01/29/15 0205 01/30/15 1140  Weight: 80.3 kg (177 lb 0.5 oz) 84.9 kg (187 lb 2.7 oz)    Exam:   General:  Alert afebrile comfortable  Cardiovascular: s1s2  Respiratory: ctab  Abdomen: soft mild tenderness in the right flank  Musculoskeletal: no pedal edema.   Data Reviewed: Basic Metabolic Panel:  Recent Labs Lab 01/28/15 1926 01/29/15 0146 01/29/15 0354 01/29/15 1457 01/30/15 0356  NA 135  --  138  --  138  K 3.1*  --  3.0*  --  4.2  CL 96*  --  103  --  107  CO2 24  --  25  --  25  GLUCOSE 141*  --  81  --  103*  BUN 26*  --  22*  --  12  CREATININE 5.63* 3.95* 3.59*  --  1.11  CALCIUM 9.1  --  7.9*  --  8.1*  MG  --   --  1.6* 1.5*  2.0  PHOS  --   --  3.6  --   --    Liver Function Tests:  Recent Labs Lab 01/28/15 1926 01/29/15 0354  AST 17 14*  ALT 11* 10*  ALKPHOS 85 64  BILITOT 0.5 0.7  PROT 8.6* 6.5  ALBUMIN 4.7 3.4*   No results for input(s): LIPASE, AMYLASE in the last 168 hours. No results for input(s): AMMONIA in the last 168 hours. CBC:  Recent Labs Lab 01/28/15 1926 01/29/15 0146 01/29/15 0354 01/30/15 0356  WBC 15.3* 11.5* 10.8* 7.3  NEUTROABS 8.9*  --   --   --   HGB 15.2 11.2* 11.7* 10.3*  HCT 43.8 34.0* 35.6* 31.4*  MCV 99.1 98.8 101.7* 101.0*  PLT 283 190 208 164   Cardiac Enzymes:  Recent Labs Lab 01/28/15 1926 01/29/15 0146 01/29/15 0849 01/29/15 1457  CKTOTAL 203  --   --   --   TROPONINI <0.03 <0.03 <0.03 <0.03   BNP (last 3 results) No results for input(s): BNP in the last 8760 hours.  ProBNP (last 3 results) No results for input(s): PROBNP in the last 8760 hours.  CBG: No results  for input(s): GLUCAP in the last 168 hours.  Recent Results (from the past 240 hour(s))  Blood culture (routine x 2)     Status: None (Preliminary result)   Collection Time: 01/28/15  8:00 PM  Result Value Ref Range Status   Specimen Description BLOOD LAC  Final   Special Requests BOTTLES DRAWN AEROBIC AND ANAEROBIC 5CC  Final   Culture   Final           BLOOD CULTURE RECEIVED NO GROWTH TO DATE CULTURE WILL BE HELD FOR 5 DAYS BEFORE ISSUING A FINAL NEGATIVE REPORT Performed at [facility]    Report Status PENDING  Incomplete  Blood culture (routine x 2)     Status: None (Preliminary result)   Collection Time: 01/28/15  8:00 PM  Result Value Ref Range Status   Specimen Description BLOOD RAC  Final   Special Requests BOTTLES DRAWN AEROBIC AND ANAEROBIC 5CC  Final   Culture   Final           BLOOD CULTURE RECEIVED NO GROWTH TO DATE CULTURE WILL BE HELD FOR 5 DAYS BEFORE ISSUING A FINAL NEGATIVE REPORT Performed at [facility]    Report Status PENDING   Incomplete  MRSA PCR Screening     Status: None   Collection Time: 01/29/15  2:12 AM  Result Value Ref Range Status   MRSA by PCR NEGATIVE NEGATIVE Final    Comment:        The GeneXpert MRSA Assay (FDA approved for NASAL specimens only), is one component of a comprehensive MRSA colonization surveillance program. It is not intended to diagnose MRSA infection nor to guide or monitor treatment for MRSA infections.   Urine culture     Status: None   Collection Time: 01/29/15  3:01 AM  Result Value Ref Range Status   Specimen Description URINE, CATHETERIZED  Final   Special Requests NONE  Final   Colony Count NO GROWTH Performed at [facility]   Final   Culture NO GROWTH Performed at [facility]   Final   Report Status 01/30/2015 FINAL  Final     Studies: Ct Abdomen Pelvis Wo Contrast  01/29/2015   CLINICAL DATA:  Acute onset of dizziness and headache. Near syncope. Initial encounter.  EXAM: CT ABDOMEN AND PELVIS WITHOUT CONTRAST  TECHNIQUE: Multidetector CT imaging of the abdomen and pelvis was performed following the standard protocol without IV contrast.  COMPARISON:  CT of the abdomen and pelvis performed 05/31/2014  FINDINGS: Minimal bibasilar atelectasis is noted.  The liver and spleen are unremarkable in appearance. The gallbladder is within normal limits. The pancreas and adrenal glands are unremarkable.  The kidneys are unremarkable in appearance. There is no evidence of hydronephrosis. No renal or ureteral stones are seen. No perinephric stranding is appreciated.  No free fluid is identified. The small bowel is unremarkable in appearance. The stomach is within normal limits. No acute vascular abnormalities are seen. Mild calcification is noted along the distal abdominal aorta. Incidental note is made of a circumaortic left renal vein.  The patient is status post appendectomy. The colon is unremarkable in appearance.  The bladder is decompressed, with a Foley  catheter in place. The prostate remains normal in size. No inguinal lymphadenopathy is seen.  No acute osseous abnormalities are identified.  IMPRESSION: 1. No acute abnormality seen within the abdomen or pelvis. 2. Mild calcification along the distal abdominal aorta. 3. Incidental note of a circumaortic left renal vein.  Electronically Signed   By: Roanna Raider M.D.   On: 01/29/2015 00:56   Dg Chest 2 View  01/28/2015   CLINICAL DATA:  Fever for 3 days.  Low blood pressure.  EXAM: CHEST  2 VIEW  COMPARISON:  05/27/2013  FINDINGS: Lateral view degraded by patient arm position. Mild right hemidiaphragm elevation. Midline trachea. Normal heart size and mediastinal contours. No pleural effusion or pneumothorax. Clear lungs.  IMPRESSION: No acute cardiopulmonary disease.   Electronically Signed   By: Jeronimo Greaves M.D.   On: 01/28/2015 20:36    Scheduled Meds: . aztreonam  1 g Intravenous Q8H  . buPROPion  300 mg Oral q morning - 10a  . gabapentin  300 mg Oral TID  . heparin  5,000 Units Subcutaneous 3 times per day  . loratadine  10 mg Oral q morning - 10a  . nicotine  21 mg Transdermal Daily  . sertraline  100 mg Oral q morning - 10a  . sodium chloride  3 mL Intravenous Q12H  . vancomycin  750 mg Intravenous Q8H   Continuous Infusions: . sodium chloride 1,000 mL (01/30/15 1050)    Active Problems:   Acute renal failure   ARF (acute renal failure)   Other chest pain   Diarrhea    Time spent: 25 minutes    Verdis Bassette  [facility] Pager [phone] If 7PM-7AM, please contact night-coverage at [email or web address], password [email or web address] 01/30/2015, 3:37 PM  LOS: 2 days

## 2015-01-30 NOTE — Plan of Care (Signed)
Problem: Phase II Progression Outcomes Goal: Discharge plan established Outcome: Progressing Pt states he is going home 6/13

## 2015-01-30 NOTE — Progress Notes (Signed)
Foley dc'd. Voiding without problem

## 2015-01-30 NOTE — Progress Notes (Signed)
ANTIBIOTIC CONSULT NOTE - FOLLOW UP  Pharmacy Consult for Vancomycin / Aztreonam Indication: Sepsis  Allergies  Allergen Reactions  . Penicillins Anaphylaxis  . Ibuprofen Nausea Only  . Nsaids Nausea And Vomiting  . Erythromycin Rash    Patient Measurements: Height:  (180.3 cm) Weight: 177 lb 0.5 oz (80.3 kg) IBW/kg (Calculated) : 75.3 Adjusted Body Weight:   Vital Signs: Temp: 98 F (36.7 C) (06/12 0400) Temp Source: Oral (06/12 0020) BP: 108/60 mmHg (06/12 1000) Pulse Rate: 73 (06/12 1057) Intake/Output from previous day: 06/11 0701 - 06/12 0700 In: 4576 [P.O.:920; I.V.:2300; IV Piggyback:1356] Out: 3075 [Urine:3075] Intake/Output from this shift: Total I/O In: 350 [I.V.:300; IV Piggyback:50] Out: -   Labs:  Recent Labs  01/29/15 0146 01/29/15 0301 01/29/15 0354 01/30/15 0356  WBC 11.5*  --  10.8* 7.3  HGB 11.2*  --  11.7* 10.3*  PLT 190  --  208 164  LABCREA  --  259.3  --   --   CREATININE 3.95*  --  3.59* 1.11   Estimated Creatinine Clearance: 91.4 mL/min (by C-G formula based on Cr of 1.11). No results for input(s): VANCOTROUGH, VANCOPEAK, VANCORANDOM, GENTTROUGH, GENTPEAK, GENTRANDOM, TOBRATROUGH, TOBRAPEAK, TOBRARND, AMIKACINPEAK, AMIKACINTROU, AMIKACIN in the last 72 hours.   Microbiology: Recent Results (from the past 720 hour(s))  Blood culture (routine x 2)     Status: None (Preliminary result)   Collection Time: 01/28/15  8:00 PM  Result Value Ref Range Status   Specimen Description BLOOD LAC  Final   Special Requests BOTTLES DRAWN AEROBIC AND ANAEROBIC 5CC  Final   Culture   Final           BLOOD CULTURE RECEIVED NO GROWTH TO DATE CULTURE WILL BE HELD FOR 5 DAYS BEFORE ISSUING A FINAL NEGATIVE REPORT Performed at [facility]    Report Status PENDING  Incomplete  Blood culture (routine x 2)     Status: None (Preliminary result)   Collection Time: 01/28/15  8:00 PM  Result Value Ref Range Status   Specimen Description BLOOD  RAC  Final   Special Requests BOTTLES DRAWN AEROBIC AND ANAEROBIC 5CC  Final   Culture   Final           BLOOD CULTURE RECEIVED NO GROWTH TO DATE CULTURE WILL BE HELD FOR 5 DAYS BEFORE ISSUING A FINAL NEGATIVE REPORT Performed at [facility]    Report Status PENDING  Incomplete  MRSA PCR Screening     Status: None   Collection Time: 01/29/15  2:12 AM  Result Value Ref Range Status   MRSA by PCR NEGATIVE NEGATIVE Final    Comment:        The GeneXpert MRSA Assay (FDA approved for NASAL specimens only), is one component of a comprehensive MRSA colonization surveillance program. It is not intended to diagnose MRSA infection nor to guide or monitor treatment for MRSA infections.     Anti-infectives    Start     Dose/Rate Route Frequency Ordered Stop   01/30/15 2200  vancomycin (VANCOCIN) IVPB 1000 mg/200 mL premix  Status:  Discontinued     1,000 mg 200 mL/hr over 60 Minutes Intravenous Every 24 hours 01/29/15 0306 01/29/15 0308   01/30/15 0800  aztreonam (AZACTAM) 1 g in dextrose 5 % 50 mL IVPB     1 g 100 mL/hr over 30 Minutes Intravenous Every 8 hours 01/30/15 0011     01/29/15 2200  vancomycin (VANCOCIN) IVPB 1000 mg/200 mL premix  1,000 mg 200 mL/hr over 60 Minutes Intravenous Every 24 hours 01/29/15 0308     01/29/15 1200  aztreonam (AZACTAM) 1 g in dextrose 5 % 50 mL IVPB  Status:  Discontinued     1 g 100 mL/hr over 30 Minutes Intravenous Every 8 hours 01/29/15 0306 01/30/15 0011   01/29/15 0145  aztreonam (AZACTAM) 1 g in dextrose 5 % 50 mL IVPB     1 g 100 mL/hr over 30 Minutes Intravenous  Once 01/29/15 0135 01/29/15 0332   01/28/15 2030  vancomycin (VANCOCIN) 1,500 mg in sodium chloride 0.9 % 500 mL IVPB     1,500 mg 250 mL/hr over 120 Minutes Intravenous  Once 01/28/15 1957 01/28/15 2345   01/28/15 2000  levofloxacin (LEVAQUIN) IVPB 750 mg     750 mg 100 mL/hr over 90 Minutes Intravenous  Once 01/28/15 1957 01/28/15 2151      Assessment: 44 yoM  admitted on 6/10 with ARF and suspected sepsis without known source. CT abdomen without acute abnormality. CXR without acute cardiopulmonary disease.  Pharmacy consulted to start vancomycin and aztreonam (note allergy to PCN).   6/12: Today renal function greatly improved therefore will adjust vancomycin dose.  Afebrile, WBC now WNL, SCr big improvement today, PCT and LA WNL  6/10 >> Levaquin x1 only 6/10 >> Vanc >> 6/11 >> Aztreonam >>    6/10 blood x2: NGTD 6/11 urine: IP  6/11 MRSA PCR: negative 6/10 Cdiff:  6/10 Stool:  / sputum:    Goal of Therapy:  Vancomycin trough level 15-20 mcg/ml  Eradication of infection  Plan:  Increase to vancomycin 750mg  IV q8h Continue aztreonam 1g IV q8h  Haynes Hoehn, PharmD, BCPS 01/30/2015, 11:07 AM  Pager: [phone]

## 2015-01-31 LAB — BASIC METABOLIC PANEL
Anion gap: 5 (ref 5–15)
BUN: 8 mg/dL (ref 6–20)
CALCIUM: 8.5 mg/dL — AB (ref 8.9–10.3)
CO2: 28 mmol/L (ref 22–32)
Chloride: 108 mmol/L (ref 101–111)
Creatinine, Ser: 0.88 mg/dL (ref 0.61–1.24)
GFR calc Af Amer: >60 mL/min (ref >60)
GLUCOSE: 94 mg/dL (ref 65–99)
Potassium: 5.1 mmol/L (ref 3.5–5.1)
SODIUM: 141 mmol/L (ref 135–145)

## 2015-01-31 LAB — HEMOGLOBIN A1C
HEMOGLOBIN A1C: 5.4 % (ref 4.8–5.6)
MEAN PLASMA GLUCOSE: 108 mg/dL

## 2015-01-31 NOTE — Plan of Care (Signed)
Problem: Discharge Progression Outcomes Goal: Outpatient F/U arrangements in place Outcome: Progressing Pt to schedule

## 2015-01-31 NOTE — Care Management Note (Signed)
Case Management Note  Patient Details  Name: Eddie Williams MRN: [id number] Date of Birth: 1970-12-28  Subjective/Objective:                 44 yo admitted with Acute renal failure   Action/Plan: From home with significant other  Expected Discharge Date:  01/31/15               Expected Discharge Plan:  Home/Self Care  In-House Referral:     Discharge planning Services  CM Consult  Post Acute Care Choice:    Choice offered to:     DME Arranged:    DME Agency:     HH Arranged:    HH Agency:     Status of Service:  In process, will continue to follow  Medicare Important Message Given:  Yes Date Medicare IM Given:  01/31/15 Medicare IM give by:  Bartholome Bill RN,BSN,NCM Date Additional Medicare IM Given:    Additional Medicare Important Message give by:     If discussed at Long Length of Stay Meetings, dates discussed:    Additional Comments:  Bartholome Bill, RN 01/31/2015, 11:07 AM

## 2015-01-31 NOTE — Progress Notes (Signed)
Pt removed IV without any gauze, he was ready to leave. Discharge instructions explained to pt, he is to f/u with his PCP in 1 week.Pt refused wheelchair, states he wants to walk out

## 2015-02-01 LAB — HEMOGLOBIN A1C
HEMOGLOBIN A1C: 6.1 % — AB (ref 4.8–5.6)
MEAN PLASMA GLUCOSE: 128 mg/dL

## 2015-02-04 LAB — CULTURE, BLOOD (ROUTINE X 2)
Culture: NO GROWTH
Culture: NO GROWTH

## 2015-02-11 NOTE — Discharge Summary (Addendum)
Physician Discharge Summary  Eddie Williams:[id number] DOB: 1971/05/16 DOA: 01/28/2015  PCP: [name], MD  Admit date: 01/28/2015 Discharge date: 01/31/2015  Time spent: 30 minutes  Recommendations for Outpatient Follow-up:  1. Follow up with PCP and follow hgba1c.   Discharge Diagnoses:  Active Problems:   Acute renal failure   ARF (acute renal failure)   Other chest pain   Diarrhea   Discharge Condition: improved  Diet recommendation: carb modified diet  Filed Weights   01/29/15 0205 01/30/15 1140  Weight: 80.3 kg (177 lb 0.5 oz) 84.9 kg (187 lb 2.7 oz)    History of present illness:  44 year old admitted for acute renal failure.   Hospital Course:  1. Acute renal failure: pre renal / hypoperfusion from dehydration. Creatinine Improved to 1.1 from 5.   Holding lisinopril and HCTZ on discharge, outpatient follow up with PCP.     2. Fever elevated wbc, hypotension, work up for sepsis in progress,.  Blood cultures, urine cultures, stool for c diff ordered and on broad spectrum antibiotics.  So far everything is negative.  D/C antibiotics and d/c home.   3. Presumed DM: hgba1c is pending. Outpatient follow up.   Near syncope: probably from dehydration. . Negative  orthostatics.  Echo ordered showed good LVEF.    Hypomagnesemia: Replete as needed.   Procedures:  echo  Consultations:  noe Discharge Exam: Filed Vitals:   01/31/15 0500  BP: 106/70  Pulse: 62  Temp: 97.7 F (36.5 C)  Resp: 20    General: alert afebrile comfortable Cardiovascular: s1s2 Respiratory: ctab  Discharge Instructions   Discharge Instructions    Diet - low sodium heart healthy    Complete by:  As directed      Discharge instructions    Complete by:  As directed   Follow u pwith PCP in one week and follow up with the results of the HGBA1C FOR diabetes mellitus.          Discharge Medication List as of 01/31/2015 12:06 PM    CONTINUE these medications  which have NOT CHANGED   Details  acetaminophen (TYLENOL) 500 MG tablet Take 500 mg by mouth every 6 (six) hours as needed for moderate pain., Until Discontinued, Historical Med    albuterol (PROVENTIL HFA;VENTOLIN HFA) 108 (90 BASE) MCG/ACT inhaler Inhale 2 puffs into the lungs every 6 (six) hours as needed for wheezing or shortness of breath., Until Discontinued, Historical Med    buPROPion (WELLBUTRIN XL) 300 MG 24 hr tablet Take 300 mg by mouth every morning. , Until Discontinued, Historical Med    clonazePAM (KLONOPIN) 1 MG tablet Take 1 mg by mouth 2 (two) times daily as needed for anxiety. , Until Discontinued, Historical Med    fluticasone (FLONASE) 50 MCG/ACT nasal spray Place 1-2 sprays into both nostrils daily as needed for allergies or rhinitis. , Until Discontinued, Historical Med    gabapentin (NEURONTIN) 300 MG capsule Take 300 mg by mouth 3 (three) times daily., Until Discontinued, Historical Med    HYDROcodone-acetaminophen (NORCO) 10-325 MG per tablet Take 1 tablet by mouth every 6 (six) hours as needed for moderate pain. , Starting 01/27/2015, Until Discontinued, Historical Med    loratadine (CLARITIN) 10 MG tablet Take 10 mg by mouth every morning. , Until Discontinued, Historical Med    omeprazole (PRILOSEC) 20 MG capsule Take 20 mg by mouth daily., Until Discontinued, Historical Med    sertraline (ZOLOFT) 100 MG tablet Take 100 mg by mouth  every morning. , Until Discontinued, Historical Med      STOP taking these medications     atenolol-chlorthalidone (TENORETIC) 100-25 MG per tablet      diphenhydrAMINE (SOMINEX) 25 MG tablet      lisinopril-hydrochlorothiazide (PRINZIDE,ZESTORETIC) 10-12.5 MG per tablet        Allergies  Allergen Reactions  . Penicillins Anaphylaxis  . Ibuprofen Nausea Only  . Nsaids Nausea And Vomiting  . Erythromycin Rash      The results of significant diagnostics from this hospitalization (including imaging, microbiology,  ancillary and laboratory) are listed below for reference.    Significant Diagnostic Studies: Ct Abdomen Pelvis Wo Contrast  01/29/2015   CLINICAL DATA:  Acute onset of dizziness and headache. Near syncope. Initial encounter.  EXAM: CT ABDOMEN AND PELVIS WITHOUT CONTRAST  TECHNIQUE: Multidetector CT imaging of the abdomen and pelvis was performed following the standard protocol without IV contrast.  COMPARISON:  CT of the abdomen and pelvis performed 05/31/2014  FINDINGS: Minimal bibasilar atelectasis is noted.  The liver and spleen are unremarkable in appearance. The gallbladder is within normal limits. The pancreas and adrenal glands are unremarkable.  The kidneys are unremarkable in appearance. There is no evidence of hydronephrosis. No renal or ureteral stones are seen. No perinephric stranding is appreciated.  No free fluid is identified. The small bowel is unremarkable in appearance. The stomach is within normal limits. No acute vascular abnormalities are seen. Mild calcification is noted along the distal abdominal aorta. Incidental note is made of a circumaortic left renal vein.  The patient is status post appendectomy. The colon is unremarkable in appearance.  The bladder is decompressed, with a Foley catheter in place. The prostate remains normal in size. No inguinal lymphadenopathy is seen.  No acute osseous abnormalities are identified.  IMPRESSION: 1. No acute abnormality seen within the abdomen or pelvis. 2. Mild calcification along the distal abdominal aorta. 3. Incidental note of a circumaortic left renal vein.   Electronically Signed   By: Roanna Raider M.D.   On: 01/29/2015 00:56   Dg Chest 2 View  01/28/2015   CLINICAL DATA:  Fever for 3 days.  Low blood pressure.  EXAM: CHEST  2 VIEW  COMPARISON:  05/27/2013  FINDINGS: Lateral view degraded by patient arm position. Mild right hemidiaphragm elevation. Midline trachea. Normal heart size and mediastinal contours. No pleural effusion or  pneumothorax. Clear lungs.  IMPRESSION: No acute cardiopulmonary disease.   Electronically Signed   By: Jeronimo Greaves M.D.   On: 01/28/2015 20:36    Microbiology: No results found for this or any previous visit (from the past 240 hour(s)).   Labs: Basic Metabolic Panel: No results for input(s): NA, K, CL, CO2, GLUCOSE, BUN, CREATININE, CALCIUM, MG, PHOS in the last 168 hours. Liver Function Tests: No results for input(s): AST, ALT, ALKPHOS, BILITOT, PROT, ALBUMIN in the last 168 hours. No results for input(s): LIPASE, AMYLASE in the last 168 hours. No results for input(s): AMMONIA in the last 168 hours. CBC: No results for input(s): WBC, NEUTROABS, HGB, HCT, MCV, PLT in the last 168 hours. Cardiac Enzymes: No results for input(s): CKTOTAL, CKMB, CKMBINDEX, TROPONINI in the last 168 hours. BNP: BNP (last 3 results) No results for input(s): BNP in the last 8760 hours.  ProBNP (last 3 results) No results for input(s): PROBNP in the last 8760 hours.  CBG: No results for input(s): GLUCAP in the last 168 hours.     SignedKathlen Mody  [facility]  02/11/2015, 8:14 AM

## 2015-04-08 ENCOUNTER — Emergency Department (HOSPITAL_COMMUNITY): Payer: Medicare Other

## 2015-04-08 ENCOUNTER — Inpatient Hospital Stay (HOSPITAL_COMMUNITY)
Admission: EM | Admit: 2015-04-08 | Discharge: 2015-04-10 | DRG: 871 | Disposition: A | Discharge status: Home / Self Care | Payer: Medicare Other | Attending: Internal Medicine | Admitting: Internal Medicine

## 2015-04-08 ENCOUNTER — Encounter (HOSPITAL_COMMUNITY): Payer: Self-pay | Admitting: Emergency Medicine

## 2015-04-08 DIAGNOSIS — F17210 Nicotine dependence, cigarettes, uncomplicated: Secondary | ICD-10-CM | POA: Diagnosis present

## 2015-04-08 DIAGNOSIS — I10 Essential (primary) hypertension: Secondary | ICD-10-CM | POA: Diagnosis present

## 2015-04-08 DIAGNOSIS — R569 Unspecified convulsions: Secondary | ICD-10-CM | POA: Diagnosis present

## 2015-04-08 DIAGNOSIS — Z881 Allergy status to other antibiotic agents status: Secondary | ICD-10-CM | POA: Diagnosis not present

## 2015-04-08 DIAGNOSIS — J449 Chronic obstructive pulmonary disease, unspecified: Secondary | ICD-10-CM | POA: Diagnosis present

## 2015-04-08 DIAGNOSIS — F39 Unspecified mood [affective] disorder: Secondary | ICD-10-CM | POA: Diagnosis present

## 2015-04-08 DIAGNOSIS — N179 Acute kidney failure, unspecified: Secondary | ICD-10-CM | POA: Diagnosis present

## 2015-04-08 DIAGNOSIS — Z79891 Long term (current) use of opiate analgesic: Secondary | ICD-10-CM | POA: Diagnosis not present

## 2015-04-08 DIAGNOSIS — Y95 Nosocomial condition: Secondary | ICD-10-CM | POA: Diagnosis present

## 2015-04-08 DIAGNOSIS — Z888 Allergy status to other drugs, medicaments and biological substances status: Secondary | ICD-10-CM | POA: Diagnosis not present

## 2015-04-08 DIAGNOSIS — F411 Generalized anxiety disorder: Secondary | ICD-10-CM

## 2015-04-08 DIAGNOSIS — Z79899 Other long term (current) drug therapy: Secondary | ICD-10-CM | POA: Diagnosis not present

## 2015-04-08 DIAGNOSIS — A419 Sepsis, unspecified organism: Secondary | ICD-10-CM | POA: Diagnosis not present

## 2015-04-08 DIAGNOSIS — F419 Anxiety disorder, unspecified: Secondary | ICD-10-CM | POA: Diagnosis present

## 2015-04-08 DIAGNOSIS — Z880 Allergy status to penicillin: Secondary | ICD-10-CM | POA: Diagnosis not present

## 2015-04-08 DIAGNOSIS — M545 Low back pain, unspecified: Secondary | ICD-10-CM | POA: Diagnosis present

## 2015-04-08 DIAGNOSIS — J189 Pneumonia, unspecified organism: Secondary | ICD-10-CM | POA: Diagnosis present

## 2015-04-08 DIAGNOSIS — Z886 Allergy status to analgesic agent status: Secondary | ICD-10-CM | POA: Diagnosis not present

## 2015-04-08 DIAGNOSIS — Z87442 Personal history of urinary calculi: Secondary | ICD-10-CM

## 2015-04-08 DIAGNOSIS — N289 Disorder of kidney and ureter, unspecified: Secondary | ICD-10-CM | POA: Insufficient documentation

## 2015-04-08 DIAGNOSIS — B370 Candidal stomatitis: Secondary | ICD-10-CM | POA: Diagnosis present

## 2015-04-08 DIAGNOSIS — G8929 Other chronic pain: Secondary | ICD-10-CM | POA: Diagnosis present

## 2015-04-08 LAB — EXPECTORATED SPUTUM ASSESSMENT W GRAM STAIN, RFLX TO RESP C

## 2015-04-08 LAB — BASIC METABOLIC PANEL
Anion gap: 13 (ref 5–15)
BUN: 39 mg/dL — ABNORMAL HIGH (ref 6–20)
CALCIUM: 9.1 mg/dL (ref 8.9–10.3)
CO2: 25 mmol/L (ref 22–32)
CREATININE: 2.45 mg/dL — AB (ref 0.61–1.24)
Chloride: 99 mmol/L — ABNORMAL LOW (ref 101–111)
GFR calc Af Amer: 35 mL/min — ABNORMAL LOW (ref >60)
GFR, EST NON AFRICAN AMERICAN: 31 mL/min — AB (ref >60)
Glucose, Bld: 110 mg/dL — ABNORMAL HIGH (ref 65–99)
Potassium: 3.7 mmol/L (ref 3.5–5.1)
SODIUM: 137 mmol/L (ref 135–145)

## 2015-04-08 LAB — RAPID URINE DRUG SCREEN, HOSP PERFORMED
AMPHETAMINES: NOT DETECTED
BARBITURATES: NOT DETECTED
Benzodiazepines: POSITIVE — AB
Cocaine: NOT DETECTED
OPIATES: POSITIVE — AB
TETRAHYDROCANNABINOL: POSITIVE — AB

## 2015-04-08 LAB — CBC WITH DIFFERENTIAL/PLATELET
Basophils Absolute: 0 10*3/uL (ref 0.0–0.1)
Basophils Relative: 0 % (ref 0–1)
Eosinophils Absolute: 0.1 10*3/uL (ref 0.0–0.7)
Eosinophils Relative: 1 % (ref 0–5)
HCT: 35.4 % — ABNORMAL LOW (ref 39.0–52.0)
Hemoglobin: 11.9 g/dL — ABNORMAL LOW (ref 13.0–17.0)
LYMPHS ABS: 2.9 10*3/uL (ref 0.7–4.0)
Lymphocytes Relative: 18 % (ref 12–46)
MCH: 32.7 pg (ref 26.0–34.0)
MCHC: 33.6 g/dL (ref 30.0–36.0)
MCV: 97.3 fL (ref 78.0–100.0)
MONOS PCT: 5 % (ref 3–12)
Monocytes Absolute: 0.8 10*3/uL (ref 0.1–1.0)
Neutro Abs: 12.5 10*3/uL — ABNORMAL HIGH (ref 1.7–7.7)
Neutrophils Relative %: 76 % (ref 43–77)
Platelets: 190 10*3/uL (ref 150–400)
RBC: 3.64 MIL/uL — ABNORMAL LOW (ref 4.22–5.81)
RDW: 13 % (ref 11.5–15.5)
WBC: 16.4 10*3/uL — ABNORMAL HIGH (ref 4.0–10.5)

## 2015-04-08 LAB — MAGNESIUM: Magnesium: 1.7 mg/dL (ref 1.7–2.4)

## 2015-04-08 LAB — URINALYSIS, ROUTINE W REFLEX MICROSCOPIC
Bilirubin Urine: NEGATIVE
Glucose, UA: NEGATIVE mg/dL
HGB URINE DIPSTICK: NEGATIVE
Ketones, ur: NEGATIVE mg/dL
Leukocytes, UA: NEGATIVE
Nitrite: NEGATIVE
Protein, ur: NEGATIVE mg/dL
SPECIFIC GRAVITY, URINE: 1.022 (ref 1.005–1.030)
UROBILINOGEN UA: 0.2 mg/dL (ref 0.0–1.0)
pH: 5 (ref 5.0–8.0)

## 2015-04-08 LAB — MRSA PCR SCREENING: MRSA by PCR: NEGATIVE

## 2015-04-08 LAB — I-STAT CG4 LACTIC ACID, ED: LACTIC ACID, VENOUS: 0.67 mmol/L (ref 0.5–2.0)

## 2015-04-08 LAB — EXPECTORATED SPUTUM ASSESSMENT W REFEX TO RESP CULTURE

## 2015-04-08 LAB — PROCALCITONIN: Procalcitonin: 0.4 ng/mL

## 2015-04-08 LAB — STREP PNEUMONIAE URINARY ANTIGEN: STREP PNEUMO URINARY ANTIGEN: NEGATIVE

## 2015-04-08 MED ORDER — GABAPENTIN 300 MG PO CAPS
300.0000 mg | ORAL_CAPSULE | Freq: Three times a day (TID) | ORAL | Status: DC
  Administered 2015-04-08 – 2015-04-10 (×7): 300 mg via ORAL
  Filled 2015-04-08 (×7): qty 1

## 2015-04-08 MED ORDER — NYSTATIN 100000 UNIT/ML MT SUSP
5.0000 mL | Freq: Four times a day (QID) | OROMUCOSAL | Status: DC
  Administered 2015-04-08 – 2015-04-10 (×8): 500000 [IU] via ORAL
  Filled 2015-04-08 (×8): qty 5

## 2015-04-08 MED ORDER — IPRATROPIUM-ALBUTEROL 0.5-2.5 (3) MG/3ML IN SOLN
3.0000 mL | Freq: Once | RESPIRATORY_TRACT | Status: AC
  Administered 2015-04-08: 3 mL via RESPIRATORY_TRACT
  Filled 2015-04-08: qty 3

## 2015-04-08 MED ORDER — DEXTROSE 5 % IV SOLN
2.0000 g | Freq: Once | INTRAVENOUS | Status: AC
  Administered 2015-04-08: 2 g via INTRAVENOUS
  Filled 2015-04-08: qty 2

## 2015-04-08 MED ORDER — VANCOMYCIN HCL IN DEXTROSE 750-5 MG/150ML-% IV SOLN
750.0000 mg | Freq: Two times a day (BID) | INTRAVENOUS | Status: DC
  Administered 2015-04-08 – 2015-04-09 (×2): 750 mg via INTRAVENOUS
  Filled 2015-04-08 (×3): qty 150

## 2015-04-08 MED ORDER — LORATADINE 10 MG PO TABS
10.0000 mg | ORAL_TABLET | Freq: Every morning | ORAL | Status: DC
Start: 2015-04-08 — End: 2015-04-10
  Administered 2015-04-08 – 2015-04-10 (×3): 10 mg via ORAL
  Filled 2015-04-08 (×3): qty 1

## 2015-04-08 MED ORDER — ATENOLOL-CHLORTHALIDONE 100-25 MG PO TABS
1.0000 | ORAL_TABLET | Freq: Every day | ORAL | Status: DC
Start: 2015-04-08 — End: 2015-04-08

## 2015-04-08 MED ORDER — SODIUM CHLORIDE 0.9 % IV SOLN
Freq: Once | INTRAVENOUS | Status: AC
  Administered 2015-04-08: 50 mL/h via INTRAVENOUS

## 2015-04-08 MED ORDER — SODIUM CHLORIDE 0.9 % IV BOLUS (SEPSIS)
1000.0000 mL | Freq: Once | INTRAVENOUS | Status: AC
  Administered 2015-04-08: 1000 mL via INTRAVENOUS

## 2015-04-08 MED ORDER — ALBUTEROL SULFATE HFA 108 (90 BASE) MCG/ACT IN AERS: 2.0000 | INHALATION_SPRAY | Freq: Four times a day (QID) | RESPIRATORY_TRACT | Status: DC | PRN

## 2015-04-08 MED ORDER — SODIUM CHLORIDE 0.9 % IV SOLN
INTRAVENOUS | Status: DC
  Administered 2015-04-08 – 2015-04-10 (×5): via INTRAVENOUS

## 2015-04-08 MED ORDER — ACETAMINOPHEN 325 MG PO TABS
650.0000 mg | ORAL_TABLET | Freq: Once | ORAL | Status: AC
  Administered 2015-04-08: 650 mg via ORAL
  Filled 2015-04-08: qty 2

## 2015-04-08 MED ORDER — PANTOPRAZOLE SODIUM 40 MG PO TBEC
40.0000 mg | DELAYED_RELEASE_TABLET | Freq: Every day | ORAL | Status: DC
  Administered 2015-04-08 – 2015-04-10 (×3): 40 mg via ORAL
  Filled 2015-04-08 (×3): qty 1

## 2015-04-08 MED ORDER — SERTRALINE HCL 100 MG PO TABS
100.0000 mg | ORAL_TABLET | Freq: Every morning | ORAL | Status: DC
  Administered 2015-04-08 – 2015-04-10 (×3): 100 mg via ORAL
  Filled 2015-04-08 (×3): qty 1

## 2015-04-08 MED ORDER — DEXTROSE 5 % IV SOLN
1.0000 g | Freq: Three times a day (TID) | INTRAVENOUS | Status: DC
  Administered 2015-04-08 – 2015-04-10 (×6): 1 g via INTRAVENOUS
  Filled 2015-04-08 (×8): qty 1

## 2015-04-08 MED ORDER — ALBUTEROL SULFATE (2.5 MG/3ML) 0.083% IN NEBU
2.5000 mg | INHALATION_SOLUTION | Freq: Four times a day (QID) | RESPIRATORY_TRACT | Status: DC | PRN
  Administered 2015-04-08 – 2015-04-09 (×3): 2.5 mg via RESPIRATORY_TRACT
  Filled 2015-04-08 (×3): qty 3

## 2015-04-08 MED ORDER — BUPROPION HCL ER (XL) 300 MG PO TB24
300.0000 mg | ORAL_TABLET | Freq: Every morning | ORAL | Status: DC
  Administered 2015-04-08 – 2015-04-10 (×3): 300 mg via ORAL
  Filled 2015-04-08 (×3): qty 1

## 2015-04-08 MED ORDER — ONDANSETRON HCL 4 MG/2ML IJ SOLN
4.0000 mg | Freq: Four times a day (QID) | INTRAMUSCULAR | Status: DC | PRN
  Administered 2015-04-08 – 2015-04-09 (×4): 4 mg via INTRAVENOUS
  Filled 2015-04-08 (×4): qty 2

## 2015-04-08 MED ORDER — NICOTINE 21 MG/24HR TD PT24
21.0000 mg | MEDICATED_PATCH | Freq: Every day | TRANSDERMAL | Status: DC
  Administered 2015-04-08 – 2015-04-10 (×3): 21 mg via TRANSDERMAL
  Filled 2015-04-08 (×3): qty 1

## 2015-04-08 MED ORDER — CHLORTHALIDONE 25 MG PO TABS
25.0000 mg | ORAL_TABLET | Freq: Every day | ORAL | Status: DC
  Administered 2015-04-08 – 2015-04-10 (×3): 25 mg via ORAL
  Filled 2015-04-08 (×3): qty 1

## 2015-04-08 MED ORDER — HEPARIN SODIUM (PORCINE) 5000 UNIT/ML IJ SOLN
5000.0000 [IU] | Freq: Three times a day (TID) | INTRAMUSCULAR | Status: DC
  Administered 2015-04-08 – 2015-04-10 (×7): 5000 [IU] via SUBCUTANEOUS
  Filled 2015-04-08 (×7): qty 1

## 2015-04-08 MED ORDER — CLONAZEPAM 0.5 MG PO TABS
0.5000 mg | ORAL_TABLET | Freq: Two times a day (BID) | ORAL | Status: DC | PRN
  Administered 2015-04-08 – 2015-04-10 (×5): 0.5 mg via ORAL
  Filled 2015-04-08 (×5): qty 1

## 2015-04-08 MED ORDER — VANCOMYCIN HCL IN DEXTROSE 1-5 GM/200ML-% IV SOLN
1000.0000 mg | Freq: Once | INTRAVENOUS | Status: AC
  Administered 2015-04-08: 1000 mg via INTRAVENOUS
  Filled 2015-04-08: qty 200

## 2015-04-08 MED ORDER — LEVOFLOXACIN IN D5W 750 MG/150ML IV SOLN
750.0000 mg | Freq: Every day | INTRAVENOUS | Status: DC
  Administered 2015-04-08: 750 mg via INTRAVENOUS
  Filled 2015-04-08: qty 150

## 2015-04-08 MED ORDER — ATENOLOL 50 MG PO TABS
100.0000 mg | ORAL_TABLET | Freq: Every day | ORAL | Status: DC
  Administered 2015-04-08 – 2015-04-10 (×2): 100 mg via ORAL
  Filled 2015-04-08: qty 4
  Filled 2015-04-08 (×2): qty 2

## 2015-04-08 MED ORDER — FLUTICASONE PROPIONATE 50 MCG/ACT NA SUSP
1.0000 | Freq: Every day | NASAL | Status: DC | PRN
  Filled 2015-04-08: qty 16

## 2015-04-08 MED ORDER — HYDROCODONE-ACETAMINOPHEN 10-325 MG PO TABS
1.0000 | ORAL_TABLET | Freq: Four times a day (QID) | ORAL | Status: DC | PRN
  Administered 2015-04-08 – 2015-04-09 (×3): 1 via ORAL
  Filled 2015-04-08 (×3): qty 1

## 2015-04-08 NOTE — Progress Notes (Signed)
ANTIBIOTIC CONSULT NOTE - INITIAL  Pharmacy Consult for Vancomycin Indication: HCAP  Allergies  Allergen Reactions  . Penicillins Anaphylaxis  . Ibuprofen Nausea Only  . Nsaids Nausea And Vomiting  . Erythromycin Rash    Patient Measurements: Height:  (180.3 cm) Weight: 169 lb 1.5 oz (76.7 kg) IBW/kg (Calculated) : 75.3 Adjusted Body Weight:   Vital Signs: Temp: 103.3 F (39.6 C) (08/19 0332) Temp Source: Oral (08/19 0332) BP: 111/57 mmHg (08/19 0500) Pulse Rate: 89 (08/19 0500) Intake/Output from previous day: 08/18 0701 - 08/19 0700 In: 1400 [I.V.:1050; IV Piggyback:350] Out: 300 [Urine:300] Intake/Output from this shift: Total I/O In: 1400 [I.V.:1050; IV Piggyback:350] Out: 300 [Urine:300]  Labs:  Recent Labs  04/08/15 0125  WBC 16.4*  HGB 11.9*  PLT 190  CREATININE 2.45*   Estimated Creatinine Clearance: 41.4 mL/min (by C-G formula based on Cr of 2.45). No results for input(s): VANCOTROUGH, VANCOPEAK, VANCORANDOM, GENTTROUGH, GENTPEAK, GENTRANDOM, TOBRATROUGH, TOBRAPEAK, TOBRARND, AMIKACINPEAK, AMIKACINTROU, AMIKACIN in the last 72 hours.   Microbiology: Recent Results (from the past 720 hour(s))  MRSA PCR Screening     Status: None   Collection Time: 04/08/15  3:50 AM  Result Value Ref Range Status   MRSA by PCR NEGATIVE NEGATIVE Final    Comment:        The GeneXpert MRSA Assay (FDA approved for NASAL specimens only), is one component of a comprehensive MRSA colonization surveillance program. It is not intended to diagnose MRSA infection nor to guide or monitor treatment for MRSA infections.     Medical History: Past Medical History  Diagnosis Date  . Anxiety   . Chronic back pain   . Seizures   . COPD (chronic obstructive pulmonary disease)   . Hypertension   . Kidney stones     Medications:  Anti-infectives    Start     Dose/Rate Route Frequency Ordered Stop   04/08/15 1800  vancomycin (VANCOCIN) IVPB 750 mg/150 ml premix      750 mg 150 mL/hr over 60 Minutes Intravenous Every 12 hours 04/08/15 0546     04/08/15 0415  levofloxacin (LEVAQUIN) IVPB 750 mg     750 mg 100 mL/hr over 90 Minutes Intravenous Daily 04/08/15 0409 04/13/15 0559   04/08/15 0245  aztreonam (AZACTAM) 2 g in dextrose 5 % 50 mL IVPB     2 g 100 mL/hr over 30 Minutes Intravenous  Once 04/08/15 0232 04/08/15 0344   04/08/15 0245  vancomycin (VANCOCIN) IVPB 1000 mg/200 mL premix     1,000 mg 200 mL/hr over 60 Minutes Intravenous  Once 04/08/15 0236 04/08/15 0428     Assessment: Patient with HCAP.  First dose of antibiotics already given in ED. Patient with reduced renal function.  Goal of Therapy:  Vancomycin trough level 15-20 mcg/ml  Plan:  Measure antibiotic drug levels at steady state Follow up culture results Vancomycin  iv q12hr  Aleene Davidson Crowford 04/08/2015,5:49 AM

## 2015-04-08 NOTE — Progress Notes (Signed)
[facility] PROGRESS NOTE  Eddie Williams BJY:[id number] DOB: 18-Aug-1971 DOA: 04/08/2015 PCP: [name], MD   Same Day Note: Admitted after midnight  Assessment/Plan: 1. HCAP (healthcare-associated pneumonia) Healthcare associated pneumonia. Patient presented with hypotension, tachycardia, fever as well as acute kidney injury Pt is continued on IV hydration. Had been started on vancomycin with levaquin. Discussed with pharmacy, plan to transition to aztreonam with vanc Clinically improving, although pt continues to be febrile this AM  2. Acute kidney injury. -Patient has mild acute kidney injury. -Cont IVF and monitor closely -Most likely prerenal etiology in the setting of sepsis  3. Chronic back pain. -Continuing home medication.  4. Mood disorder. -Continuing home medication. -Checking UDS. -Stable  5. Oral thrush. -Nystatin oral  Code Status: Full Family Communication: Pt in room (indicate person spoken with, relationship, and if by phone, the number) Disposition Plan: Pending   Consultants:    Procedures:    Antibiotics:  Vancomycin 8/19>>>  Levaquin 8/19>>>8/19  Aztreonam 8/19>>> (indicate start date, and stop date if known)  HPI/Subjective: States feeling much better today. Eager to ambulate  Objective: Filed Vitals:   04/08/15 0600 04/08/15 0700 04/08/15 0800 04/08/15 1019  BP: 93/47 99/78 107/54   Pulse: 86 90 88   Temp: 101.6 F (38.7 C)     TempSrc: Oral     Resp: Height:      Weight:      SpO2: 95% 87% 95% 95%    Intake/Output Summary (Last 24 hours) at 04/08/15 1311 Last data filed at 04/08/15 0500  Gross per 24 hour  Intake   1400 ml  Output    300 ml  Net   1100 ml   Filed Weights   04/08/15 0332  Weight: 76.7 kg (169 lb 1.5 oz)    Exam:   General:  Awake, in nad  Cardiovascular: regular, s1, s2  Respiratory: normal resp effort, no wheezing  Abdomen: soft, nondistended  Musculoskeletal:  perfused, no clubbing   Data Reviewed: Basic Metabolic Panel:  Recent Labs Lab 04/08/15 0125  NA 137  K 3.7  CL 99*  CO2 25  GLUCOSE 110*  BUN 39*  CREATININE 2.45*  CALCIUM 9.1  MG 1.7   Liver Function Tests: No results for input(s): AST, ALT, ALKPHOS, BILITOT, PROT, ALBUMIN in the last 168 hours. No results for input(s): LIPASE, AMYLASE in the last 168 hours. No results for input(s): AMMONIA in the last 168 hours. CBC:  Recent Labs Lab 04/08/15 0125  WBC 16.4*  NEUTROABS 12.5*  HGB 11.9*  HCT 35.4*  MCV 97.3  PLT 190   Cardiac Enzymes: No results for input(s): CKTOTAL, CKMB, CKMBINDEX, TROPONINI in the last 168 hours. BNP (last 3 results) No results for input(s): BNP in the last 8760 hours.  ProBNP (last 3 results) No results for input(s): PROBNP in the last 8760 hours.  CBG: No results for input(s): GLUCAP in the last 168 hours.  Recent Results (from the past 240 hour(s))  MRSA PCR Screening     Status: None   Collection Time: 04/08/15  3:50 AM  Result Value Ref Range Status   MRSA by PCR NEGATIVE NEGATIVE Final    Comment:        The GeneXpert MRSA Assay (FDA approved for NASAL specimens only), is one component of a comprehensive MRSA colonization surveillance program. It is not intended to diagnose MRSA infection nor to guide or monitor treatment for MRSA infections.  Studies: Dg Chest 2 View  04/08/2015   CLINICAL DATA:  Asthma and COPD.  EXAM: CHEST  2 VIEW  COMPARISON:  01/28/2012  FINDINGS: Indistinct bibasilar lung opacities, more confluent on the left. No edema, effusion, or pneumothorax. Normal heart size and mediastinal contours.  IMPRESSION: Atelectasis or pneumonia at the bases, greater on the left.   Electronically Signed   By: Marnee Spring M.D.   On: 04/08/2015 02:23    Scheduled Meds: . atenolol  100 mg Oral Daily  . aztreonam  1 g Intravenous Q8H  . buPROPion  300 mg Oral q morning - 10a  . chlorthalidone  25 mg Oral  Daily  . gabapentin  300 mg Oral TID  . heparin  5,000 Units Subcutaneous 3 times per day  . loratadine  10 mg Oral q morning - 10a  . nystatin  5 mL Oral QID  . pantoprazole  40 mg Oral Daily  . sertraline  100 mg Oral q morning - 10a  . vancomycin  750 mg Intravenous Q12H   Continuous Infusions: . sodium chloride 100 mL/hr at 04/08/15 0801    Principal Problem:   HCAP (healthcare-associated pneumonia) Active Problems:   BACK PAIN, LUMBAR   AKI (acute kidney injury)    CHIU, STEPHEN K  [facility] Pager [phone]. If 7PM-7AM, please contact night-coverage at [email or web address], password [facility] 04/08/2015, 1:11 PM  LOS: 0 days

## 2015-04-08 NOTE — H&P (Signed)
[facility] History and Physical  Patient: Eddie Williams  MRN: [id number]  DOB: 1971/02/04  DOS: the patient was seen and examined on 04/08/2015 PCP: [name], MD  Referring physician: Dr. Mora Bellman Chief Complaint: Shortness of breath  HPI: COLBURN ASPER is a 44 y.o. male with Past medical history of anxiety, mood disorder, COPD, chronic back pain. The patient presents with complaints of cough and shortness of breath. Patient mentions this is ongoing since last 3 days. Patient was recently placed on prednisone and has completed a course 4 days ago. Patient denies any chest pain but complains of fever and chills. He denies any leg swelling denies any nausea vomiting diarrhea constipation. He denies any burning urination. Denies any abdominal pain. He mentions he is compliant with all his medications.  The patient is coming from home.  At his baseline ambulates without any support And is independent for most of his ADL manages his medication on his own.  Review of Systems: as mentioned in the history of present illness.  A comprehensive review of the other systems is negative.  Past Medical History  Diagnosis Date  . Anxiety   . Chronic back pain   . Seizures   . COPD (chronic obstructive pulmonary disease)   . Hypertension   . Kidney stones    Past Surgical History  Procedure Laterality Date  . Back surgery    . Appendectomy    . Ankle surgery     Social History:  reports that he has been smoking Cigarettes.  He has been smoking about 0.50 packs per day. He does not have any smokeless tobacco history on file. He reports that he drinks alcohol. He reports that he uses illicit drugs ("Crack" cocaine and Marijuana).  Allergies  Allergen Reactions  . Penicillins Anaphylaxis  . Ibuprofen Nausea Only  . Nsaids Nausea And Vomiting  . Erythromycin Rash    Family History  Problem Relation Age of Onset  . CAD Other   . Cancer Other   . Diabetes Other   .  Diabetes type II Mother   . Depression Mother   . CAD Father     Prior to Admission medications   Medication Sig Start Date End Date Taking? Authorizing Provider  acetaminophen (TYLENOL) 500 MG tablet Take 500 mg by mouth every 6 (six) hours as needed for moderate pain.   Yes Historical Provider, MD  albuterol (PROVENTIL HFA;VENTOLIN HFA) 108 (90 BASE) MCG/ACT inhaler Inhale 2 puffs into the lungs every 6 (six) hours as needed for wheezing or shortness of breath.   Yes Historical Provider, MD  atenolol-chlorthalidone (TENORETIC) 100-25 MG per tablet Take 1 tablet by mouth daily. 04/04/15  Yes Historical Provider, MD  buPROPion (WELLBUTRIN XL) 300 MG 24 hr tablet Take 300 mg by mouth every morning.    Yes Historical Provider, MD  clonazePAM (KLONOPIN) 1 MG tablet Take 1 mg by mouth 2 (two) times daily as needed for anxiety.    Yes Historical Provider, MD  fluticasone (FLONASE) 50 MCG/ACT nasal spray Place 1-2 sprays into both nostrils daily as needed for allergies or rhinitis.    Yes Historical Provider, MD  gabapentin (NEURONTIN) 300 MG capsule Take 300 mg by mouth 3 (three) times daily.   Yes Historical Provider, MD  HYDROcodone-acetaminophen (NORCO) 10-325 MG per tablet Take 1 tablet by mouth every 6 (six) hours as needed for moderate pain.  01/27/15  Yes Historical Provider, MD  lisinopril-hydrochlorothiazide (PRINZIDE,ZESTORETIC) 10-12.5 MG per tablet Take 1 tablet  by mouth daily. 04/04/15  Yes Historical Provider, MD  loratadine (CLARITIN) 10 MG tablet Take 10 mg by mouth every morning.    Yes Historical Provider, MD  omeprazole (PRILOSEC) 20 MG capsule Take 20 mg by mouth daily.   Yes Historical Provider, MD  sertraline (ZOLOFT) 100 MG tablet Take 100 mg by mouth every morning.    Yes Historical Provider, MD    Physical Exam: Filed Vitals:   04/08/15 0131 04/08/15 0247 04/08/15 0332 04/08/15 0500  BP:  108/88 127/40 111/57  Pulse:  94  89  Temp:  101.6 F (38.7 C) 103.3 F (39.6 C)     TempSrc:  Rectal Oral   Resp:  20 19 16   Height:   5\' 11"  (1.803 m)   Weight:   76.7 kg (169 lb 1.5 oz)   SpO2: 97% 97% 96% 93%    General: Alert, Awake and Oriented to Time, Place and Person. Appear in mild distress Eyes: PERRL ENT: Oral Mucosa oral thrush moist. Neck: no JVD Cardiovascular: S1 and S2 Present, no Murmur, Peripheral Pulses Present Respiratory: Bilateral Air entry equal and Decreased,  Bilateral basal Crackles, no wheezes Abdomen: Bowel Sound present, Soft and non tenderness Skin: no Rash Extremities: no Pedal edema, no calf tenderness Neurologic: Grossly no focal neuro deficit.  Labs on Admission:  CBC:  Recent Labs Lab 04/08/15 0125  WBC 16.4*  NEUTROABS 12.5*  HGB 11.9*  HCT 35.4*  MCV 97.3  PLT 190    CMP     Component Value Date/Time   NA 137 04/08/2015 0125   K 3.7 04/08/2015 0125   CL 99* 04/08/2015 0125   CO2 25 04/08/2015 0125   GLUCOSE 110* 04/08/2015 0125   BUN 39* 04/08/2015 0125   CREATININE 2.45* 04/08/2015 0125   CALCIUM 9.1 04/08/2015 0125   PROT 6.5 01/29/2015 0354   ALBUMIN 3.4* 01/29/2015 0354   AST 14* 01/29/2015 0354   ALT 10* 01/29/2015 0354   ALKPHOS 64 01/29/2015 0354   BILITOT 0.7 01/29/2015 0354   GFRNONAA 31* 04/08/2015 0125   GFRAA 35* 04/08/2015 0125    No results for input(s): LIPASE, AMYLASE in the last 168 hours.  No results for input(s): CKTOTAL, CKMB, CKMBINDEX, TROPONINI in the last 168 hours. BNP (last 3 results) No results for input(s): BNP in the last 8760 hours.  ProBNP (last 3 results) No results for input(s): PROBNP in the last 8760 hours.   Radiological Exams on Admission: Dg Chest 2 View  04/08/2015   CLINICAL DATA:  Asthma and COPD.  EXAM: CHEST  2 VIEW  COMPARISON:  01/28/2012  FINDINGS: Indistinct bibasilar lung opacities, more confluent on the left. No edema, effusion, or pneumothorax. Normal heart size and mediastinal contours.  IMPRESSION: Atelectasis or pneumonia at the bases, greater  on the left.   Electronically Signed   By: Marnee Spring M.D.   On: 04/08/2015 02:23   Assessment/Plan Principal Problem:   HCAP (healthcare-associated pneumonia) Active Problems:   BACK PAIN, LUMBAR   AKI (acute kidney injury)   1. HCAP (healthcare-associated pneumonia) Healthcare associated pneumonia. Patient with insulin, is on Levaquin. Check pro-calcitonin. With hypotension tachycardia and elevated lactic acid as well as acute kidney injury the patient meets criteria for sepsis. She'll be given IV hydration. Monitor on telemetry in stepdown unit.  2. Acute kidney injury. Patient has mild acute kidney injury. I would give him IV hydration and recheck her BMP again in the noontime. Most likely prerenal etiology.  3. Chronic  back pain. Continuing home medication.  4. Mood disorder. Continuing home medication. Checking UDS.  5. Oral thrush. Nystatin oral  Advance goals of care discussion: Full code   DVT Prophylaxis: subcutaneous Heparin Nutrition: Regular diet  Disposition: Admitted as inpatient, step-down unit.  Author: Lynden Oxford, MD [facility] Pager: [phone] 04/08/2015  If 7PM-7AM, please contact night-coverage [email or web address] Password [email or web address]

## 2015-04-08 NOTE — ED Notes (Signed)
Pt arrived to the ED with a complaint of chest congestion.  Pt states he has had these symptoms for a week.  Pt states he has been using his rescue inhaler and using his nebulizer without relief.  Pt also is having difficulty urinating.  Pt states that he has a hard time beginning a stream and maintaining a stream.

## 2015-04-08 NOTE — ED Notes (Signed)
Bladder scan done with result of  >130ml Urine volume.

## 2015-04-08 NOTE — ED Notes (Signed)
Patient transported to X-ray 

## 2015-04-08 NOTE — ED Provider Notes (Signed)
CSN: [id number]     Arrival date & time 04/08/15  0047 History   First MD Initiated Contact with Patient 04/08/15 0101     Chief Complaint  Patient presents with  . URI     (Consider location/radiation/quality/duration/timing/severity/associated sxs/prior Treatment) HPI Comments: Fara Olden he's needed to use his inhaler that he's needed to use his inhaler every 2-3 hours without getting any relief.  He also reports that he has been unable to urinate freely for the past 36 hours.  He has not urinated in over 24 hours.  He has a history of acute renal failure June of this year did not follow-up with urology or nephrology.  He states he just does not feel well  Patient is a 44 y.o. male presenting with URI. The history is provided by the patient.  URI Presenting symptoms: congestion and fatigue   Presenting symptoms: no cough   Severity:  Moderate Onset quality:  Gradual Duration:  3 days Timing:  Constant Progression:  Worsening Chronicity:  Recurrent Relieved by:  Nothing Ineffective treatments:  Nebulizer treatments Associated symptoms: wheezing   Associated symptoms: no headaches   Risk factors: chronic kidney disease   Risk factors comment:  Asthma   Past Medical History  Diagnosis Date  . Anxiety   . Chronic back pain   . Seizures   . COPD (chronic obstructive pulmonary disease)   . Hypertension   . Kidney stones    Past Surgical History  Procedure Laterality Date  . Back surgery    . Appendectomy    . Ankle surgery     Family History  Problem Relation Age of Onset  . CAD Other   . Cancer Other   . Diabetes Other   . Diabetes type II Mother   . Depression Mother   . CAD Father    Social History  Substance Use Topics  . Smoking status: Current Every Day Smoker -- 0.50 packs/day    Types: Cigarettes  . Smokeless tobacco: None  . Alcohol Use: Yes     Comment: social     Review of Systems  Constitutional: Positive for appetite change and fatigue.   HENT: Positive for congestion.   Respiratory: Positive for wheezing. Negative for cough.   Cardiovascular: Positive for chest pain.  Gastrointestinal: Positive for abdominal pain.  Genitourinary: Positive for urgency, scrotal swelling and enuresis. Negative for dysuria, penile swelling and testicular pain.  Musculoskeletal: Positive for back pain.  Skin: Negative for rash.  Neurological: Negative for dizziness and headaches.      Allergies  Penicillins; Ibuprofen; Nsaids; and Erythromycin  Home Medications   Prior to Admission medications   Medication Sig Start Date End Date Taking? Authorizing Provider  acetaminophen (TYLENOL) 500 MG tablet Take 500 mg by mouth every 6 (six) hours as needed for moderate pain.   Yes Historical Provider, MD  albuterol (PROVENTIL HFA;VENTOLIN HFA) 108 (90 BASE) MCG/ACT inhaler Inhale 2 puffs into the lungs every 6 (six) hours as needed for wheezing or shortness of breath.   Yes Historical Provider, MD  atenolol-chlorthalidone (TENORETIC) 100-25 MG per tablet Take 1 tablet by mouth daily. 04/04/15  Yes Historical Provider, MD  buPROPion (WELLBUTRIN XL) 300 MG 24 hr tablet Take 300 mg by mouth every morning.    Yes Historical Provider, MD  clonazePAM (KLONOPIN) 1 MG tablet Take 1 mg by mouth 2 (two) times daily as needed for anxiety.    Yes Historical Provider, MD  fluticasone (FLONASE) 50 MCG/ACT nasal  spray Place 1-2 sprays into both nostrils daily as needed for allergies or rhinitis.    Yes Historical Provider, MD  gabapentin (NEURONTIN) 300 MG capsule Take 300 mg by mouth 3 (three) times daily.   Yes Historical Provider, MD  HYDROcodone-acetaminophen (NORCO) 10-325 MG per tablet Take 1 tablet by mouth every 6 (six) hours as needed for moderate pain.  01/27/15  Yes Historical Provider, MD  lisinopril-hydrochlorothiazide (PRINZIDE,ZESTORETIC) 10-12.5 MG per tablet Take 1 tablet by mouth daily. 04/04/15  Yes Historical Provider, MD  loratadine (CLARITIN) 10 MG  tablet Take 10 mg by mouth every morning.    Yes Historical Provider, MD  omeprazole (PRILOSEC) 20 MG capsule Take 20 mg by mouth daily.   Yes Historical Provider, MD  sertraline (ZOLOFT) 100 MG tablet Take 100 mg by mouth every morning.    Yes Historical Provider, MD   BP 110/62 mmHg  Pulse 84  Temp(Src) 99.5 F (37.5 C) (Oral)  Resp 18  Ht  (1.803 m)  Wt 170 lb 13.7 oz (77.5 kg)  BMI 23.84 kg/m2  SpO2 90% Physical Exam  Constitutional: He is oriented to person, place, and time. He appears well-developed and well-nourished. No distress.  HENT:  Head: Normocephalic and atraumatic.  Mouth/Throat: Oropharynx is clear and moist.  Eyes: Pupils are equal, round, and reactive to light.  Neck: Normal range of motion.  Cardiovascular: Normal rate and regular rhythm.   Pulmonary/Chest: Effort normal and breath sounds normal. He exhibits no tenderness.  Abdominal: Soft. He exhibits no distension. There is tenderness.  Musculoskeletal: He exhibits no edema or tenderness.  Neurological: He is alert and oriented to person, place, and time.  Skin: There is pallor.  Vitals reviewed.   ED Course  Procedures (including critical care time) Labs Review Labs Reviewed  URINALYSIS, ROUTINE W REFLEX MICROSCOPIC (NOT AT [facility]) - Abnormal; Notable for the following:    APPearance CLOUDY (*)    All other components within normal limits  CBC WITH DIFFERENTIAL/PLATELET - Abnormal; Notable for the following:    WBC 16.4 (*)    RBC 3.64 (*)    Hemoglobin 11.9 (*)    HCT 35.4 (*)    Neutro Abs 12.5 (*)    All other components within normal limits  BASIC METABOLIC PANEL - Abnormal; Notable for the following:    Chloride 99 (*)    Glucose, Bld 110 (*)    BUN 39 (*)    Creatinine, Ser 2.45 (*)    GFR calc non Af Amer 31 (*)    GFR calc Af Amer 35 (*)    All other components within normal limits  URINE RAPID DRUG SCREEN, HOSP PERFORMED - Abnormal; Notable for the following:    Opiates POSITIVE  (*)    Benzodiazepines POSITIVE (*)    Tetrahydrocannabinol POSITIVE (*)    All other components within normal limits  CBC - Abnormal; Notable for the following:    WBC 12.2 (*)    RBC 2.99 (*)    Hemoglobin 9.4 (*)    HCT 29.6 (*)    Platelets 142 (*)    All other components within normal limits  BASIC METABOLIC PANEL - Abnormal; Notable for the following:    Sodium 133 (*)    Chloride 100 (*)    Glucose, Bld 110 (*)    BUN 27 (*)    Creatinine, Ser 1.35 (*)    Calcium 8.1 (*)    All other components within normal limits  CULTURE, BLOOD (  ROUTINE X 2)  CULTURE, BLOOD (ROUTINE X 2)  MRSA PCR SCREENING  CULTURE, EXPECTORATED SPUTUM-ASSESSMENT  CULTURE, RESPIRATORY (NON-EXPECTORATED)  MAGNESIUM  PROCALCITONIN  STREP PNEUMONIAE URINARY ANTIGEN  LEGIONELLA ANTIGEN, URINE  PROCALCITONIN  CBC  BASIC METABOLIC PANEL  I-STAT CG4 LACTIC ACID, ED    Imaging Review Dg Chest 2 View  04/08/2015   CLINICAL DATA:  Asthma and COPD.  EXAM: CHEST  2 VIEW  COMPARISON:  01/28/2012  FINDINGS: Indistinct bibasilar lung opacities, more confluent on the left. No edema, effusion, or pneumothorax. Normal heart size and mediastinal contours.  IMPRESSION: Atelectasis or pneumonia at the bases, greater on the left.   Electronically Signed   By: Marnee Spring M.D.   On: 04/08/2015 02:23   I have personally reviewed and evaluated these images and lab results as part of my medical decision-making.   EKG Interpretation None     Patient has a pneumonia on chest x-ray.  His last hospitalization was within the past 90 days.  You'll be considered.  Each And received appropriate antiemetics.  I spoke with DC from the pharmacy who will adjust his and a antibiotics due to his elevated creatinine level. MDM   Final diagnoses:  HCAP (healthcare-associated pneumonia)  Renal insufficiency         Earley Favor, NP 04/09/15 4098  Tomasita Crumble, MD 04/10/15 [phone]

## 2015-04-08 NOTE — Progress Notes (Signed)
ANTIBIOTIC CONSULT NOTE - INITIAL  Pharmacy Consult for Aztreonam, Vancomycin Indication: HCAP  Allergies  Allergen Reactions  . Penicillins Anaphylaxis  . Ibuprofen Nausea Only  . Nsaids Nausea And Vomiting  . Erythromycin Rash    Patient Measurements: Height:  (180.3 cm) Weight: 169 lb 1.5 oz (76.7 kg) IBW/kg (Calculated) : 75.3  Vital Signs: Temp: 101.6 F (38.7 C) (08/19 0600) Temp Source: Oral (08/19 0600) BP: 107/54 mmHg (08/19 0800) Pulse Rate: 88 (08/19 0800) Intake/Output from previous day: 08/18 0701 - 08/19 0700 In: 1400 [I.V.:1050; IV Piggyback:350] Out: 300 [Urine:300] Intake/Output from this shift:    Labs:  Recent Labs  04/08/15 0125  WBC 16.4*  HGB 11.9*  PLT 190  CREATININE 2.45*   Estimated Creatinine Clearance: 41.4 mL/min (by C-G formula based on Cr of 2.45). No results for input(s): VANCOTROUGH, VANCOPEAK, VANCORANDOM, GENTTROUGH, GENTPEAK, GENTRANDOM, TOBRATROUGH, TOBRAPEAK, TOBRARND, AMIKACINPEAK, AMIKACINTROU, AMIKACIN in the last 72 hours.   Microbiology: Recent Results (from the past 720 hour(s))  MRSA PCR Screening     Status: None   Collection Time: 04/08/15  3:50 AM  Result Value Ref Range Status   MRSA by PCR NEGATIVE NEGATIVE Final    Comment:        The GeneXpert MRSA Assay (FDA approved for NASAL specimens only), is one component of a comprehensive MRSA colonization surveillance program. It is not intended to diagnose MRSA infection nor to guide or monitor treatment for MRSA infections.     Medical History: Past Medical History  Diagnosis Date  . Anxiety   . Chronic back pain   . Seizures   . COPD (chronic obstructive pulmonary disease)   . Hypertension   . Kidney stones     Medications:  Scheduled:  . atenolol  100 mg Oral Daily  . buPROPion  300 mg Oral q morning - 10a  . chlorthalidone  25 mg Oral Daily  . gabapentin  300 mg Oral TID  . heparin  5,000 Units Subcutaneous 3 times per day  .  loratadine  10 mg Oral q morning - 10a  . nystatin  5 mL Oral QID  . pantoprazole  40 mg Oral Daily  . sertraline  100 mg Oral q morning - 10a  . vancomycin  750 mg Intravenous Q12H   Infusions:  . sodium chloride 100 mL/hr at 04/08/15 0801   Assessment: 44 yo male presented to ER with CC of SOB. PMH includes anxiety, mood disorder, COPD and chronic back pain. Per notes, patient was placed on prednisone course starting 4 days ago. To start vancomycin and aztreonam due to PCN allergy per pharmacy dosing for HCAP due to hospitalization in last 90 days  Goal of Therapy:  Vancomycin trough level 15-20 mcg/ml  Plan:  1) Continue vancomycin  IV q12 as already ordered 2) Aztreonam 1g IV q8 per current renal function   Hessie Knows, PharmD, BCPS Pager [phone] 04/08/2015 11:12 AM

## 2015-04-09 LAB — CBC
HEMATOCRIT: 29.6 % — AB (ref 39.0–52.0)
Hemoglobin: 9.4 g/dL — ABNORMAL LOW (ref 13.0–17.0)
MCH: 31.4 pg (ref 26.0–34.0)
MCHC: 31.8 g/dL (ref 30.0–36.0)
MCV: 99 fL (ref 78.0–100.0)
Platelets: 142 10*3/uL — ABNORMAL LOW (ref 150–400)
RBC: 2.99 MIL/uL — ABNORMAL LOW (ref 4.22–5.81)
RDW: 13.5 % (ref 11.5–15.5)
WBC: 12.2 10*3/uL — ABNORMAL HIGH (ref 4.0–10.5)

## 2015-04-09 LAB — BASIC METABOLIC PANEL
ANION GAP: 6 (ref 5–15)
BUN: 27 mg/dL — AB (ref 6–20)
CALCIUM: 8.1 mg/dL — AB (ref 8.9–10.3)
CO2: 27 mmol/L (ref 22–32)
Chloride: 100 mmol/L — ABNORMAL LOW (ref 101–111)
Creatinine, Ser: 1.35 mg/dL — ABNORMAL HIGH (ref 0.61–1.24)
GFR calc Af Amer: >60 mL/min (ref >60)
GFR calc non Af Amer: >60 mL/min (ref >60)
GLUCOSE: 110 mg/dL — AB (ref 65–99)
Potassium: 3.5 mmol/L (ref 3.5–5.1)
Sodium: 133 mmol/L — ABNORMAL LOW (ref 135–145)

## 2015-04-09 MED ORDER — ACETAMINOPHEN 325 MG PO TABS
650.0000 mg | ORAL_TABLET | Freq: Once | ORAL | Status: AC
  Administered 2015-04-09: 650 mg via ORAL
  Filled 2015-04-09: qty 2

## 2015-04-09 MED ORDER — VANCOMYCIN HCL IN DEXTROSE 1-5 GM/200ML-% IV SOLN
1000.0000 mg | Freq: Two times a day (BID) | INTRAVENOUS | Status: DC
  Administered 2015-04-09 – 2015-04-10 (×2): 1000 mg via INTRAVENOUS
  Filled 2015-04-09 (×3): qty 200

## 2015-04-09 MED ORDER — IPRATROPIUM-ALBUTEROL 0.5-2.5 (3) MG/3ML IN SOLN
3.0000 mL | Freq: Three times a day (TID) | RESPIRATORY_TRACT | Status: DC
  Administered 2015-04-10: 3 mL via RESPIRATORY_TRACT
  Filled 2015-04-09: qty 3

## 2015-04-09 MED ORDER — HYDROCODONE-ACETAMINOPHEN 10-325 MG PO TABS
1.0000 | ORAL_TABLET | ORAL | Status: DC | PRN
  Administered 2015-04-09 – 2015-04-10 (×6): 1 via ORAL
  Filled 2015-04-09 (×6): qty 1

## 2015-04-09 MED ORDER — ACETAMINOPHEN 325 MG PO TABS: 650.0000 mg | ORAL_TABLET | ORAL | Status: DC | PRN

## 2015-04-09 NOTE — Progress Notes (Signed)
RN informed by nursing secretary that pt's girlfriend called floor and stated there were pills (Klonopin and Norco) hidden in pt's room for pt to take. Charge RN removed pills from pt's bedside tray table. 2 green pills and 2 white pills were wasted in the sharps contained with Daine Gravel, charge RN, and this RN.  Norwood Levo, Joe, called and entered pt's room with this RN asking if there were more pills in room, pt stated there were and handed them to this RN. This RN inventoried and delivered to pharmacy for storage until pt is discharged. Will continue to monitor pt. Mardene Celeste I

## 2015-04-09 NOTE — Progress Notes (Signed)
Patient's BP in the 90s, Atenolol due to be given, notified Dr Rhona Leavens and he stated to hold atenolol today. will continue to assess patient.

## 2015-04-09 NOTE — Progress Notes (Signed)
ANTIBIOTIC CONSULT NOTE - follow up  Pharmacy Consult for Aztreonam, Vancomycin Indication: HCAP  Allergies  Allergen Reactions  . Penicillins Anaphylaxis  . Ibuprofen Nausea Only  . Nsaids Nausea And Vomiting  . Erythromycin Rash    Patient Measurements: Height:  (180.3 cm) Weight: 170 lb 13.7 oz (77.5 kg) IBW/kg (Calculated) : 75.3  Vital Signs: Temp: 98.2 F (36.8 C) (08/20 0858) Temp Source: Oral (08/20 0858) BP: 95/54 mmHg (08/20 0858) Pulse Rate: 81 (08/20 0858) Intake/Output from previous day: 08/19 0701 - 08/20 0700 In: 2948.3 [P.O.:200; I.V.:2298.3; IV Piggyback:450] Out: 500 [Urine:500] Intake/Output from this shift:    Labs:  Recent Labs  04/08/15 0125 04/09/15 0523  WBC 16.4* 12.2*  HGB 11.9* 9.4*  PLT 190 142*  CREATININE 2.45* 1.35*   Estimated Creatinine Clearance: 75.1 mL/min (by C-G formula based on Cr of 1.35). No results for input(s): VANCOTROUGH, VANCOPEAK, VANCORANDOM, GENTTROUGH, GENTPEAK, GENTRANDOM, TOBRATROUGH, TOBRAPEAK, TOBRARND, AMIKACINPEAK, AMIKACINTROU, AMIKACIN in the last 72 hours.   Microbiology: Recent Results (from the past 720 hour(s))  Blood culture (routine x 2)     Status: None (Preliminary result)   Collection Time: 04/08/15  3:16 AM  Result Value Ref Range Status   Specimen Description BLOOD LEFT WRIST  Final   Special Requests BOTTLES DRAWN AEROBIC AND ANAEROBIC 3CC  Final   Culture  Setup Time   Final    GRAM POSITIVE COCCI IN CLUSTERS AEROBIC BOTTLE ONLY CRITICAL RESULT CALLED TO, READ BACK BY AND VERIFIED WITH: Judith Part AT 0816 ON [id number] BY Lucienne Capers    Culture   Final    GRAM POSITIVE COCCI Performed at [facility]    Report Status PENDING  Incomplete  MRSA PCR Screening     Status: None   Collection Time: 04/08/15  3:50 AM  Result Value Ref Range Status   MRSA by PCR NEGATIVE NEGATIVE Final    Comment:        The GeneXpert MRSA Assay (FDA approved for NASAL specimens only), is  one component of a comprehensive MRSA colonization surveillance program. It is not intended to diagnose MRSA infection nor to guide or monitor treatment for MRSA infections.   Culture, sputum-assessment     Status: None   Collection Time: 04/08/15  9:43 PM  Result Value Ref Range Status   Specimen Description Expect. Sput  Final   Special Requests NONE  Final   Sputum evaluation   Final    THIS SPECIMEN IS ACCEPTABLE. RESPIRATORY CULTURE REPORT TO FOLLOW.   Report Status 04/08/2015 FINAL  Final    Medical History: Past Medical History  Diagnosis Date  . Anxiety   . Chronic back pain   . Seizures   . COPD (chronic obstructive pulmonary disease)   . Hypertension   . Kidney stones     Medications:  Scheduled:  . atenolol  100 mg Oral Daily  . aztreonam  1 g Intravenous Q8H  . buPROPion  300 mg Oral q morning - 10a  . chlorthalidone  25 mg Oral Daily  . gabapentin  300 mg Oral TID  . heparin  5,000 Units Subcutaneous 3 times per day  . loratadine  10 mg Oral q morning - 10a  . nicotine  21 mg Transdermal Daily  . nystatin  5 mL Oral QID  . pantoprazole  40 mg Oral Daily  . sertraline  100 mg Oral q morning - 10a  . vancomycin  750 mg Intravenous Q12H   Infusions:  .  sodium chloride 100 mL/hr at 04/09/15 0501   Assessment: 44 yo male presented to ER with CC of SOB. PMH includes anxiety, mood disorder, COPD and chronic back pain. Per notes, patient was placed on prednisone course starting 4 days ago. To start vancomycin and aztreonam on 8/19 due to PCN allergy per pharmacy dosing for HCAP due to hospitalization in last 90 days   Improved SCr 8/20  Tmax 101.5  Improved WBC  Goal of Therapy:  Vancomycin trough level 15-20 mcg/ml  Plan:  1) Due to improved renal function, change vancomycin from 750mg  q12 to 1g q12 2) Continue Aztreonam 1g IV q8 per current renal function   Hessie Knows, PharmD, BCPS Pager [phone] 04/09/2015 10:05 AM

## 2015-04-09 NOTE — Progress Notes (Signed)
Patient requesting prn BD. He states he uses his proair inhaler at least 3 times daily at home for SOB, and often uses it 4-5 times a day "on a bad day". RT treatment protocol assessment completed. Orders changed accordingly.

## 2015-04-09 NOTE — Progress Notes (Signed)
[facility] PROGRESS NOTE  Eddie Williams ZOX:[id number] DOB: 1971-07-13 DOA: 04/08/2015 PCP: [name], MD   Assessment/Plan: 1. HCAP (healthcare-associated pneumonia) CXR with findings suggestive of lower lobe PNA. Patient presented with hypotension, tachycardia, fever as well as acute kidney injury Leukocytosis improving however pt remains febrile Pt is continued on vancomycin and aztreonam  2. Acute kidney injury. -Patient has mild acute kidney injury. -Cont IVF and monitor closely -Most likely prerenal etiology in the setting of sepsis  3. Chronic back pain. -Continuing home medication.  4. Mood disorder. -Continuing home medication. -Checking UDS. -Stable  5. Oral thrush. -Nystatin oral  6. Possible gm pos bacteremia - thus far, pt with Gm pos in clusters on 1/2 blood cultures - Await finalization of culture results - Would repeat blood cultures after 24-48hrs from initial culture  Code Status: Full Family Communication: Pt in room (indicate person spoken with, relationship, and if by phone, the number) Disposition Plan: Pending   Consultants:    Procedures:    Antibiotics:  Vancomycin 8/19>>>  Levaquin 8/19>>>8/19  Aztreonam 8/19>>> (indicate start date, and stop date if known)  HPI/Subjective: Wants to go home. Denies sob  Objective: Filed Vitals:   04/09/15 0310 04/09/15 0520 04/09/15 0858 04/09/15 1400  BP:  111/59 95/54   Pulse:  88 81 82  Temp:  101.5 F (38.6 C) 98.2 F (36.8 C)   TempSrc:  Oral Oral   Resp:  Height:      Weight:  77.5 kg (170 lb 13.7 oz)    SpO2: 96% 96%      Intake/Output Summary (Last 24 hours) at 04/09/15 1450 Last data filed at 04/09/15 1345  Gross per 24 hour  Intake 3056.67 ml  Output    475 ml  Net 2581.67 ml   Filed Weights   04/08/15 0332 04/08/15 1411 04/09/15 0520  Weight: 76.7 kg (169 lb 1.5 oz) 78 kg (171 lb 15.3 oz) 77.5 kg (170 lb 13.7 oz)    Exam:   General:   Awake, laying in bed, in nad  Cardiovascular: regular, s1, s2  Respiratory: normal resp effort, no wheezing  Abdomen: soft, nondistended, pos BS  Musculoskeletal: perfused, no clubbing   Data Reviewed: Basic Metabolic Panel:  Recent Labs Lab 04/08/15 0125 04/09/15 0523  NA 137 133*  K 3.7 3.5  CL 99* 100*  CO2 25 27  GLUCOSE 110* 110*  BUN 39* 27*  CREATININE 2.45* 1.35*  CALCIUM 9.1 8.1*  MG 1.7  --    Liver Function Tests: No results for input(s): AST, ALT, ALKPHOS, BILITOT, PROT, ALBUMIN in the last 168 hours. No results for input(s): LIPASE, AMYLASE in the last 168 hours. No results for input(s): AMMONIA in the last 168 hours. CBC:  Recent Labs Lab 04/08/15 0125 04/09/15 0523  WBC 16.4* 12.2*  NEUTROABS 12.5*  --   HGB 11.9* 9.4*  HCT 35.4* 29.6*  MCV 97.3 99.0  PLT 190 142*   Cardiac Enzymes: No results for input(s): CKTOTAL, CKMB, CKMBINDEX, TROPONINI in the last 168 hours. BNP (last 3 results) No results for input(s): BNP in the last 8760 hours.  ProBNP (last 3 results) No results for input(s): PROBNP in the last 8760 hours.  CBG: No results for input(s): GLUCAP in the last 168 hours.  Recent Results (from the past 240 hour(s))  Blood culture (routine x 2)     Status: None (Preliminary result)   Collection Time: 04/08/15  3:16 AM  Result Value  Ref Range Status   Specimen Description BLOOD LEFT WRIST  Final   Special Requests BOTTLES DRAWN AEROBIC AND ANAEROBIC 3CC  Final   Culture  Setup Time   Final    GRAM POSITIVE COCCI IN CLUSTERS AEROBIC BOTTLE ONLY CRITICAL RESULT CALLED TO, READ BACK BY AND VERIFIED WITH: Judith Part AT 0816 ON [id number] BY Lucienne Capers    Culture   Final    GRAM POSITIVE COCCI Performed at [facility]    Report Status PENDING  Incomplete  MRSA PCR Screening     Status: None   Collection Time: 04/08/15  3:50 AM  Result Value Ref Range Status   MRSA by PCR NEGATIVE NEGATIVE Final    Comment:        The  GeneXpert MRSA Assay (FDA approved for NASAL specimens only), is one component of a comprehensive MRSA colonization surveillance program. It is not intended to diagnose MRSA infection nor to guide or monitor treatment for MRSA infections.   Culture, sputum-assessment     Status: None   Collection Time: 04/08/15  9:43 PM  Result Value Ref Range Status   Specimen Description Expect. Sput  Final   Special Requests NONE  Final   Sputum evaluation   Final    THIS SPECIMEN IS ACCEPTABLE. RESPIRATORY CULTURE REPORT TO FOLLOW.   Report Status 04/08/2015 FINAL  Final     Studies: Dg Chest 2 View  04/08/2015   CLINICAL DATA:  Asthma and COPD.  EXAM: CHEST  2 VIEW  COMPARISON:  01/28/2012  FINDINGS: Indistinct bibasilar lung opacities, more confluent on the left. No edema, effusion, or pneumothorax. Normal heart size and mediastinal contours.  IMPRESSION: Atelectasis or pneumonia at the bases, greater on the left.   Electronically Signed   By: Marnee Spring M.D.   On: 04/08/2015 02:23    Scheduled Meds: . atenolol  100 mg Oral Daily  . aztreonam  1 g Intravenous Q8H  . buPROPion  300 mg Oral q morning - 10a  . chlorthalidone  25 mg Oral Daily  . gabapentin  300 mg Oral TID  . heparin  5,000 Units Subcutaneous 3 times per day  . loratadine  10 mg Oral q morning - 10a  . nicotine  21 mg Transdermal Daily  . nystatin  5 mL Oral QID  . pantoprazole  40 mg Oral Daily  . sertraline  100 mg Oral q morning - 10a  . vancomycin  1,000 mg Intravenous Q12H   Continuous Infusions: . sodium chloride 100 mL/hr at 04/09/15 0501    Principal Problem:   HCAP (healthcare-associated pneumonia) Active Problems:   BACK PAIN, LUMBAR   AKI (acute kidney injury)   Renal insufficiency    Harbor Paster K  [facility] Pager [phone]. If 7PM-7AM, please contact night-coverage at [email or web address], password [facility] 04/09/2015, 2:50 PM  LOS: 1 day

## 2015-04-09 NOTE — Progress Notes (Signed)
CRITICAL VALUE ALERT  Critical value received: Positive  Blood culture  Date of notification:  04/09/15  Time of notification: 0816  Critical value read back:Yes.    Nurse who received alert:  Sumner Boast  MD notified (1st page):  Dr. Rhona Leavens  Time of first page:  0900  MD notified (2nd page):  Time of second page:  Responding MD:  Dr. Rhona Leavens  Time MD responded:  0900

## 2015-04-10 DIAGNOSIS — A419 Sepsis, unspecified organism: Principal | ICD-10-CM

## 2015-04-10 LAB — BASIC METABOLIC PANEL
ANION GAP: 6 (ref 5–15)
BUN: 8 mg/dL (ref 6–20)
CALCIUM: 8.3 mg/dL — AB (ref 8.9–10.3)
CO2: 30 mmol/L (ref 22–32)
Chloride: 101 mmol/L (ref 101–111)
Creatinine, Ser: 0.94 mg/dL (ref 0.61–1.24)
Glucose, Bld: 105 mg/dL — ABNORMAL HIGH (ref 65–99)
Potassium: 3.3 mmol/L — ABNORMAL LOW (ref 3.5–5.1)
Sodium: 137 mmol/L (ref 135–145)

## 2015-04-10 LAB — CBC
HEMATOCRIT: 28.3 % — AB (ref 39.0–52.0)
Hemoglobin: 9.2 g/dL — ABNORMAL LOW (ref 13.0–17.0)
MCH: 31.8 pg (ref 26.0–34.0)
MCHC: 32.5 g/dL (ref 30.0–36.0)
MCV: 97.9 fL (ref 78.0–100.0)
PLATELETS: 135 10*3/uL — AB (ref 150–400)
RBC: 2.89 MIL/uL — ABNORMAL LOW (ref 4.22–5.81)
RDW: 13.3 % (ref 11.5–15.5)
WBC: 6.3 10*3/uL (ref 4.0–10.5)

## 2015-04-10 LAB — PROCALCITONIN: PROCALCITONIN: 0.62 ng/mL

## 2015-04-10 MED ORDER — LEVOFLOXACIN 750 MG PO TABS: 750.0000 mg | ORAL_TABLET | Freq: Every day | ORAL | Status: DC

## 2015-04-10 MED ORDER — POTASSIUM CHLORIDE CRYS ER 20 MEQ PO TBCR
40.0000 meq | EXTENDED_RELEASE_TABLET | Freq: Once | ORAL | Status: AC
  Administered 2015-04-10: 40 meq via ORAL
  Filled 2015-04-10: qty 2

## 2015-04-10 NOTE — Discharge Summary (Signed)
Physician Discharge Summary  ZAIDIN BLYDEN GNF:[id number] DOB: [birth year] DOA: 04/08/2015  PCP: [name], MD  Admit date: 04/08/2015 Discharge date: 04/11/2015  Time spent: 20 minutes  Recommendations for Outpatient Follow-up:  Follow up with PCP in 1-2 weeks  Discharge Diagnoses:  Principal Problem:   Sepsis due to pneumonia Active Problems:   BACK PAIN, LUMBAR   HCAP (healthcare-associated pneumonia)   AKI (acute kidney injury)   Renal insufficiency   Discharge Condition: Improved  Diet recommendation: Regular  Filed Weights   04/08/15 1411 04/09/15 0520 04/10/15 0513  Weight: 78 kg (171 lb 15.3 oz) 77.5 kg (170 lb 13.7 oz) 76 kg (167 lb 8.8 oz)    History of present illness:  Please review h and p from 8/19 for details. Briefly, ptp resented with sob, found to be septic with pneumonia and acute renal failure. The patient was admitted for further workup.  Hospital Course:  1. HCAP (healthcare-associated pneumonia) CXR with findings suggestive of lower lobe PNA. Patient presented with hypotension, tachycardia, fever as well as acute kidney injury Leukocytosis improved with fevers eventually resolved Pt was continued on vancomycin and aztreonam, to be transitioned to levaquin on discharge  2. Acute kidney injury. -Patient has mild acute kidney injury. -Cont IVF and monitor closely -Most likely prerenal etiology in the setting of sepsis  3. Chronic back pain. -Continued home medication.  4. Mood disorder. -Continued home medication. -UDS pos for benzos, opiates, and marijuana -Stable  5. Oral thrush. -Nystatin oral  6. 1/4 Gram pos blood culture, not true bacteremia - Coag neg staph noted in 1/4 blood cultures - Very likely contaminant and NOT bacteremia  Discharge Exam: Filed Vitals:   04/10/15 0453 04/10/15 0513 04/10/15 1004 04/10/15 1027  BP: 103/60   118/60  Pulse: 64   92  Temp: 98.7 F (37.1 C)   99.1 F (37.3 C)  TempSrc: Oral      Resp: 17     Height:      Weight:  76 kg (167 lb 8.8 oz)    SpO2: 100%  97%     General: awake, in nad Cardiovascular: regular, s1, s2 Respiratory: normal resp effort, no wheezing  Discharge Instructions     Medication List    STOP taking these medications        lisinopril-hydrochlorothiazide 10-12.5 MG per tablet  Commonly known as:  PRINZIDE,ZESTORETIC      TAKE these medications        acetaminophen 500 MG tablet  Commonly known as:  TYLENOL  Take 500 mg by mouth every 6 (six) hours as needed for moderate pain.     albuterol 108 (90 BASE) MCG/ACT inhaler  Commonly known as:  PROVENTIL HFA;VENTOLIN HFA  Inhale 2 puffs into the lungs every 6 (six) hours as needed for wheezing or shortness of breath.     atenolol-chlorthalidone 100-25 MG per tablet  Commonly known as:  TENORETIC  Take 1 tablet by mouth daily.     buPROPion 300 MG 24 hr tablet  Commonly known as:  WELLBUTRIN XL  Take 300 mg by mouth every morning.     clonazePAM 1 MG tablet  Commonly known as:  KLONOPIN  Take 1 mg by mouth 2 (two) times daily as needed for anxiety.     fluticasone 50 MCG/ACT nasal spray  Commonly known as:  FLONASE  Place 1-2 sprays into both nostrils daily as needed for allergies or rhinitis.     gabapentin 300 MG capsule  Commonly  known as:  NEURONTIN  Take 300 mg by mouth 3 (three) times daily.     HYDROcodone-acetaminophen 10-325 MG per tablet  Commonly known as:  NORCO  Take 1 tablet by mouth every 6 (six) hours as needed for moderate pain.     levofloxacin 750 MG tablet  Commonly known as:  LEVAQUIN  Take 1 tablet (750 mg total) by mouth daily.     loratadine 10 MG tablet  Commonly known as:  CLARITIN  Take 10 mg by mouth every morning.     omeprazole 20 MG capsule  Commonly known as:  PRILOSEC  Take 20 mg by mouth daily.     sertraline 100 MG tablet  Commonly known as:  ZOLOFT  Take 100 mg by mouth every morning.       Allergies  Allergen Reactions   . Penicillins Anaphylaxis  . Ibuprofen Nausea Only  . Nsaids Nausea And Vomiting  . Erythromycin Rash   Follow-up Information    Follow up with AVBUERE,EDWIN A, MD. Schedule an appointment as soon as possible for a visit in 1 week.   Specialty:  Internal Medicine   Why:  Hospital follow up   Contact information:   [address] [place] [address] [phone]        The results of significant diagnostics from this hospitalization (including imaging, microbiology, ancillary and laboratory) are listed below for reference.    Significant Diagnostic Studies: Dg Chest 2 View  04/08/2015   CLINICAL DATA:  Asthma and COPD.  EXAM: CHEST  2 VIEW  COMPARISON:  01/28/2012  FINDINGS: Indistinct bibasilar lung opacities, more confluent on the left. No edema, effusion, or pneumothorax. Normal heart size and mediastinal contours.  IMPRESSION: Atelectasis or pneumonia at the bases, greater on the left.   Electronically Signed   By: Marnee Spring M.D.   On: 04/08/2015 02:23    Microbiology: Recent Results (from the past 240 hour(s))  Blood culture (routine x 2)     Status: None (Preliminary result)   Collection Time: 04/08/15  3:16 AM  Result Value Ref Range Status   Specimen Description BLOOD LEFT HAND  Final   Special Requests BOTTLES DRAWN AEROBIC AND ANAEROBIC 3CC  Final   Culture   Final    NO GROWTH 3 DAYS Performed at [facility]    Report Status PENDING  Incomplete  Blood culture (routine x 2)     Status: None   Collection Time: 04/08/15  3:16 AM  Result Value Ref Range Status   Specimen Description BLOOD LEFT WRIST  Final   Special Requests BOTTLES DRAWN AEROBIC AND ANAEROBIC 3CC  Final   Culture  Setup Time   Final    GRAM POSITIVE COCCI IN CLUSTERS AEROBIC BOTTLE ONLY CRITICAL RESULT CALLED TO, READ BACK BY AND VERIFIED WITH: IGabriel Rainwater AT 0816 ON [id number] BY Lucienne Capers    Culture   Final    STAPHYLOCOCCUS SPECIES (COAGULASE NEGATIVE) THE  SIGNIFICANCE OF ISOLATING THIS ORGANISM FROM A SINGLE SET OF BLOOD CULTURES WHEN MULTIPLE SETS ARE DRAWN IS UNCERTAIN. PLEASE NOTIFY THE MICROBIOLOGY DEPARTMENT WITHIN ONE WEEK IF SPECIATION AND SENSITIVITIES ARE REQUIRED. Performed at [facility]    Report Status 04/11/2015 FINAL  Final  MRSA PCR Screening     Status: None   Collection Time: 04/08/15  3:50 AM  Result Value Ref Range Status   MRSA by PCR NEGATIVE NEGATIVE Final    Comment:        The  GeneXpert MRSA Assay (FDA approved for NASAL specimens only), is one component of a comprehensive MRSA colonization surveillance program. It is not intended to diagnose MRSA infection nor to guide or monitor treatment for MRSA infections.   Culture, sputum-assessment     Status: None   Collection Time: 04/08/15  9:43 PM  Result Value Ref Range Status   Specimen Description Expect. Sput  Final   Special Requests NONE  Final   Sputum evaluation   Final    THIS SPECIMEN IS ACCEPTABLE. RESPIRATORY CULTURE REPORT TO FOLLOW.   Report Status 04/08/2015 FINAL  Final  Culture, respiratory (NON-Expectorated)     Status: None   Collection Time: 04/08/15  9:43 PM  Result Value Ref Range Status   Specimen Description SPUTUM  Final   Special Requests NONE  Final   Gram Stain   Final    ABUNDANT WBC PRESENT, PREDOMINANTLY PMN FEW SQUAMOUS EPITHELIAL CELLS PRESENT MODERATE GRAM POSITIVE RODS FEW YEAST FEW GRAM NEGATIVE RODS    Culture   Final    NORMAL OROPHARYNGEAL FLORA Performed at [facility]    Report Status 04/11/2015 FINAL  Final  Culture, blood (routine x 2)     Status: None (Preliminary result)   Collection Time: 04/10/15  9:22 AM  Result Value Ref Range Status   Specimen Description BLOOD LEFT ARM  Final   Special Requests BOTTLES DRAWN AEROBIC AND ANAEROBIC 10CC  Final   Culture   Final    NO GROWTH < 24 HOURS Performed at [facility]    Report Status PENDING  Incomplete  Culture, blood (routine x  2)     Status: None (Preliminary result)   Collection Time: 04/10/15  9:32 AM  Result Value Ref Range Status   Specimen Description BLOOD RIGHT ARM  Final   Special Requests BOTTLES DRAWN AEROBIC ONLY 7CC  Final   Culture   Final    NO GROWTH < 24 HOURS Performed at [facility]    Report Status PENDING  Incomplete     Labs: Basic Metabolic Panel:  Recent Labs Lab 04/08/15 0125 04/09/15 0523 04/10/15 0525  NA 137 133* 137  K 3.7 3.5 3.3*  CL 99* 100* 101  CO2 25 27 30   GLUCOSE 110* 110* 105*  BUN 39* 27* 8  CREATININE 2.45* 1.35* 0.94  CALCIUM 9.1 8.1* 8.3*  MG 1.7  --   --    Liver Function Tests: No results for input(s): AST, ALT, ALKPHOS, BILITOT, PROT, ALBUMIN in the last 168 hours. No results for input(s): LIPASE, AMYLASE in the last 168 hours. No results for input(s): AMMONIA in the last 168 hours. CBC:  Recent Labs Lab 04/08/15 0125 04/09/15 0523 04/10/15 0525  WBC 16.4* 12.2* 6.3  NEUTROABS 12.5*  --   --   HGB 11.9* 9.4* 9.2*  HCT 35.4* 29.6* 28.3*  MCV 97.3 99.0 97.9  PLT 190 142* 135*   Cardiac Enzymes: No results for input(s): CKTOTAL, CKMB, CKMBINDEX, TROPONINI in the last 168 hours. BNP: BNP (last 3 results) No results for input(s): BNP in the last 8760 hours.  ProBNP (last 3 results) No results for input(s): PROBNP in the last 8760 hours.  CBG: No results for input(s): GLUCAP in the last 168 hours.  Signed:  CHIU, Scheryl Marten  [facility] 04/11/2015, 6:15 PM

## 2015-04-10 NOTE — Progress Notes (Signed)
Pt discharged to home with belongings, IVs and tele removed. Pt given AVS and teach back performed. Pt received home meds from pharmacy (klonopin and norco). Pt refused to take wheelchair downstairs, pt walked to lobby to meet his ride. Patient stable at time of discharge.

## 2015-04-11 DIAGNOSIS — A419 Sepsis, unspecified organism: Secondary | ICD-10-CM

## 2015-04-11 DIAGNOSIS — J189 Pneumonia, unspecified organism: Secondary | ICD-10-CM

## 2015-04-11 LAB — CULTURE, RESPIRATORY W GRAM STAIN

## 2015-04-11 LAB — CULTURE, BLOOD (ROUTINE X 2)

## 2015-04-11 LAB — CULTURE, RESPIRATORY: CULTURE: NORMAL

## 2015-04-11 LAB — LEGIONELLA ANTIGEN, URINE

## 2015-04-13 LAB — CULTURE, BLOOD (ROUTINE X 2): Culture: NO GROWTH

## 2015-04-15 LAB — CULTURE, BLOOD (ROUTINE X 2)
CULTURE: NO GROWTH
Culture: NO GROWTH

## 2015-04-24 ENCOUNTER — Emergency Department (HOSPITAL_COMMUNITY): Payer: Medicare Other

## 2015-04-24 ENCOUNTER — Emergency Department (HOSPITAL_COMMUNITY)
Admission: EM | Admit: 2015-04-24 | Discharge: 2015-04-24 | Disposition: A | Discharge status: Home / Self Care | Payer: Medicare Other | Attending: Emergency Medicine | Admitting: Emergency Medicine

## 2015-04-24 DIAGNOSIS — Y929 Unspecified place or not applicable: Secondary | ICD-10-CM | POA: Diagnosis not present

## 2015-04-24 DIAGNOSIS — Z87442 Personal history of urinary calculi: Secondary | ICD-10-CM | POA: Diagnosis not present

## 2015-04-24 DIAGNOSIS — Y939 Activity, unspecified: Secondary | ICD-10-CM | POA: Diagnosis not present

## 2015-04-24 DIAGNOSIS — F1010 Alcohol abuse, uncomplicated: Secondary | ICD-10-CM

## 2015-04-24 DIAGNOSIS — I10 Essential (primary) hypertension: Secondary | ICD-10-CM | POA: Insufficient documentation

## 2015-04-24 DIAGNOSIS — J449 Chronic obstructive pulmonary disease, unspecified: Secondary | ICD-10-CM | POA: Insufficient documentation

## 2015-04-24 DIAGNOSIS — Y999 Unspecified external cause status: Secondary | ICD-10-CM | POA: Insufficient documentation

## 2015-04-24 DIAGNOSIS — Z880 Allergy status to penicillin: Secondary | ICD-10-CM | POA: Diagnosis not present

## 2015-04-24 DIAGNOSIS — W1839XA Other fall on same level, initial encounter: Secondary | ICD-10-CM | POA: Diagnosis not present

## 2015-04-24 DIAGNOSIS — F419 Anxiety disorder, unspecified: Secondary | ICD-10-CM | POA: Insufficient documentation

## 2015-04-24 DIAGNOSIS — Z79899 Other long term (current) drug therapy: Secondary | ICD-10-CM | POA: Diagnosis not present

## 2015-04-24 DIAGNOSIS — Z720 Tobacco use: Secondary | ICD-10-CM | POA: Insufficient documentation

## 2015-04-24 DIAGNOSIS — F10129 Alcohol abuse with intoxication, unspecified: Secondary | ICD-10-CM | POA: Diagnosis present

## 2015-04-24 DIAGNOSIS — G8929 Other chronic pain: Secondary | ICD-10-CM | POA: Insufficient documentation

## 2015-04-24 DIAGNOSIS — G40909 Epilepsy, unspecified, not intractable, without status epilepticus: Secondary | ICD-10-CM | POA: Diagnosis not present

## 2015-04-24 DIAGNOSIS — S0081XA Abrasion of other part of head, initial encounter: Secondary | ICD-10-CM | POA: Diagnosis not present

## 2015-04-24 LAB — CBC WITH DIFFERENTIAL/PLATELET
BASOS ABS: 0.1 10*3/uL (ref 0.0–0.1)
BASOS PCT: 1 % (ref 0–1)
Eosinophils Absolute: 0.4 10*3/uL (ref 0.0–0.7)
Eosinophils Relative: 5 % (ref 0–5)
HEMATOCRIT: 35.1 % — AB (ref 39.0–52.0)
Hemoglobin: 11.9 g/dL — ABNORMAL LOW (ref 13.0–17.0)
LYMPHS PCT: 41 % (ref 12–46)
Lymphs Abs: 3.1 10*3/uL (ref 0.7–4.0)
MCH: 33 pg (ref 26.0–34.0)
MCHC: 33.9 g/dL (ref 30.0–36.0)
MCV: 97.2 fL (ref 78.0–100.0)
MONO ABS: 0.4 10*3/uL (ref 0.1–1.0)
Monocytes Relative: 5 % (ref 3–12)
NEUTROS ABS: 3.6 10*3/uL (ref 1.7–7.7)
Neutrophils Relative %: 48 % (ref 43–77)
PLATELETS: 305 10*3/uL (ref 150–400)
RBC: 3.61 MIL/uL — AB (ref 4.22–5.81)
RDW: 13.6 % (ref 11.5–15.5)
WBC: 7.6 10*3/uL (ref 4.0–10.5)

## 2015-04-24 LAB — COMPREHENSIVE METABOLIC PANEL
ALBUMIN: 3.8 g/dL (ref 3.5–5.0)
ALT: 11 U/L — AB (ref 17–63)
AST: 20 U/L (ref 15–41)
Alkaline Phosphatase: 68 U/L (ref 38–126)
Anion gap: 10 (ref 5–15)
BILIRUBIN TOTAL: 0.6 mg/dL (ref 0.3–1.2)
BUN: 15 mg/dL (ref 6–20)
CHLORIDE: 104 mmol/L (ref 101–111)
CO2: 25 mmol/L (ref 22–32)
CREATININE: 1.11 mg/dL (ref 0.61–1.24)
Calcium: 9.3 mg/dL (ref 8.9–10.3)
GFR calc Af Amer: >60 mL/min (ref >60)
GFR calc non Af Amer: >60 mL/min (ref >60)
GLUCOSE: 69 mg/dL (ref 65–99)
POTASSIUM: 3.3 mmol/L — AB (ref 3.5–5.1)
Sodium: 139 mmol/L (ref 135–145)
Total Protein: 7.2 g/dL (ref 6.5–8.1)

## 2015-04-24 LAB — ETHANOL: Alcohol, Ethyl (B): 210 mg/dL — ABNORMAL HIGH (ref <5)

## 2015-04-24 MED ORDER — SODIUM CHLORIDE 0.9 % IV BOLUS (SEPSIS)
1000.0000 mL | Freq: Once | INTRAVENOUS | Status: AC
  Administered 2015-04-24: 1000 mL via INTRAVENOUS

## 2015-04-24 MED ORDER — ACETAMINOPHEN 325 MG PO TABS: 650.0000 mg | ORAL_TABLET | Freq: Once | ORAL | Status: DC

## 2015-04-24 NOTE — Discharge Instructions (Signed)
Stop drinking alcohol!! °
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
## 2015-04-24 NOTE — ED Notes (Signed)
Pt discharged to home via significant other.
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
## 2015-05-06 ENCOUNTER — Emergency Department (HOSPITAL_COMMUNITY)
Admission: EM | Admit: 2015-05-06 | Discharge: 2015-05-06 | Discharge status: Against Medical Advice | Payer: Medicare Other | Source: Home / Self Care | Attending: Emergency Medicine | Admitting: Emergency Medicine

## 2015-05-06 ENCOUNTER — Encounter (HOSPITAL_COMMUNITY): Payer: Self-pay

## 2015-05-06 ENCOUNTER — Emergency Department (HOSPITAL_COMMUNITY): Payer: Medicare Other

## 2015-05-06 DIAGNOSIS — I10 Essential (primary) hypertension: Secondary | ICD-10-CM

## 2015-05-06 DIAGNOSIS — Z880 Allergy status to penicillin: Secondary | ICD-10-CM

## 2015-05-06 DIAGNOSIS — A419 Sepsis, unspecified organism: Secondary | ICD-10-CM | POA: Diagnosis not present

## 2015-05-06 DIAGNOSIS — R0602 Shortness of breath: Secondary | ICD-10-CM | POA: Diagnosis not present

## 2015-05-06 DIAGNOSIS — Z79899 Other long term (current) drug therapy: Secondary | ICD-10-CM | POA: Insufficient documentation

## 2015-05-06 DIAGNOSIS — J45901 Unspecified asthma with (acute) exacerbation: Secondary | ICD-10-CM

## 2015-05-06 DIAGNOSIS — F419 Anxiety disorder, unspecified: Secondary | ICD-10-CM

## 2015-05-06 DIAGNOSIS — G40909 Epilepsy, unspecified, not intractable, without status epilepticus: Secondary | ICD-10-CM | POA: Insufficient documentation

## 2015-05-06 DIAGNOSIS — Z7951 Long term (current) use of inhaled steroids: Secondary | ICD-10-CM | POA: Insufficient documentation

## 2015-05-06 DIAGNOSIS — Z87442 Personal history of urinary calculi: Secondary | ICD-10-CM

## 2015-05-06 DIAGNOSIS — G8929 Other chronic pain: Secondary | ICD-10-CM

## 2015-05-06 DIAGNOSIS — Z720 Tobacco use: Secondary | ICD-10-CM | POA: Insufficient documentation

## 2015-05-06 DIAGNOSIS — J441 Chronic obstructive pulmonary disease with (acute) exacerbation: Secondary | ICD-10-CM

## 2015-05-06 DIAGNOSIS — N289 Disorder of kidney and ureter, unspecified: Secondary | ICD-10-CM | POA: Insufficient documentation

## 2015-05-06 HISTORY — DX: Unspecified asthma, uncomplicated: J45.909

## 2015-05-06 LAB — CBC WITH DIFFERENTIAL/PLATELET
BASOS PCT: 0 %
Basophils Absolute: 0 10*3/uL (ref 0.0–0.1)
EOS ABS: 0.1 10*3/uL (ref 0.0–0.7)
Eosinophils Relative: 0 %
HCT: 35.1 % — ABNORMAL LOW (ref 39.0–52.0)
HEMOGLOBIN: 11.8 g/dL — AB (ref 13.0–17.0)
Lymphocytes Relative: 7 %
Lymphs Abs: 1.2 10*3/uL (ref 0.7–4.0)
MCH: 33.3 pg (ref 26.0–34.0)
MCHC: 33.6 g/dL (ref 30.0–36.0)
MCV: 99.2 fL (ref 78.0–100.0)
MONOS PCT: 4 %
Monocytes Absolute: 0.7 10*3/uL (ref 0.1–1.0)
NEUTROS PCT: 89 %
Neutro Abs: 14.6 10*3/uL — ABNORMAL HIGH (ref 1.7–7.7)
PLATELETS: 210 10*3/uL (ref 150–400)
RBC: 3.54 MIL/uL — ABNORMAL LOW (ref 4.22–5.81)
RDW: 14.5 % (ref 11.5–15.5)
WBC: 16.6 10*3/uL — ABNORMAL HIGH (ref 4.0–10.5)

## 2015-05-06 LAB — BASIC METABOLIC PANEL
Anion gap: 9 (ref 5–15)
BUN: 31 mg/dL — ABNORMAL HIGH (ref 6–20)
CHLORIDE: 98 mmol/L — AB (ref 101–111)
CO2: 26 mmol/L (ref 22–32)
CREATININE: 1.77 mg/dL — AB (ref 0.61–1.24)
Calcium: 9.2 mg/dL (ref 8.9–10.3)
GFR calc non Af Amer: 45 mL/min — ABNORMAL LOW (ref >60)
GFR, EST AFRICAN AMERICAN: 53 mL/min — AB (ref >60)
Glucose, Bld: 97 mg/dL (ref 65–99)
POTASSIUM: 4.3 mmol/L (ref 3.5–5.1)
SODIUM: 133 mmol/L — AB (ref 135–145)

## 2015-05-06 LAB — TROPONIN I

## 2015-05-06 LAB — D-DIMER, QUANTITATIVE: D-Dimer, Quant: 0.46 ug/mL-FEU (ref 0.00–0.48)

## 2015-05-06 LAB — LACTIC ACID, PLASMA: LACTIC ACID, VENOUS: 1.4 mmol/L (ref 0.5–2.0)

## 2015-05-06 MED ORDER — SODIUM CHLORIDE 0.9 % IV BOLUS (SEPSIS)
1000.0000 mL | Freq: Once | INTRAVENOUS | Status: AC
  Administered 2015-05-06: 1000 mL via INTRAVENOUS

## 2015-05-06 MED ORDER — ACETAMINOPHEN 500 MG PO TABS
1000.0000 mg | ORAL_TABLET | Freq: Once | ORAL | Status: AC
  Administered 2015-05-06: 1000 mg via ORAL
  Filled 2015-05-06: qty 2

## 2015-05-06 MED ORDER — ALBUTEROL (5 MG/ML) CONTINUOUS INHALATION SOLN
10.0000 mg/h | INHALATION_SOLUTION | Freq: Once | RESPIRATORY_TRACT | Status: AC
  Administered 2015-05-06: 10 mg/h via RESPIRATORY_TRACT
  Filled 2015-05-06: qty 20

## 2015-05-06 MED ORDER — ALBUTEROL SULFATE (2.5 MG/3ML) 0.083% IN NEBU
2.5000 mg | INHALATION_SOLUTION | Freq: Once | RESPIRATORY_TRACT | Status: AC
  Administered 2015-05-06: 2.5 mg via RESPIRATORY_TRACT
  Filled 2015-05-06: qty 3

## 2015-05-06 MED ORDER — KETOROLAC TROMETHAMINE 60 MG/2ML IM SOLN
60.0000 mg | Freq: Once | INTRAMUSCULAR | Status: AC
  Administered 2015-05-06: 60 mg via INTRAMUSCULAR
  Filled 2015-05-06 (×2): qty 2

## 2015-05-06 MED ORDER — PREDNISONE 20 MG PO TABS
60.0000 mg | ORAL_TABLET | Freq: Once | ORAL | Status: AC
  Administered 2015-05-06: 60 mg via ORAL
  Filled 2015-05-06: qty 3

## 2015-05-06 MED ORDER — IPRATROPIUM-ALBUTEROL 0.5-2.5 (3) MG/3ML IN SOLN
3.0000 mL | Freq: Once | RESPIRATORY_TRACT | Status: AC
  Administered 2015-05-06: 3 mL via RESPIRATORY_TRACT
  Filled 2015-05-06: qty 3

## 2015-05-06 MED ORDER — IPRATROPIUM BROMIDE 0.02 % IN SOLN
1.0000 mg | Freq: Once | RESPIRATORY_TRACT | Status: AC
  Administered 2015-05-06: 1 mg via RESPIRATORY_TRACT
  Filled 2015-05-06: qty 5

## 2015-05-06 NOTE — ED Notes (Signed)
Patient not found in room or surrounding vicinity.  Blood and IV found on floor - pt pulled out his IV.  Patient gave no reason for leaving.  Pt remained awake, oriented, and was able to ambulate without assistance during entire stay in ER.

## 2015-05-06 NOTE — ED Notes (Signed)
Pt left hospital.

## 2015-05-06 NOTE — ED Provider Notes (Signed)
CSN: [id number]     Arrival date & time 05/06/15  4098 History   First MD Initiated Contact with Patient 05/06/15 0710     Chief Complaint  Patient presents with  . Shortness of Breath  . Cough      HPI Pt was seen at 0720. Per pt, c/o gradual onset and worsening of persistent cough and SOB for the past several weeks. Pt states his "chest hurts from all the coughing." Pt states he was admitted 1 month ago for dx pneumonia and "it feels like I got it again." Has been associated with subjective home fevers and generalized weakness/fatigue. Pt continues to smoke cigarettes. States he "stopped drinking" etoh several weeks ago. Denies palpitations, no abd pain, no N/V/D, no rash, no objective fevers.    Past Medical History  Diagnosis Date  . Anxiety   . Chronic back pain   . Seizures   . COPD (chronic obstructive pulmonary disease)   . Hypertension   . Kidney stones   . Asthma    Past Surgical History  Procedure Laterality Date  . Back surgery    . Appendectomy    . Ankle surgery     Family History  Problem Relation Age of Onset  . CAD Other   . Cancer Other   . Diabetes Other   . Diabetes type II Mother   . Depression Mother   . CAD Father    Social History  Substance Use Topics  . Smoking status: Current Every Day Smoker -- 0.50 packs/day    Types: Cigarettes  . Smokeless tobacco: None  . Alcohol Use: Yes     Comment: social     Review of Systems ROS: Statement: All systems negative except as marked or noted in the HPI; Constitutional: Negative for objective fever. +generalied weakness/fatigue.; ; Eyes: Negative for eye pain, redness and discharge. ; ; ENMT: Negative for ear pain, hoarseness, nasal congestion, sinus pressure and sore throat. ; ; Cardiovascular: Negative for palpitations, diaphoresis, and peripheral edema. ; ; Respiratory: +SOB, cough, pleuritic CP. Negative for wheezing and stridor. ; ; Gastrointestinal: Negative for nausea, vomiting, diarrhea,  abdominal pain, blood in stool, hematemesis, jaundice and rectal bleeding. . ; ; Genitourinary: Negative for dysuria, flank pain and hematuria. ; ; Musculoskeletal: Negative for back pain and neck pain. Negative for swelling and trauma.; ; Skin: Negative for pruritus, rash, abrasions, blisters, bruising and skin lesion.; ; Neuro: Negative for headache, lightheadedness and neck stiffness. Negative for altered level of consciousness , altered mental status, extremity weakness, paresthesias, involuntary movement, seizure and syncope.      Allergies  Penicillins; Ibuprofen; Nsaids; and Erythromycin  Home Medications   Prior to Admission medications   Medication Sig Start Date End Date Taking? Authorizing Provider  acetaminophen (TYLENOL) 500 MG tablet Take 500 mg by mouth every 6 (six) hours as needed for moderate pain.    Historical Provider, MD  albuterol (PROVENTIL HFA;VENTOLIN HFA) 108 (90 BASE) MCG/ACT inhaler Inhale 2 puffs into the lungs every 6 (six) hours as needed for wheezing or shortness of breath.    Historical Provider, MD  atenolol-chlorthalidone (TENORETIC) 100-25 MG per tablet Take 1 tablet by mouth daily. 04/04/15   Historical Provider, MD  buPROPion (WELLBUTRIN XL) 300 MG 24 hr tablet Take 300 mg by mouth every morning.     Historical Provider, MD  clonazePAM (KLONOPIN) 1 MG tablet Take 1 mg by mouth 2 (two) times daily as needed for anxiety.     Historical  Provider, MD  fluticasone (FLONASE) 50 MCG/ACT nasal spray Place 1-2 sprays into both nostrils daily as needed for allergies or rhinitis.     Historical Provider, MD  gabapentin (NEURONTIN) 300 MG capsule Take 300 mg by mouth 3 (three) times daily.    Historical Provider, MD  HYDROcodone-acetaminophen (NORCO) 10-325 MG per tablet Take 1 tablet by mouth every 6 (six) hours as needed for moderate pain.  01/27/15   Historical Provider, MD  levofloxacin (LEVAQUIN) 750 MG tablet Take 1 tablet (750 mg total) by mouth daily. Patient not  taking: Reported on 04/24/2015 04/10/15   Jerald Kief, MD  loratadine (CLARITIN) 10 MG tablet Take 10 mg by mouth every morning.     Historical Provider, MD  omeprazole (PRILOSEC) 20 MG capsule Take 20 mg by mouth daily.    Historical Provider, MD  sertraline (ZOLOFT) 100 MG tablet Take 100 mg by mouth every morning.     Historical Provider, MD   BP 110/59 mmHg  Pulse 99  Temp(Src) 98.7 F (37.1 C) (Oral)  Resp 20  SpO2 94% Physical Exam  0725: Physical examination:  Nursing notes reviewed; Vital signs and O2 SAT reviewed;  Constitutional: Well developed, Well nourished, Well hydrated, In no acute distress; Head:  Normocephalic, atraumatic; Eyes: EOMI, PERRL, No scleral icterus; ENMT: Mouth and pharynx normal, Mucous membranes moist; Neck: Supple, Full range of motion, No lymphadenopathy; Cardiovascular: Regular rate and rhythm, No murmur, rub, or gallop; Respiratory: Breath sounds coarse & equal bilaterally, No wheezes.  Speaking full sentences with ease, Normal respiratory effort/excursion. +moist cough during exam.; Chest: No deformity, Movement normal; Abdomen: Soft, Nontender, Nondistended, Normal bowel sounds; Genitourinary: No CVA tenderness; Extremities: Pulses normal, No tenderness, No edema, No calf edema or asymmetry.; Neuro: AA&Ox3, Major CN grossly intact.  Speech clear. No gross focal motor or sensory deficits in extremities.; Skin: Color normal, Warm, Dry.   ED Course  Procedures (including critical care time)  Labs Review  Imaging Review  I have personally reviewed and evaluated these images and lab results as part of my medical decision-making.   EKG Interpretation None      MDM  MDM Reviewed: previous chart, nursing note and vitals Reviewed previous: labs, ECG and x-ray Interpretation: labs, ECG and x-ray Total time providing critical care: 30-74 minutes. This excludes time spent performing separately reportable procedures and services.   CRITICAL  CARE Performed by: Laray Anger Total critical care time: 35 Critical care time was exclusive of separately billable procedures and treating other patients. Critical care was necessary to treat or prevent imminent or life-threatening deterioration. Critical care was time spent personally by me on the following activities: development of treatment plan with patient and/or surrogate as well as nursing, discussions with consultants, evaluation of patient's response to treatment, examination of patient, obtaining history from patient or surrogate, ordering and performing treatments and interventions, ordering and review of laboratory studies, ordering and review of radiographic studies, pulse oximetry and re-evaluation of patient's condition.   Dg Chest 2 View 05/06/2015   CLINICAL DATA:  Several day history of cough and congestion  EXAM: CHEST  2 VIEW  COMPARISON:  April 08, 2015  FINDINGS: Lungs are clear. Heart size and pulmonary vascularity are normal. No adenopathy. No bone lesions.  IMPRESSION: No edema or consolidation.   Electronically Signed   By: Bretta Bang III M.D.   On: 05/06/2015 07:45   Results for orders placed or performed during the hospital encounter of 05/06/15  Basic metabolic panel  Result Value Ref Range   Sodium 133 (L) 135 - 145 mmol/L   Potassium 4.3 3.5 - 5.1 mmol/L   Chloride 98 (L) 101 - 111 mmol/L   CO2 26 22 - 32 mmol/L   Glucose, Bld 97 65 - 99 mg/dL   BUN 31 (H) 6 - 20 mg/dL   Creatinine, Ser 1.61 (H) 0.61 - 1.24 mg/dL   Calcium 9.2 8.9 - 09.6 mg/dL   GFR calc non Af Amer 45 (L) >60 mL/min   GFR calc Af Amer 53 (L) >60 mL/min   Anion gap 9 5 - 15  Troponin I  Result Value Ref Range   Troponin I <0.03 <0.031 ng/mL  Lactic acid, plasma  Result Value Ref Range   Lactic Acid, Venous 1.4 0.5 - 2.0 mmol/L  D-dimer, quantitative  Result Value Ref Range   D-Dimer, Quant 0.46 0.00 - 0.48 ug/mL-FEU  CBC with Differential  Result Value Ref Range   WBC  16.6 (H) 4.0 - 10.5 K/uL   RBC 3.54 (L) 4.22 - 5.81 MIL/uL   Hemoglobin 11.8 (L) 13.0 - 17.0 g/dL   HCT 04.5 (L) 40.9 - 81.1 %   MCV 99.2 78.0 - 100.0 fL   MCH 33.3 26.0 - 34.0 pg   MCHC 33.6 30.0 - 36.0 g/dL   RDW 91.4 78.2 - 95.6 %   Platelets 210 150 - 400 K/uL   Neutrophils Relative % 89 %   Neutro Abs 14.6 (H) 1.7 - 7.7 K/uL   Lymphocytes Relative 7 %   Lymphs Abs 1.2 0.7 - 4.0 K/uL   Monocytes Relative 4 %   Monocytes Absolute 0.7 0.1 - 1.0 K/uL   Eosinophils Relative 0 %   Eosinophils Absolute 0.1 0.0 - 0.7 K/uL   Basophils Relative 0 %   Basophils Absolute 0.0 0.0 - 0.1 K/uL      0830: Pt is not orthostatic on VS. Short neb given: lungs continue coarse, faint scattered wheezes, no audible wheezing. Sats now 90% R/A. No infiltrate on CXR. Will dose hour long neb and prednisone.   1110:  BUN/Cr mildly elevated; IVF bolus given. Prednisone and hour long neb completed: O2 Sats 95% R/A at rest. Went to check on pt and his room was empty. Pt eloped.   Samuel Jester, DO 05/07/15 2022

## 2015-05-06 NOTE — ED Notes (Signed)
Patient appeared to be holding breath while taking orthostatic vitals signs, showing oxygen saturation around 90%.  Breathing treatment was set up and immediately patient's oxygen saturation improved to 99-100%.  Dr. Clarene Duke will be notified.

## 2015-05-06 NOTE — ED Notes (Signed)
Pt refused blood draw for lactic acid RN made aware

## 2015-05-06 NOTE — ED Notes (Signed)
Pt as recently admitted for pneumonia, pt continues to complain of being short of breath and rib pain, he also states that he is weak

## 2015-05-07 ENCOUNTER — Inpatient Hospital Stay (HOSPITAL_COMMUNITY)
Admission: EM | Admit: 2015-05-07 | Discharge: 2015-05-24 | DRG: 870 | Disposition: A | Discharge status: Home Health | Payer: Medicare Other | Attending: Internal Medicine | Admitting: Internal Medicine

## 2015-05-07 ENCOUNTER — Emergency Department (HOSPITAL_COMMUNITY): Payer: Medicare Other

## 2015-05-07 ENCOUNTER — Inpatient Hospital Stay (HOSPITAL_COMMUNITY): Payer: Medicare Other

## 2015-05-07 ENCOUNTER — Encounter (HOSPITAL_COMMUNITY): Payer: Self-pay | Admitting: Emergency Medicine

## 2015-05-07 DIAGNOSIS — A419 Sepsis, unspecified organism: Principal | ICD-10-CM | POA: Diagnosis present

## 2015-05-07 DIAGNOSIS — N179 Acute kidney failure, unspecified: Secondary | ICD-10-CM | POA: Diagnosis not present

## 2015-05-07 DIAGNOSIS — Z1623 Resistance to quinolones and fluoroquinolones: Secondary | ICD-10-CM | POA: Diagnosis present

## 2015-05-07 DIAGNOSIS — Y95 Nosocomial condition: Secondary | ICD-10-CM | POA: Diagnosis present

## 2015-05-07 DIAGNOSIS — R55 Syncope and collapse: Secondary | ICD-10-CM | POA: Diagnosis not present

## 2015-05-07 DIAGNOSIS — E440 Moderate protein-calorie malnutrition: Secondary | ICD-10-CM | POA: Diagnosis not present

## 2015-05-07 DIAGNOSIS — Z880 Allergy status to penicillin: Secondary | ICD-10-CM

## 2015-05-07 DIAGNOSIS — I272 Other secondary pulmonary hypertension: Secondary | ICD-10-CM | POA: Diagnosis present

## 2015-05-07 DIAGNOSIS — G40909 Epilepsy, unspecified, not intractable, without status epilepticus: Secondary | ICD-10-CM | POA: Diagnosis present

## 2015-05-07 DIAGNOSIS — M6282 Rhabdomyolysis: Secondary | ICD-10-CM | POA: Diagnosis present

## 2015-05-07 DIAGNOSIS — K5900 Constipation, unspecified: Secondary | ICD-10-CM

## 2015-05-07 DIAGNOSIS — R0602 Shortness of breath: Secondary | ICD-10-CM | POA: Diagnosis present

## 2015-05-07 DIAGNOSIS — Z886 Allergy status to analgesic agent status: Secondary | ICD-10-CM

## 2015-05-07 DIAGNOSIS — F1010 Alcohol abuse, uncomplicated: Secondary | ICD-10-CM | POA: Diagnosis present

## 2015-05-07 DIAGNOSIS — T424X2A Poisoning by benzodiazepines, intentional self-harm, initial encounter: Secondary | ICD-10-CM | POA: Diagnosis present

## 2015-05-07 DIAGNOSIS — F1410 Cocaine abuse, uncomplicated: Secondary | ICD-10-CM | POA: Diagnosis present

## 2015-05-07 DIAGNOSIS — Z881 Allergy status to other antibiotic agents status: Secondary | ICD-10-CM

## 2015-05-07 DIAGNOSIS — E870 Hyperosmolality and hypernatremia: Secondary | ICD-10-CM | POA: Diagnosis present

## 2015-05-07 DIAGNOSIS — I10 Essential (primary) hypertension: Secondary | ICD-10-CM | POA: Diagnosis present

## 2015-05-07 DIAGNOSIS — Z781 Physical restraint status: Secondary | ICD-10-CM

## 2015-05-07 DIAGNOSIS — J449 Chronic obstructive pulmonary disease, unspecified: Secondary | ICD-10-CM | POA: Diagnosis present

## 2015-05-07 DIAGNOSIS — E8770 Fluid overload, unspecified: Secondary | ICD-10-CM | POA: Diagnosis present

## 2015-05-07 DIAGNOSIS — E781 Pure hyperglyceridemia: Secondary | ICD-10-CM | POA: Diagnosis present

## 2015-05-07 DIAGNOSIS — J189 Pneumonia, unspecified organism: Secondary | ICD-10-CM

## 2015-05-07 DIAGNOSIS — Z833 Family history of diabetes mellitus: Secondary | ICD-10-CM

## 2015-05-07 DIAGNOSIS — D696 Thrombocytopenia, unspecified: Secondary | ICD-10-CM | POA: Diagnosis present

## 2015-05-07 DIAGNOSIS — D649 Anemia, unspecified: Secondary | ICD-10-CM | POA: Diagnosis present

## 2015-05-07 DIAGNOSIS — M549 Dorsalgia, unspecified: Secondary | ICD-10-CM | POA: Diagnosis present

## 2015-05-07 DIAGNOSIS — J9602 Acute respiratory failure with hypercapnia: Secondary | ICD-10-CM | POA: Diagnosis present

## 2015-05-07 DIAGNOSIS — J9600 Acute respiratory failure, unspecified whether with hypoxia or hypercapnia: Secondary | ICD-10-CM | POA: Insufficient documentation

## 2015-05-07 DIAGNOSIS — F1310 Sedative, hypnotic or anxiolytic abuse, uncomplicated: Secondary | ICD-10-CM | POA: Diagnosis present

## 2015-05-07 DIAGNOSIS — G8929 Other chronic pain: Secondary | ICD-10-CM | POA: Diagnosis present

## 2015-05-07 DIAGNOSIS — J80 Acute respiratory distress syndrome: Secondary | ICD-10-CM | POA: Diagnosis not present

## 2015-05-07 DIAGNOSIS — J690 Pneumonitis due to inhalation of food and vomit: Secondary | ICD-10-CM | POA: Diagnosis present

## 2015-05-07 DIAGNOSIS — E875 Hyperkalemia: Secondary | ICD-10-CM | POA: Diagnosis present

## 2015-05-07 DIAGNOSIS — Z452 Encounter for adjustment and management of vascular access device: Secondary | ICD-10-CM

## 2015-05-07 DIAGNOSIS — R6521 Severe sepsis with septic shock: Secondary | ICD-10-CM | POA: Diagnosis present

## 2015-05-07 DIAGNOSIS — T50902A Poisoning by unspecified drugs, medicaments and biological substances, intentional self-harm, initial encounter: Secondary | ICD-10-CM | POA: Diagnosis present

## 2015-05-07 DIAGNOSIS — Z23 Encounter for immunization: Secondary | ICD-10-CM

## 2015-05-07 DIAGNOSIS — Z6827 Body mass index (BMI) 27.0-27.9, adult: Secondary | ICD-10-CM

## 2015-05-07 DIAGNOSIS — Z978 Presence of other specified devices: Secondary | ICD-10-CM

## 2015-05-07 DIAGNOSIS — N170 Acute kidney failure with tubular necrosis: Secondary | ICD-10-CM | POA: Diagnosis present

## 2015-05-07 DIAGNOSIS — J9601 Acute respiratory failure with hypoxia: Secondary | ICD-10-CM | POA: Diagnosis present

## 2015-05-07 DIAGNOSIS — R451 Restlessness and agitation: Secondary | ICD-10-CM | POA: Diagnosis present

## 2015-05-07 DIAGNOSIS — Z8249 Family history of ischemic heart disease and other diseases of the circulatory system: Secondary | ICD-10-CM

## 2015-05-07 DIAGNOSIS — Z79899 Other long term (current) drug therapy: Secondary | ICD-10-CM

## 2015-05-07 DIAGNOSIS — E876 Hypokalemia: Secondary | ICD-10-CM | POA: Diagnosis present

## 2015-05-07 DIAGNOSIS — J9690 Respiratory failure, unspecified, unspecified whether with hypoxia or hypercapnia: Secondary | ICD-10-CM

## 2015-05-07 DIAGNOSIS — R4182 Altered mental status, unspecified: Secondary | ICD-10-CM | POA: Insufficient documentation

## 2015-05-07 DIAGNOSIS — Z4659 Encounter for fitting and adjustment of other gastrointestinal appliance and device: Secondary | ICD-10-CM

## 2015-05-07 DIAGNOSIS — E872 Acidosis: Secondary | ICD-10-CM | POA: Diagnosis present

## 2015-05-07 DIAGNOSIS — T1491 Suicide attempt: Secondary | ICD-10-CM | POA: Diagnosis not present

## 2015-05-07 DIAGNOSIS — Z87442 Personal history of urinary calculi: Secondary | ICD-10-CM

## 2015-05-07 DIAGNOSIS — G9340 Encephalopathy, unspecified: Secondary | ICD-10-CM | POA: Diagnosis present

## 2015-05-07 DIAGNOSIS — R410 Disorientation, unspecified: Secondary | ICD-10-CM | POA: Diagnosis not present

## 2015-05-07 DIAGNOSIS — W19XXXA Unspecified fall, initial encounter: Secondary | ICD-10-CM

## 2015-05-07 DIAGNOSIS — F17210 Nicotine dependence, cigarettes, uncomplicated: Secondary | ICD-10-CM | POA: Diagnosis present

## 2015-05-07 LAB — URINE MICROSCOPIC-ADD ON

## 2015-05-07 LAB — BLOOD GAS, ARTERIAL
ACID-BASE DEFICIT: 12 mmol/L — AB (ref 0.0–2.0)
Acid-base deficit: 9.6 mmol/L — ABNORMAL HIGH (ref 0.0–2.0)
Acid-base deficit: 7.1 mmol/L — ABNORMAL HIGH (ref 0.0–2.0)
BICARBONATE: 19.8 meq/L — AB (ref 20.0–24.0)
Bicarbonate: 18.9 mEq/L — ABNORMAL LOW (ref 20.0–24.0)
Bicarbonate: 19.1 mEq/L — ABNORMAL LOW (ref 20.0–24.0)
DRAWN BY: 103701
DRAWN BY: 308601
Drawn by: 103701
FIO2: 1
FIO2: 1
FIO2: 0.7
MECHVT: 600 mL
MECHVT: 600 mL
O2 Saturation: 94 %
O2 Saturation: 91.8 %
O2 Saturation: 78.4 %
PEEP/CPAP: 10 cmH2O
PEEP: 10 cmH2O
PEEP: 10 cmH2O
PO2 ART: 82.6 mmHg (ref 80.0–100.0)
PO2 ART: 52.1 mmHg — AB (ref 80.0–100.0)
Patient temperature: 98.6
Patient temperature: 37.7
Patient temperature: 38
RATE: 14 resp/min
RATE: 30 resp/min
RATE: 35 resp/min
TCO2: 19.6 mmol/L (ref 0–100)
TCO2: 18.2 mmol/L (ref 0–100)
TCO2: 17.9 mmol/L (ref 0–100)
VT: 550 mL
pCO2 arterial: 74.9 mmHg (ref 35.0–45.0)
pCO2 arterial: 56.5 mmHg — ABNORMAL HIGH (ref 35.0–45.0)
pCO2 arterial: 45.2 mmHg — ABNORMAL HIGH (ref 35.0–45.0)
pH, Arterial: 7.05 — CL (ref 7.350–7.450)
pH, Arterial: 7.156 — CL (ref 7.350–7.450)
pH, Arterial: 7.255 — ABNORMAL LOW (ref 7.350–7.450)
pO2, Arterial: 102 mmHg — ABNORMAL HIGH (ref 80.0–100.0)

## 2015-05-07 LAB — URINALYSIS, ROUTINE W REFLEX MICROSCOPIC
Bilirubin Urine: NEGATIVE
GLUCOSE, UA: NEGATIVE mg/dL
Ketones, ur: NEGATIVE mg/dL
LEUKOCYTES UA: NEGATIVE
Nitrite: NEGATIVE
PROTEIN: 30 mg/dL — AB
SPECIFIC GRAVITY, URINE: 1.025 (ref 1.005–1.030)
Urobilinogen, UA: 0.2 mg/dL (ref 0.0–1.0)
pH: 5 (ref 5.0–8.0)

## 2015-05-07 LAB — RAPID URINE DRUG SCREEN, HOSP PERFORMED
AMPHETAMINES: NOT DETECTED
BENZODIAZEPINES: POSITIVE — AB
Barbiturates: NOT DETECTED
COCAINE: NOT DETECTED
OPIATES: POSITIVE — AB
Tetrahydrocannabinol: POSITIVE — AB

## 2015-05-07 LAB — I-STAT TROPONIN, ED: Troponin i, poc: 0.02 ng/mL (ref 0.00–0.08)

## 2015-05-07 LAB — COMPREHENSIVE METABOLIC PANEL
ALBUMIN: 3.6 g/dL (ref 3.5–5.0)
ALK PHOS: 57 U/L (ref 38–126)
ALT: 18 U/L (ref 17–63)
ANION GAP: 15 (ref 5–15)
AST: 72 U/L — AB (ref 15–41)
BILIRUBIN TOTAL: 0.5 mg/dL (ref 0.3–1.2)
BUN: 61 mg/dL — AB (ref 6–20)
CO2: 17 mmol/L — AB (ref 22–32)
Calcium: 8.1 mg/dL — ABNORMAL LOW (ref 8.9–10.3)
Chloride: 102 mmol/L (ref 101–111)
Creatinine, Ser: 5.33 mg/dL — ABNORMAL HIGH (ref 0.61–1.24)
GFR calc Af Amer: 14 mL/min — ABNORMAL LOW (ref >60)
GFR calc non Af Amer: 12 mL/min — ABNORMAL LOW (ref >60)
GLUCOSE: 98 mg/dL (ref 65–99)
POTASSIUM: 6 mmol/L — AB (ref 3.5–5.1)
SODIUM: 134 mmol/L — AB (ref 135–145)
TOTAL PROTEIN: 6.9 g/dL (ref 6.5–8.1)

## 2015-05-07 LAB — ETHANOL: Alcohol, Ethyl (B): <5 mg/dL (ref <5)

## 2015-05-07 LAB — BASIC METABOLIC PANEL
ANION GAP: 10 (ref 5–15)
BUN: 60 mg/dL — AB (ref 6–20)
CALCIUM: 7.4 mg/dL — AB (ref 8.9–10.3)
CO2: 20 mmol/L — ABNORMAL LOW (ref 22–32)
Chloride: 103 mmol/L (ref 101–111)
Creatinine, Ser: 4.61 mg/dL — ABNORMAL HIGH (ref 0.61–1.24)
GFR calc Af Amer: 17 mL/min — ABNORMAL LOW (ref >60)
GFR, EST NON AFRICAN AMERICAN: 14 mL/min — AB (ref >60)
GLUCOSE: 141 mg/dL — AB (ref 65–99)
Potassium: 7.1 mmol/L (ref 3.5–5.1)
Sodium: 133 mmol/L — ABNORMAL LOW (ref 135–145)

## 2015-05-07 LAB — CBC WITH DIFFERENTIAL/PLATELET
BASOS ABS: 0 10*3/uL (ref 0.0–0.1)
Basophils Relative: 0 %
EOS PCT: 0 %
Eosinophils Absolute: 0 10*3/uL (ref 0.0–0.7)
HEMATOCRIT: 31.9 % — AB (ref 39.0–52.0)
Hemoglobin: 10.5 g/dL — ABNORMAL LOW (ref 13.0–17.0)
LYMPHS PCT: 9 %
Lymphs Abs: 0.9 10*3/uL (ref 0.7–4.0)
MCH: 32.8 pg (ref 26.0–34.0)
MCHC: 32.9 g/dL (ref 30.0–36.0)
MCV: 99.7 fL (ref 78.0–100.0)
MONO ABS: 0.2 10*3/uL (ref 0.1–1.0)
MONOS PCT: 2 %
NEUTROS ABS: 9.6 10*3/uL — AB (ref 1.7–7.7)
Neutrophils Relative %: 89 %
PLATELETS: 188 10*3/uL (ref 150–400)
RBC: 3.2 MIL/uL — ABNORMAL LOW (ref 4.22–5.81)
RDW: 15.6 % — AB (ref 11.5–15.5)
WBC: 10.8 10*3/uL — ABNORMAL HIGH (ref 4.0–10.5)

## 2015-05-07 LAB — EXPECTORATED SPUTUM ASSESSMENT W REFEX TO RESP CULTURE

## 2015-05-07 LAB — MRSA PCR SCREENING: MRSA BY PCR: NEGATIVE

## 2015-05-07 LAB — TROPONIN I: Troponin I: <0.03 ng/mL (ref <0.031)

## 2015-05-07 LAB — CK: Total CK: 2199 U/L — ABNORMAL HIGH (ref 49–397)

## 2015-05-07 LAB — CARBOXYHEMOGLOBIN
Carboxyhemoglobin: 1.7 % — ABNORMAL HIGH (ref 0.5–1.5)
METHEMOGLOBIN: 1.1 % (ref 0.0–1.5)
O2 SAT: 80.7 %
TOTAL HEMOGLOBIN: 11.3 g/dL — AB (ref 13.5–18.0)

## 2015-05-07 LAB — I-STAT CG4 LACTIC ACID, ED
Lactic Acid, Venous: 2.47 mmol/L (ref 0.5–2.0)
Lactic Acid, Venous: 1.73 mmol/L (ref 0.5–2.0)

## 2015-05-07 LAB — STREP PNEUMONIAE URINARY ANTIGEN: Strep Pneumo Urinary Antigen: NEGATIVE

## 2015-05-07 LAB — PROCALCITONIN: PROCALCITONIN: 16.09 ng/mL

## 2015-05-07 LAB — LACTIC ACID, PLASMA: LACTIC ACID, VENOUS: 2.1 mmol/L — AB (ref 0.5–2.0)

## 2015-05-07 LAB — EXPECTORATED SPUTUM ASSESSMENT W GRAM STAIN, RFLX TO RESP C

## 2015-05-07 LAB — GLUCOSE, CAPILLARY: GLUCOSE-CAPILLARY: 129 mg/dL — AB (ref 65–99)

## 2015-05-07 LAB — SALICYLATE LEVEL

## 2015-05-07 LAB — ACETAMINOPHEN LEVEL: ACETAMINOPHEN (TYLENOL), SERUM: 18 ug/mL (ref 10–30)

## 2015-05-07 MED ORDER — DEXTROSE 5 % IV SOLN
2.0000 g | Freq: Once | INTRAVENOUS | Status: AC
  Administered 2015-05-07: 2 g via INTRAVENOUS
  Filled 2015-05-07: qty 2

## 2015-05-07 MED ORDER — SODIUM CHLORIDE 0.9 % IV BOLUS (SEPSIS)
1000.0000 mL | INTRAVENOUS | Status: AC
  Administered 2015-05-07: 1000 mL via INTRAVENOUS

## 2015-05-07 MED ORDER — ETOMIDATE 2 MG/ML IV SOLN: 20.0000 mg | Freq: Once | INTRAVENOUS | Status: DC

## 2015-05-07 MED ORDER — PANTOPRAZOLE SODIUM 40 MG IV SOLR
40.0000 mg | INTRAVENOUS | Status: DC
  Administered 2015-05-07 – 2015-05-18 (×12): 40 mg via INTRAVENOUS
  Filled 2015-05-07 (×12): qty 40

## 2015-05-07 MED ORDER — VANCOMYCIN HCL IN DEXTROSE 1-5 GM/200ML-% IV SOLN
1000.0000 mg | Freq: Once | INTRAVENOUS | Status: AC
Start: 2015-05-07 — End: 2015-05-07
  Administered 2015-05-07: 1000 mg via INTRAVENOUS
  Filled 2015-05-07: qty 200

## 2015-05-07 MED ORDER — CHLORHEXIDINE GLUCONATE 0.12% ORAL RINSE (MEDLINE KIT)
15.0000 mL | Freq: Two times a day (BID) | OROMUCOSAL | Status: DC
  Administered 2015-05-07 – 2015-05-17 (×21): 15 mL via OROMUCOSAL

## 2015-05-07 MED ORDER — SODIUM BICARBONATE 8.4 % IV SOLN
50.0000 meq | Freq: Once | INTRAVENOUS | Status: AC
  Administered 2015-05-07: 50 meq via INTRAVENOUS
  Filled 2015-05-07: qty 50

## 2015-05-07 MED ORDER — FAMOTIDINE IN NACL 20-0.9 MG/50ML-% IV SOLN
20.0000 mg | INTRAVENOUS | Status: DC
Start: 2015-05-07 — End: 2015-05-12
  Administered 2015-05-07 – 2015-05-11 (×5): 20 mg via INTRAVENOUS
  Filled 2015-05-07 (×5): qty 50

## 2015-05-07 MED ORDER — CHLORHEXIDINE GLUCONATE 0.12 % MT SOLN
OROMUCOSAL | Status: AC
  Administered 2015-05-07: 15 mL via OROMUCOSAL
  Filled 2015-05-07: qty 15

## 2015-05-07 MED ORDER — INSULIN ASPART 100 UNIT/ML ~~LOC~~ SOLN
2.0000 [IU] | SUBCUTANEOUS | Status: DC
  Administered 2015-05-07 – 2015-05-11 (×8): 2 [IU] via SUBCUTANEOUS
  Administered 2015-05-11: 4 [IU] via SUBCUTANEOUS
  Administered 2015-05-12 – 2015-05-13 (×5): 2 [IU] via SUBCUTANEOUS

## 2015-05-07 MED ORDER — SODIUM CHLORIDE 0.9 % IV SOLN
INTRAVENOUS | Status: DC | PRN
Start: 2015-05-07 — End: 2015-05-17

## 2015-05-07 MED ORDER — HEPARIN SODIUM (PORCINE) 5000 UNIT/ML IJ SOLN
5000.0000 [IU] | Freq: Three times a day (TID) | INTRAMUSCULAR | Status: DC
  Administered 2015-05-07 – 2015-05-13 (×17): 5000 [IU] via SUBCUTANEOUS
  Filled 2015-05-07 (×17): qty 1

## 2015-05-07 MED ORDER — MIDAZOLAM HCL 2 MG/2ML IJ SOLN
INTRAMUSCULAR | Status: AC
  Administered 2015-05-07: 2 mg via INTRAVENOUS
  Filled 2015-05-07: qty 2

## 2015-05-07 MED ORDER — ALBUTEROL SULFATE (2.5 MG/3ML) 0.083% IN NEBU: 2.5000 mg | INHALATION_SOLUTION | RESPIRATORY_TRACT | Status: DC | PRN

## 2015-05-07 MED ORDER — ETOMIDATE 2 MG/ML IV SOLN
INTRAVENOUS | Status: DC | PRN
  Administered 2015-05-07 – 2015-05-09 (×2): 20 mg via INTRAVENOUS

## 2015-05-07 MED ORDER — FENTANYL BOLUS VIA INFUSION
50.0000 ug | INTRAVENOUS | Status: DC | PRN
  Administered 2015-05-07 – 2015-05-11 (×9): 50 ug via INTRAVENOUS
  Filled 2015-05-07 (×2): qty 50

## 2015-05-07 MED ORDER — SODIUM POLYSTYRENE SULFONATE 15 GM/60ML PO SUSP
15.0000 g | Freq: Once | ORAL | Status: AC
  Administered 2015-05-07: 15 g
  Filled 2015-05-07: qty 60

## 2015-05-07 MED ORDER — SODIUM CHLORIDE 0.9 % IV SOLN: 25.0000 ug/h | INTRAVENOUS | Status: DC

## 2015-05-07 MED ORDER — MIDAZOLAM HCL 2 MG/2ML IJ SOLN
2.0000 mg | INTRAMUSCULAR | Status: AC | PRN
  Administered 2015-05-07 (×3): 2 mg via INTRAVENOUS
  Filled 2015-05-07 (×2): qty 2

## 2015-05-07 MED ORDER — PROPOFOL 1000 MG/100ML IV EMUL
5.0000 ug/kg/min | INTRAVENOUS | Status: DC
  Administered 2015-05-07: 20 ug/kg/min via INTRAVENOUS
  Administered 2015-05-08: 25 ug/kg/min via INTRAVENOUS
  Administered 2015-05-08: 55 ug/kg/min via INTRAVENOUS
  Administered 2015-05-08: 30 ug/kg/min via INTRAVENOUS
  Administered 2015-05-08: 20 ug/kg/min via INTRAVENOUS
  Administered 2015-05-09: 40 ug/kg/min via INTRAVENOUS
  Administered 2015-05-09 (×2): 50 ug/kg/min via INTRAVENOUS
  Filled 2015-05-07 (×7): qty 100

## 2015-05-07 MED ORDER — IPRATROPIUM-ALBUTEROL 0.5-2.5 (3) MG/3ML IN SOLN
3.0000 mL | Freq: Once | RESPIRATORY_TRACT | Status: AC
  Administered 2015-05-07: 3 mL via RESPIRATORY_TRACT
  Filled 2015-05-07: qty 3

## 2015-05-07 MED ORDER — ROCURONIUM BROMIDE 50 MG/5ML IV SOLN
INTRAVENOUS | Status: AC
  Filled 2015-05-07: qty 2

## 2015-05-07 MED ORDER — SUCCINYLCHOLINE CHLORIDE 20 MG/ML IJ SOLN
INTRAMUSCULAR | Status: AC
  Filled 2015-05-07: qty 1

## 2015-05-07 MED ORDER — FUROSEMIDE 10 MG/ML IJ SOLN
80.0000 mg | Freq: Once | INTRAMUSCULAR | Status: AC
  Administered 2015-05-07: 80 mg via INTRAVENOUS
  Filled 2015-05-07: qty 8

## 2015-05-07 MED ORDER — SODIUM CHLORIDE 0.9 % IV SOLN
10.0000 ug/h | INTRAVENOUS | Status: DC
  Administered 2015-05-07: 10 ug/h via INTRAVENOUS
  Filled 2015-05-07: qty 50

## 2015-05-07 MED ORDER — HYDROCORTISONE NA SUCCINATE PF 100 MG IJ SOLR
50.0000 mg | Freq: Four times a day (QID) | INTRAMUSCULAR | Status: DC
  Administered 2015-05-07 – 2015-05-08 (×3): 50 mg via INTRAVENOUS
  Filled 2015-05-07 (×3): qty 2

## 2015-05-07 MED ORDER — ANTISEPTIC ORAL RINSE SOLUTION (CORINZ)
7.0000 mL | Freq: Four times a day (QID) | OROMUCOSAL | Status: DC
  Administered 2015-05-07 – 2015-05-13 (×22): 7 mL via OROMUCOSAL

## 2015-05-07 MED ORDER — LIDOCAINE HCL (CARDIAC) 20 MG/ML IV SOLN
INTRAVENOUS | Status: AC
  Filled 2015-05-07: qty 5

## 2015-05-07 MED ORDER — NOREPINEPHRINE BITARTRATE 1 MG/ML IV SOLN
5.0000 ug/min | INTRAVENOUS | Status: DC
  Administered 2015-05-07: 35 ug/min via INTRAVENOUS
  Administered 2015-05-07: 5 ug/min via INTRAVENOUS
  Filled 2015-05-07 (×2): qty 4

## 2015-05-07 MED ORDER — SODIUM CHLORIDE 0.9 % IV SOLN
250.0000 mL | INTRAVENOUS | Status: DC | PRN
  Administered 2015-05-22: 250 mL via INTRAVENOUS

## 2015-05-07 MED ORDER — MIDAZOLAM HCL 2 MG/2ML IJ SOLN
2.0000 mg | INTRAMUSCULAR | Status: DC | PRN
  Administered 2015-05-07: 2 mg via INTRAVENOUS
  Filled 2015-05-07: qty 2

## 2015-05-07 MED ORDER — SODIUM CHLORIDE 0.9 % IV BOLUS (SEPSIS)
1000.0000 mL | Freq: Once | INTRAVENOUS | Status: AC
  Administered 2015-05-07: 1000 mL via INTRAVENOUS

## 2015-05-07 MED ORDER — ROCURONIUM BROMIDE 50 MG/5ML IV SOLN
INTRAVENOUS | Status: DC | PRN
  Administered 2015-05-07: 80 mg via INTRAVENOUS

## 2015-05-07 MED ORDER — ETOMIDATE 2 MG/ML IV SOLN
INTRAVENOUS | Status: AC
  Filled 2015-05-07: qty 20

## 2015-05-07 MED ORDER — PHENYLEPHRINE HCL 10 MG/ML IJ SOLN
0.0000 ug/min | Freq: Once | INTRAVENOUS | Status: AC
  Administered 2015-05-07: 20 ug/min via INTRAVENOUS
  Filled 2015-05-07: qty 1

## 2015-05-07 MED ORDER — DEXTROSE 5 % IV SOLN
5.0000 ug/min | INTRAVENOUS | Status: DC
  Administered 2015-05-07: 25 ug/min via INTRAVENOUS
  Administered 2015-05-09 – 2015-05-10 (×2): 5 ug/min via INTRAVENOUS
  Administered 2015-05-11: 10 ug/min via INTRAVENOUS
  Administered 2015-05-12: 18 ug/min via INTRAVENOUS
  Administered 2015-05-13 (×2): 20 ug/min via INTRAVENOUS
  Administered 2015-05-16 – 2015-05-17 (×3): 5 ug/min via INTRAVENOUS
  Filled 2015-05-07 (×9): qty 16

## 2015-05-07 MED ORDER — SODIUM CHLORIDE 0.9 % IV BOLUS (SEPSIS)
500.0000 mL | INTRAVENOUS | Status: AC
  Administered 2015-05-07: 500 mL via INTRAVENOUS

## 2015-05-07 MED ORDER — DEXTROSE 5 % IV SOLN
1.0000 g | Freq: Three times a day (TID) | INTRAVENOUS | Status: DC
  Administered 2015-05-08 – 2015-05-10 (×8): 1 g via INTRAVENOUS
  Filled 2015-05-07 (×8): qty 1

## 2015-05-07 MED ORDER — FENTANYL CITRATE (PF) 100 MCG/2ML IJ SOLN: 50.0000 ug | Freq: Once | INTRAMUSCULAR | Status: DC

## 2015-05-07 MED ORDER — SODIUM CHLORIDE 0.9 % IV SOLN
INTRAVENOUS | Status: DC
  Administered 2015-05-07 – 2015-05-17 (×7): via INTRAVENOUS

## 2015-05-07 MED ORDER — NOREPINEPHRINE BITARTRATE 1 MG/ML IV SOLN
2.0000 ug/min | INTRAVENOUS | Status: DC
  Filled 2015-05-07: qty 4

## 2015-05-07 MED ORDER — STERILE WATER FOR INJECTION IV SOLN
INTRAVENOUS | Status: DC
  Administered 2015-05-07 – 2015-05-08 (×2): via INTRAVENOUS
  Filled 2015-05-07 (×4): qty 850

## 2015-05-07 MED ORDER — ROCURONIUM BROMIDE 100 MG/10ML IV SOLN: 1.0000 mg/kg | Freq: Once | INTRAVENOUS | Status: DC

## 2015-05-07 NOTE — ED Notes (Signed)
Report given to Ruby, RN.

## 2015-05-07 NOTE — ED Notes (Signed)
MD at bedside. 

## 2015-05-07 NOTE — ED Notes (Signed)
Bed: RESB Expected date:  Expected time:  Means of arrival:  Comments: Room 12

## 2015-05-07 NOTE — ED Notes (Signed)
RN [place] starting IV currently

## 2015-05-07 NOTE — ED Notes (Signed)
Pt arrived via EMS with report of SHOB and audible wheezing. Pt Klonopin  tabs filled on 04/27/15 with 90 missing and Norco 10/325mg  filled on 05/04/2015 with 64 missing. Pt reported seen here yesterday and left AMA. Color pale and generalized weakness. Pt was given Solumedrol , Duoneb tx x1 and NS bolus of en-route.

## 2015-05-07 NOTE — ED Provider Notes (Addendum)
CSN: [id number]     Arrival date & time 05/07/15  1154 History   First MD Initiated Contact with Patient 05/07/15 1159     Chief Complaint  Patient presents with  . Shortness of Breath     (Consider location/radiation/quality/duration/timing/severity/associated sxs/prior Treatment) HPI Comments: Patient presents with shortness of breath and wheezing. He has a history of chronic back pain, COPD, hypertension and alcohol abuse. He was brought in by EMS for shortness of breath. He was treated about a month ago for pneumonia per his report. He was seen yesterday at [facility] long in emergency department for similar symptoms. He had a negative chest x-ray but left prior to complete treatment. He comes back today with increased weakness shortness of breath and wheezing. He has a worsening cough. He reports subjective fevers at home. She hasn't had any alcohol use in the last 6 days. EMS reported that he has a large amount of his opioid medication and Klonopin missing from his bottles. He states that he hasn't taken any increased amounts of these medicines in that he has an extra bottle home which he puts them in. He denies any other drug use other than marijuana. Denies vomiting. He's had some diarrhea that started through the night. He has complaints of pain to his back which is consistent with his chronic back pain.   Past Medical History  Diagnosis Date  . Anxiety   . Chronic back pain   . Seizures   . COPD (chronic obstructive pulmonary disease)   . Hypertension   . Kidney stones   . Asthma    Past Surgical History  Procedure Laterality Date  . Back surgery    . Appendectomy    . Ankle surgery     Family History  Problem Relation Age of Onset  . CAD Other   . Cancer Other   . Diabetes Other   . Diabetes type II Mother   . Depression Mother   . CAD Father    Social History  Substance Use Topics  . Smoking status: Current Every Day Smoker -- 0.50 packs/day    Types: Cigarettes  .  Smokeless tobacco: None  . Alcohol Use: Yes     Comment: social     Review of Systems  Constitutional: Positive for fatigue. Negative for fever, chills and diaphoresis.  HENT: Negative for congestion, rhinorrhea and sneezing.   Eyes: Negative.   Respiratory: Positive for cough, shortness of breath and wheezing. Negative for chest tightness.   Cardiovascular: Negative for chest pain and leg swelling.  Gastrointestinal: Positive for diarrhea. Negative for nausea, vomiting, abdominal pain and blood in stool.  Genitourinary: Negative for frequency, hematuria, flank pain and difficulty urinating.  Musculoskeletal: Positive for back pain. Negative for arthralgias.  Skin: Negative for rash.  Neurological: Positive for light-headedness. Negative for dizziness, speech difficulty, weakness, numbness and headaches.      Allergies  Penicillins; Ibuprofen; Nsaids; and Erythromycin  Home Medications   Prior to Admission medications   Medication Sig Start Date End Date Taking? Authorizing Provider  acetaminophen (TYLENOL) 500 MG tablet Take 1,000 mg by mouth every 6 (six) hours as needed for moderate pain.    Yes Historical Provider, MD  albuterol (PROVENTIL HFA;VENTOLIN HFA) 108 (90 BASE) MCG/ACT inhaler Inhale 2 puffs into the lungs every 6 (six) hours as needed for wheezing or shortness of breath.   Yes Historical Provider, MD  albuterol (PROVENTIL) (2.5 MG/3ML) 0.083% nebulizer solution Take 2.5 mg by nebulization every 6 (six)  hours as needed for wheezing or shortness of breath.   Yes Historical Provider, MD  atenolol-chlorthalidone (TENORETIC) 100-25 MG per tablet Take 1 tablet by mouth daily. 04/04/15   Historical Provider, MD  buPROPion (WELLBUTRIN XL) 300 MG 24 hr tablet Take 300 mg by mouth every morning.     Historical Provider, MD  clonazePAM (KLONOPIN) 1 MG tablet Take 1 mg by mouth 2 (two) times daily as needed for anxiety.     Historical Provider, MD  fluticasone (FLONASE) 50 MCG/ACT  nasal spray Place 1-2 sprays into both nostrils daily as needed for allergies or rhinitis.     Historical Provider, MD  gabapentin (NEURONTIN) 300 MG capsule Take 300 mg by mouth 3 (three) times daily.    Historical Provider, MD  HYDROcodone-acetaminophen (NORCO) 10-325 MG per tablet Take 1 tablet by mouth every 6 (six) hours as needed for moderate pain.  01/27/15   Historical Provider, MD  levofloxacin (LEVAQUIN) 750 MG tablet Take 1 tablet (750 mg total) by mouth daily. Patient not taking: Reported on 04/24/2015 04/10/15   Jerald Kief, MD  loratadine (CLARITIN) 10 MG tablet Take 10 mg by mouth every morning.     Historical Provider, MD  omeprazole (PRILOSEC) 20 MG capsule Take 20 mg by mouth daily.    Historical Provider, MD  sertraline (ZOLOFT) 100 MG tablet Take 100 mg by mouth every morning.     Historical Provider, MD   BP 100/57 mmHg  Pulse 83  Temp(Src) 98.8 F (37.1 C) (Core (Comment))  Resp 14  Ht 5\' 9"  (1.753 m)  Wt 165 lb (74.844 kg)  BMI 24.36 kg/m2  SpO2 92% Physical Exam  Constitutional: He is oriented to person, place, and time. He appears well-developed and well-nourished. He appears distressed.  Patient appears pale  HENT:  Head: Normocephalic and atraumatic.  Eyes: Pupils are equal, round, and reactive to light.  Neck: Normal range of motion. Neck supple.  Cardiovascular: Normal rate, regular rhythm and normal heart sounds.   Pulmonary/Chest: Effort normal. No respiratory distress. He has wheezes. He has no rales. He exhibits no tenderness.  Abdominal: Soft. Bowel sounds are normal. There is no tenderness. There is no rebound and no guarding.  Musculoskeletal: Normal range of motion. He exhibits no edema.  Lymphadenopathy:    He has no cervical adenopathy.  Neurological: He is alert and oriented to person, place, and time.  Skin: Skin is warm and dry. No rash noted.  Psychiatric: He has a normal mood and affect.    ED Course  INTUBATION Date/Time: 05/07/2015 3:55  PM Performed by: Rolan Bucco Authorized by: Rolan Bucco Consent: The procedure was performed in an emergent situation. Indications: respiratory distress and  hypoxemia Intubation method: video-assisted Patient status: paralyzed (RSI) Sedatives: etomidate Paralytic: rocuronium Laryngoscope size: Mac 3 Tube size: 7.5 mm Tube type: cuffed Number of attempts: 2 Ventilation between attempts: BVM Cricoid pressure: yes Cords visualized: yes Post-procedure assessment: chest rise and ETCO2 monitor Breath sounds: equal Cuff inflated: yes ETT to lip: 26 cm Tube secured with: ETT holder Chest x-ray interpreted by me and radiologist. Patient tolerance: Patient tolerated the procedure well with no immediate complications   (including critical care time) Labs Review Labs Reviewed  COMPREHENSIVE METABOLIC PANEL - Abnormal; Notable for the following:    Sodium 134 (*)    Potassium 6.0 (*)    CO2 17 (*)    BUN 61 (*)    Creatinine, Ser 5.33 (*)    Calcium 8.1 (*)  AST 72 (*)    GFR calc non Af Amer 12 (*)    GFR calc Af Amer 14 (*)    All other components within normal limits  CBC WITH DIFFERENTIAL/PLATELET - Abnormal; Notable for the following:    WBC 10.8 (*)    RBC 3.20 (*)    Hemoglobin 10.5 (*)    HCT 31.9 (*)    RDW 15.6 (*)    Neutro Abs 9.6 (*)    All other components within normal limits  URINALYSIS, ROUTINE W REFLEX MICROSCOPIC (NOT AT [facility]) - Abnormal; Notable for the following:    Color, Urine AMBER (*)    APPearance CLOUDY (*)    Hgb urine dipstick LARGE (*)    Protein, ur 30 (*)    All other components within normal limits  URINE MICROSCOPIC-ADD ON - Abnormal; Notable for the following:    Squamous Epithelial / LPF FEW (*)    Bacteria, UA MANY (*)    Casts HYALINE CASTS (*)    All other components within normal limits  I-STAT CG4 LACTIC ACID, ED - Abnormal; Notable for the following:    Lactic Acid, Venous 2.47 (*)    All other components within normal  limits  CULTURE, EXPECTORATED SPUTUM-ASSESSMENT  CULTURE, BLOOD (ROUTINE X 2)  CULTURE, BLOOD (ROUTINE X 2)  URINE CULTURE  CULTURE, RESPIRATORY (NON-EXPECTORATED)  ETHANOL  ACETAMINOPHEN LEVEL  SALICYLATE LEVEL  URINE RAPID DRUG SCREEN, HOSP PERFORMED  BLOOD GAS, ARTERIAL  LEGIONELLA ANTIGEN, URINE  STREP PNEUMONIAE URINARY ANTIGEN  LACTIC ACID, PLASMA  LACTIC ACID, PLASMA  LACTIC ACID, PLASMA  TROPONIN I  TROPONIN I  TROPONIN I  CK  PROCALCITONIN  HIV ANTIBODY (ROUTINE TESTING)  I-STAT TROPOININ, ED  I-STAT CG4 LACTIC ACID, ED    Imaging Review Dg Chest 2 View  05/06/2015   CLINICAL DATA:  Several day history of cough and congestion  EXAM: CHEST  2 VIEW  COMPARISON:  April 08, 2015  FINDINGS: Lungs are clear. Heart size and pulmonary vascularity are normal. No adenopathy. No bone lesions.  IMPRESSION: No edema or consolidation.   Electronically Signed   By: Bretta Bang III M.D.   On: 05/06/2015 07:45   Dg Chest Portable 1 View  05/07/2015   CLINICAL DATA:  Intubated  EXAM: PORTABLE CHEST - 1 VIEW  COMPARISON:  05/07/2015  FINDINGS: Cardiomediastinal silhouette is stable. Endotracheal tube in place with tip 2.5 cm above the carina. Extensive bilateral infiltrates again noted. No pneumothorax.  IMPRESSION: Extensive bilateral infiltrates again noted. Endotracheal tube in place. No pneumothorax.   Electronically Signed   By: Natasha Mead M.D.   On: 05/07/2015 15:45   Dg Chest Port 1 View  05/07/2015   CLINICAL DATA:  Unresponsive, shortness of breath  EXAM: PORTABLE CHEST - 1 VIEW  COMPARISON:  05/06/2015  FINDINGS: New extensive perihilar and mid lung consolidative airspace disease since yesterday, compatible with severe acute pneumonia, aspiration, alveolar edema, or hemorrhage. No effusion or pneumothorax. Trachea is midline. Normal heart size and vascularity.  IMPRESSION: Extensive acute perihilar and mid lung consolidative bilateral airspace process as above.  No effusion  or pneumothorax.   Electronically Signed   By: Judie Petit.  Shick M.D.   On: 05/07/2015 13:43   I have personally reviewed and evaluated these images and lab results as part of my medical decision-making.   EKG Interpretation   Date/Time:  Saturday May 07 2015 12:20:28 EDT Ventricular Rate:  88 PR Interval:  139 QRS Duration: 105  QT Interval:  344 QTC Calculation: 416 R Axis:   106 Text Interpretation:  Sinus rhythm Left atrial enlargement Borderline low  voltage, extremity leads Consider right ventricular hypertrophy since last  tracing no significant change Confirmed by BELFI  MD, MELANIE ([id number]) on  05/07/2015 4:17:05 PM      MDM   Final diagnoses:  Sepsis, due to unspecified organism  HCAP (healthcare-associated pneumonia)  Acute respiratory failure, unspecified whether with hypoxia or hypercapnia    Patient presents with shortness of breath cough and hypotension. He is not febrile but had SIRS criteria for sepsis. He was started on the sepsis protocol and given broad-spectrum antibiotics for healthcare associated pneumonia. As he was further treated in the ED, he had worsening hypoxia. Even on a nonrebreather his sats were in the upper 80s. He was having increased work of breathing. He also developed pain frothy sputum coming out of his mouth and nose. At this point, the decision was made to intubate the patient. Following intubation, his blood pressure which had initially improved dropped again. He was started on a phenylephrine drip. I consulted with critical care, Dr. Delton Coombes who will admit the patient to the ICU.  CRITICAL CARE Performed by: BELFI, MELANIE Total critical care time: 60 Critical care time was exclusive of separately billable procedures and treating other patients. Critical care was necessary to treat or prevent imminent or life-threatening deterioration. Critical care was time spent personally by me on the following activities: development of treatment plan with  patient and/or surrogate as well as nursing, discussions with consultants, evaluation of patient's response to treatment, examination of patient, obtaining history from patient or surrogate, ordering and performing treatments and interventions, ordering and review of laboratory studies, ordering and review of radiographic studies, pulse oximetry and re-evaluation of patient's condition.     Rolan Bucco, MD 05/07/15 1556  Rolan Bucco, MD 05/07/15 [phone]

## 2015-05-07 NOTE — ED Notes (Signed)
At present, what is documented in chart is pt has received 2,040ml NS. 500 bolus infusing KVO.

## 2015-05-07 NOTE — Procedures (Signed)
Arterial Catheter Insertion Procedure Note Eddie Williams [id number] 02-07-1971  Procedure: Insertion of Arterial Catheter  Indications: Blood pressure monitoring  Procedure Details Consent: Unable to obtain consent because of emergent medical necessity. Time Out: Verified patient identification, verified procedure, site/side was marked, verified correct patient position, special equipment/implants available, medications/allergies/relevent history reviewed, required imaging and test results available.  Performed  Maximum sterile technique was used including antiseptics, cap, gloves, gown, hand hygiene, mask and sheet. Skin prep: Chlorhexidine; local anesthetic administered 20 gauge catheter was inserted into right radial artery using the Seldinger technique.  Evaluation Blood flow good; BP tracing good. Complications: No apparent complications.   Berton Bon 05/07/2015

## 2015-05-07 NOTE — Procedures (Signed)
Central Venous Catheter Insertion Procedure Note Eddie Williams [id number] [birth year]  Procedure: Insertion of Central Venous Catheter Indications: Drug and/or fluid administration  Procedure Details Consent: Risks of procedure as well as the alternatives and risks of each were explained to the (patient/caregiver).  Consent for procedure obtained. Time Out: Verified patient identification, verified procedure, site/side was marked, verified correct patient position, special equipment/implants available, medications/allergies/relevent history reviewed, required imaging and test results available.  Performed  Maximum sterile technique was used including antiseptics, cap, gloves, gown, hand hygiene, mask and sheet. Skin prep: Chlorhexidine; local anesthetic administered A antimicrobial bonded/coated triple lumen catheter was placed in the left internal jugular vein using the Seldinger technique.  Evaluation Blood flow good Complications: No apparent complications Patient did tolerate procedure well. Chest X-ray ordered to verify placement.  CXR: pending.   Levy Pupa, MD, PhD 05/07/2015, 4:51 PM [facility] [phone] or if no answer [phone]

## 2015-05-07 NOTE — Progress Notes (Signed)
Pt transported to and from CT without incident.  RT to monitor and assess as needed.  

## 2015-05-07 NOTE — Progress Notes (Signed)
eLink Physician-Brief Progress Note Patient Name: ODEAN FESTER DOB: Jun 14, 1971 MRN: [id number]   Date of Service  05/07/2015  HPI/Events of Note  RN notified of ABG 7.05/75/102 on FiO2 1.0 PEEP 10 & Rate 18 spontaneous TV 550cc.  eICU Interventions  1 amp Bicarb - Increase RR to 30. Repeat ABG 20-30 min.     Intervention Category Major Interventions: Acid-Base disturbance - evaluation and management  Lawanda Cousins 05/07/2015, 6:10 PM

## 2015-05-07 NOTE — ED Notes (Signed)
Plasma lactic acid not drawn, I stat lactic acid just resulted. byrum made aware. byrum wants later plasma lactic acids that are ordered.

## 2015-05-07 NOTE — Progress Notes (Signed)
Per MD okay to leave Pt on 600 VT even though 8cc/kg VT is 565.  RT to monitor and assess as needed.

## 2015-05-07 NOTE — ED Notes (Signed)
PCCM to bedside  

## 2015-05-07 NOTE — H&P (Signed)
PULMONARY / CRITICAL CARE MEDICINE   Name: Eddie Williams MRN: [id number] DOB: 24-Aug-1970    ADMISSION DATE:  05/07/2015  REFERRING MD :  [facility] ED  CHIEF COMPLAINT:  PNA, Acute respiratory distress  INITIAL PRESENTATION: 44 yo man, hx polysubstance abuse, seizures, COPD / asthma, presented with B infiltrates and altered MS 9/17. Possible toxic ingestion of benzos and narcs. Progressed to respiratory failure and required intubation and MV, then hypotension presumed septic shock.   STUDIES:  Head Ct 9/4 >> no acute abnormality CXR 9/16 >> no infiltrates CXR 9/17 >> Bilateral infiltrates and consolidation, perihilar predominant but involving other fields as well  SIGNIFICANT EVENTS: Intubation 9/17  HISTORY OF PRESENT ILLNESS:  44 yo man, hx of polysubstance abuse including Et-OH, tobacco, prescription meds, cocaine, THC. Hx anxiety / depression, chronic back pain, seizure disorder. He was in the ED 9/16 c/o dyspnea and cough, chest discomfort. His CXR was clear at that time, labs showed acute renal insufficiency. He unfortunately left AMA before he could be admitted. He returned on 9/17 with progression of the cough and dyspnea, fever. There were reportedly empty clonazepam and norco brought in with him, ? How many he took. CXR on this presentation was profoundly abnormal, consistent with HCAP + evolving ALI vs other pulm edema. He required intubation. Labs revealed progressive renal failure, associated hyperkalemia. He evolved shock, presumed septic shock. He is admitted to ICU for further management.  PAST MEDICAL HISTORY :   has a past medical history of Anxiety; Chronic back pain; Seizures; COPD (chronic obstructive pulmonary disease); Hypertension; Kidney stones; and Asthma.  has past surgical history that includes Back surgery; Appendectomy; and Ankle surgery. Prior to Admission medications   Medication Sig Start Date End Date Taking? Authorizing Provider  acetaminophen (TYLENOL) 500 MG  tablet Take 1,000 mg by mouth every 6 (six) hours as needed for moderate pain.    Yes Historical Provider, MD  albuterol (PROVENTIL HFA;VENTOLIN HFA) 108 (90 BASE) MCG/ACT inhaler Inhale 2 puffs into the lungs every 6 (six) hours as needed for wheezing or shortness of breath.   Yes Historical Provider, MD  albuterol (PROVENTIL) (2.5 MG/3ML) 0.083% nebulizer solution Take 2.5 mg by nebulization every 6 (six) hours as needed for wheezing or shortness of breath.   Yes Historical Provider, MD  atenolol-chlorthalidone (TENORETIC) 100-25 MG per tablet Take 1 tablet by mouth daily. 04/04/15   Historical Provider, MD  buPROPion (WELLBUTRIN XL) 300 MG 24 hr tablet Take 300 mg by mouth every morning.     Historical Provider, MD  clonazePAM (KLONOPIN) 1 MG tablet Take 1 mg by mouth 2 (two) times daily as needed for anxiety.     Historical Provider, MD  fluticasone (FLONASE) 50 MCG/ACT nasal spray Place 1-2 sprays into both nostrils daily as needed for allergies or rhinitis.     Historical Provider, MD  gabapentin (NEURONTIN) 300 MG capsule Take 300 mg by mouth 3 (three) times daily.    Historical Provider, MD  HYDROcodone-acetaminophen (NORCO) 10-325 MG per tablet Take 1 tablet by mouth every 6 (six) hours as needed for moderate pain.  01/27/15   Historical Provider, MD  levofloxacin (LEVAQUIN) 750 MG tablet Take 1 tablet (750 mg total) by mouth daily. Patient not taking: Reported on 04/24/2015 04/10/15   Jerald Kief, MD  loratadine (CLARITIN) 10 MG tablet Take 10 mg by mouth every morning.     Historical Provider, MD  omeprazole (PRILOSEC) 20 MG capsule Take 20 mg by mouth daily.  Historical Provider, MD  sertraline (ZOLOFT) 100 MG tablet Take 100 mg by mouth every morning.     Historical Provider, MD   Allergies  Allergen Reactions  . Penicillins Anaphylaxis  . Ibuprofen Nausea Only  . Nsaids Nausea And Vomiting  . Erythromycin Rash    FAMILY HISTORY:  indicated that his mother is alive. He indicated  that his father is deceased.  SOCIAL HISTORY:  reports that he has been smoking Cigarettes.  He has been smoking about 0.50 packs per day. He does not have any smokeless tobacco history on file. He reports that he drinks alcohol. He reports that he uses illicit drugs ("Crack" cocaine and Marijuana).  REVIEW OF SYSTEMS:  Unable to obtain, pt sedated  SUBJECTIVE:   VITAL SIGNS: Temp:  [98.1 F (36.7 C)-98.8 F (37.1 C)] 98.8 F (37.1 C) (09/17 1557) Pulse Rate:  [79-103] 83 (09/17 1557) Resp:  [14-26] 14 (09/17 1557) BP: (77-117)/(35-69) 100/57 mmHg (09/17 1557) SpO2:  [52 %-92 %] 92 % (09/17 1554) Weight:  [74.844 kg (165 lb)] 74.844 kg (165 lb) (09/17 1529) HEMODYNAMICS:   VENTILATOR SETTINGS:   INTAKE / OUTPUT: No intake or output data in the 24 hours ending 05/07/15 1606  PHYSICAL EXAMINATION: General:  Ill appearing young man, sedated and intubated Neuro:  Unresponsive to voice or physical stim, pupils 2mm and do not react HEENT:  OP clear ETT in place Cardiovascular: regular, no M Lungs:  Coarse insp BS bilaterally, scattered wheezes.  Abdomen:  Soft, benign, hypoactive BS Musculoskeletal:  No deformities  Skin:  Abrasion R knee, no evidence cellulitis  LABS:  CBC  Recent Labs Lab 05/06/15 0819 05/07/15 1250  WBC 16.6* 10.8*  HGB 11.8* 10.5*  HCT 35.1* 31.9*  PLT 210 188   Coag's No results for input(s): APTT, INR in the last 168 hours. BMET  Recent Labs Lab 05/06/15 0819 05/07/15 1250  NA 133* 134*  K 4.3 6.0*  CL 98* 102  CO2 26 17*  BUN 31* 61*  CREATININE 1.77* 5.33*  GLUCOSE 97 98   Electrolytes  Recent Labs Lab 05/06/15 0819 05/07/15 1250  CALCIUM 9.2 8.1*   Sepsis Markers  Recent Labs Lab 05/06/15 0819 05/07/15 1300  LATICACIDVEN 1.4 2.47*   ABG No results for input(s): PHART, PCO2ART, PO2ART in the last 168 hours. Liver Enzymes  Recent Labs Lab 05/07/15 1250  AST 72*  ALT 18  ALKPHOS 57  BILITOT 0.5  ALBUMIN 3.6    Cardiac Enzymes  Recent Labs Lab 05/06/15 0819  TROPONINI <0.03   Glucose No results for input(s): GLUCAP in the last 168 hours.  Imaging Dg Chest Portable 1 View  05/07/2015   CLINICAL DATA:  Intubated  EXAM: PORTABLE CHEST - 1 VIEW  COMPARISON:  05/07/2015  FINDINGS: Cardiomediastinal silhouette is stable. Endotracheal tube in place with tip 2.5 cm above the carina. Extensive bilateral infiltrates again noted. No pneumothorax.  IMPRESSION: Extensive bilateral infiltrates again noted. Endotracheal tube in place. No pneumothorax.   Electronically Signed   By: Natasha Mead M.D.   On: 05/07/2015 15:45   Dg Chest Port 1 View  05/07/2015   CLINICAL DATA:  Unresponsive, shortness of breath  EXAM: PORTABLE CHEST - 1 VIEW  COMPARISON:  05/06/2015  FINDINGS: New extensive perihilar and mid lung consolidative airspace disease since yesterday, compatible with severe acute pneumonia, aspiration, alveolar edema, or hemorrhage. No effusion or pneumothorax. Trachea is midline. Normal heart size and vascularity.  IMPRESSION: Extensive acute perihilar and mid lung  consolidative bilateral airspace process as above.  No effusion or pneumothorax.   Electronically Signed   By: Judie Petit.  Shick M.D.   On: 05/07/2015 13:43     ASSESSMENT / PLAN:  PULMONARY ETT 9/17 >>  A:Acute respiratory failure HCAP vs aspiration PNA, bilateral infiltrates ARDS P:   - MV with low VT protocol, 6cc/kg - increase PEEP to decrease FiO2, achieve SpO2 > 90 - follow ABG this pm - abx as below   CARDIOVASCULAR CVL L IJ CVC 9/17 >>  A: Shock, presumed septic, consider cardiogenic P:  - CVC has been placed, will assess CVP to help guide fluid management - check SvO2 x 1 - transition phenylephrine to norepi - consider starting vasopressin if norepi dose 10-15 - stress dose steroids - check TTE in am (consider this pm if CVP high) - trend lactate for clearance  RENAL A:  Acute renal failure, presumed ATN, consider possible  effects of NSAIDs Hyperkalemia  AG metabolic acidosis, suspect lactic P:   - follow BMP this pm - low threshold to involve Renal Service early  GASTROINTESTINAL A:  SUP prophylaxis P:   - H 2 blockade   HEMATOLOGIC A:  Mild anemia Mild thrombocytopenia P:  - follow CBC - DIC panel if plt continue to drop  INFECTIOUS A:  HCAP vs aspiration PNA P:   BCx2 9/17 >>  UC 9/17 >>  Resp 9/17 >>   Aztreonam 9/17 >>  vanco 9/17 >>   ENDOCRINE A:  At risk hyperglycemia due to critical illness and hydrocort   P:   - start ICU SSI protocol  NEUROLOGIC A:  Acute encephalopathy, toxic - metabolic + possible substances Sedation for MV Cocaine / narcs / benzo abuse EtOH abuse P:   RASS goal: -2  Fentanyl gtt + versed pushes Low threshold to change to propofol gtt   FAMILY  - Updates: no family present in ED  - Inter-disciplinary family meet or Palliative Care meeting due by: 05/14/15    TODAY'S SUMMARY:  HCAP with VDRF and septic shock, acute renal failure.    Independent CC time 90 minutes   Levy Pupa, MD, PhD 05/07/2015, 5:11 PM [facility] [phone] or if no answer [phone]

## 2015-05-07 NOTE — Progress Notes (Signed)
ANTIBIOTIC CONSULT NOTE - INITIAL  Pharmacy Consult for Vancomycin and Aztreonam Indication: HCAP, Sepsis  Allergies  Allergen Reactions  . Penicillins Anaphylaxis  . Ibuprofen Nausea Only  . Nsaids Nausea And Vomiting  . Erythromycin Rash    Patient Measurements:     Vital Signs: Temp: 98.1 F (36.7 C) (09/17 1349) Temp Source: Rectal (09/17 1349) BP: 85/52 mmHg (09/17 1420) Pulse Rate: 83 (09/17 1420) Intake/Output from previous day:   Intake/Output from this shift:    Labs:  Recent Labs  05/06/15 0819 05/07/15 1250  WBC 16.6* 10.8*  HGB 11.8* 10.5*  PLT 210 188  CREATININE 1.77* 5.33*   CrCl cannot be calculated (Unknown ideal weight.). No results for input(s): VANCOTROUGH, VANCOPEAK, VANCORANDOM, GENTTROUGH, GENTPEAK, GENTRANDOM, TOBRATROUGH, TOBRAPEAK, TOBRARND, AMIKACINPEAK, AMIKACINTROU, AMIKACIN in the last 72 hours.   Microbiology: Recent Results (from the past 720 hour(s))  Blood culture (routine x 2)     Status: None   Collection Time: 04/08/15  3:16 AM  Result Value Ref Range Status   Specimen Description BLOOD LEFT HAND  Final   Special Requests BOTTLES DRAWN AEROBIC AND ANAEROBIC 3CC  Final   Culture   Final    NO GROWTH 5 DAYS Performed at [facility]    Report Status 04/13/2015 FINAL  Final  Blood culture (routine x 2)     Status: None   Collection Time: 04/08/15  3:16 AM  Result Value Ref Range Status   Specimen Description BLOOD LEFT WRIST  Final   Special Requests BOTTLES DRAWN AEROBIC AND ANAEROBIC 3CC  Final   Culture  Setup Time   Final    GRAM POSITIVE COCCI IN CLUSTERS AEROBIC BOTTLE ONLY CRITICAL RESULT CALLED TO, READ BACK BY AND VERIFIED WITH: IGabriel Rainwater AT 0816 ON [id number] BY Lucienne Capers    Culture   Final    STAPHYLOCOCCUS SPECIES (COAGULASE NEGATIVE) THE SIGNIFICANCE OF ISOLATING THIS ORGANISM FROM A SINGLE SET OF BLOOD CULTURES WHEN MULTIPLE SETS ARE DRAWN IS UNCERTAIN. PLEASE NOTIFY THE MICROBIOLOGY  DEPARTMENT WITHIN ONE WEEK IF SPECIATION AND SENSITIVITIES ARE REQUIRED. Performed at [facility]    Report Status 04/11/2015 FINAL  Final  MRSA PCR Screening     Status: None   Collection Time: 04/08/15  3:50 AM  Result Value Ref Range Status   MRSA by PCR NEGATIVE NEGATIVE Final    Comment:        The GeneXpert MRSA Assay (FDA approved for NASAL specimens only), is one component of a comprehensive MRSA colonization surveillance program. It is not intended to diagnose MRSA infection nor to guide or monitor treatment for MRSA infections.   Culture, sputum-assessment     Status: None   Collection Time: 04/08/15  9:43 PM  Result Value Ref Range Status   Specimen Description Expect. Sput  Final   Special Requests NONE  Final   Sputum evaluation   Final    THIS SPECIMEN IS ACCEPTABLE. RESPIRATORY CULTURE REPORT TO FOLLOW.   Report Status 04/08/2015 FINAL  Final  Culture, respiratory (NON-Expectorated)     Status: None   Collection Time: 04/08/15  9:43 PM  Result Value Ref Range Status   Specimen Description SPUTUM  Final   Special Requests NONE  Final   Gram Stain   Final    ABUNDANT WBC PRESENT, PREDOMINANTLY PMN FEW SQUAMOUS EPITHELIAL CELLS PRESENT MODERATE GRAM POSITIVE RODS FEW YEAST FEW GRAM NEGATIVE RODS    Culture   Final    NORMAL OROPHARYNGEAL FLORA  Performed at [facility]    Report Status 04/11/2015 FINAL  Final  Culture, blood (routine x 2)     Status: None   Collection Time: 04/10/15  9:22 AM  Result Value Ref Range Status   Specimen Description BLOOD LEFT ARM  Final   Special Requests BOTTLES DRAWN AEROBIC AND ANAEROBIC 10CC  Final   Culture   Final    NO GROWTH 5 DAYS Performed at [facility]    Report Status 04/15/2015 FINAL  Final  Culture, blood (routine x 2)     Status: None   Collection Time: 04/10/15  9:32 AM  Result Value Ref Range Status   Specimen Description BLOOD RIGHT ARM  Final   Special Requests BOTTLES DRAWN  AEROBIC ONLY 7CC  Final   Culture   Final    NO GROWTH 5 DAYS Performed at [facility]    Report Status 04/15/2015 FINAL  Final  Culture, sputum-assessment     Status: None   Collection Time: 05/07/15  2:17 PM  Result Value Ref Range Status   Specimen Description SPUTUM  Final   Special Requests Immunocompromised  Final   Sputum evaluation   Final    THIS SPECIMEN IS ACCEPTABLE. RESPIRATORY CULTURE REPORT TO FOLLOW.   Report Status 05/07/2015 FINAL  Final    Medical History: Past Medical History  Diagnosis Date  . Anxiety   . Chronic back pain   . Seizures   . COPD (chronic obstructive pulmonary disease)   . Hypertension   . Kidney stones   . Asthma     Medications:  Anti-infectives    Start     Dose/Rate Route Frequency Ordered Stop   05/07/15 1400  aztreonam (AZACTAM) 2 g in dextrose 5 % 50 mL IVPB     2 g 100 mL/hr over 30 Minutes Intravenous  Once 05/07/15 1354 05/07/15 1444   05/07/15 1400  vancomycin (VANCOCIN) IVPB 1000 mg/200 mL premix     1,000 mg 200 mL/hr over 60 Minutes Intravenous  Once 05/07/15 1354       Assessment: 44yo M w/ shortness of breath and wheezing. Recent admission and ED visits with similar symptoms. Pharmacy is asked to dose Vanc and Aztreonam for suspected HCAP. First doses given in the ED. Weight was 76kg one month ago. SCr 5.33 on admission. Over the past three months SCr has ranged from 0.88 to 5.63. CrCl ~18.  Goal of Therapy:  Vancomycin trough level 15-20 mcg/ml  Appropriate antibiotic dosing for renal function; eradication of infection  Plan:  No further Vancomycin for now. We will follow up SCr and decide on further doses tomorrow. Start Aztreonam 500mg  IV q8h. Measure Vanc trough at steady state. Follow up renal fxn, culture results, and clinical course.  Charolotte Eke, PharmD, pager [phone]. 05/07/2015,3:00 PM.

## 2015-05-07 NOTE — ED Notes (Addendum)
Critical care paged,charge spoke with dr Celene Skeen. Ventilator respiration rate to be increased to 30, and abg drawn 20 min later. md made aware pt is bucking tube, and has been given 2 prn doses of versed, told to keep given prn doses as needed  Page critical care with ABG results.   RT has been called

## 2015-05-07 NOTE — ED Notes (Signed)
Bed: ZO10 Expected date: 05/07/15 Expected time:  Means of arrival:  Comments: shob

## 2015-05-07 NOTE — Progress Notes (Signed)
eLink Physician-Brief Progress Note Patient Name: Eddie Williams DOB: July 01, 1971 MRN: [id number]   Date of Service  05/07/2015  HPI/Events of Note  RN notified of acidosis 7.16/54.6/78.9. Serum K 7.1.  eICU Interventions  Nurse notified of Kayexalate. Order placed for arterial line - repeat ABG 2000 on bicarb gtt @ 100cc/hr. Repeat BMP midnight.     Intervention Category Major Interventions: Acid-Base disturbance - evaluation and management  Lawanda Cousins 05/07/2015, 7:31 PM

## 2015-05-07 NOTE — Progress Notes (Signed)
Pt transported to ICU from ED with Vent.

## 2015-05-07 NOTE — Progress Notes (Signed)
eLink Physician-Brief Progress Note Patient Name: Eddie Williams DOB: 1971/03/06 MRN: [id number]   Date of Service  05/07/2015  HPI/Events of Note  RN notified patient with new altered mentation/somnolence & twitching. Camera Check w/ patient pupils dramatically different.   eICU Interventions  Stat EEG & CT head w/o. Propofol gtt.     Intervention Category Major Interventions: Change in mental status - evaluation and management  Lawanda Cousins 05/07/2015, 10:07 PM

## 2015-05-08 ENCOUNTER — Inpatient Hospital Stay (HOSPITAL_COMMUNITY): Payer: Medicare Other

## 2015-05-08 DIAGNOSIS — R55 Syncope and collapse: Secondary | ICD-10-CM

## 2015-05-08 DIAGNOSIS — J9602 Acute respiratory failure with hypercapnia: Secondary | ICD-10-CM

## 2015-05-08 DIAGNOSIS — M6282 Rhabdomyolysis: Secondary | ICD-10-CM

## 2015-05-08 LAB — CBC WITH DIFFERENTIAL/PLATELET
BASOS ABS: 0 10*3/uL (ref 0.0–0.1)
Basophils Relative: 0 %
EOS ABS: 0 10*3/uL (ref 0.0–0.7)
Eosinophils Relative: 0 %
HEMATOCRIT: 31 % — AB (ref 39.0–52.0)
HEMOGLOBIN: 10.6 g/dL — AB (ref 13.0–17.0)
LYMPHS PCT: 6 %
Lymphs Abs: 0.5 10*3/uL — ABNORMAL LOW (ref 0.7–4.0)
MCH: 33.5 pg (ref 26.0–34.0)
MCHC: 34.2 g/dL (ref 30.0–36.0)
MCV: 98.1 fL (ref 78.0–100.0)
MONOS PCT: 2 %
Monocytes Absolute: 0.2 10*3/uL (ref 0.1–1.0)
NEUTROS ABS: 7.4 10*3/uL (ref 1.7–7.7)
Neutrophils Relative %: 92 %
Platelets: 161 10*3/uL (ref 150–400)
RBC: 3.16 MIL/uL — AB (ref 4.22–5.81)
RDW: 15.5 % (ref 11.5–15.5)
WBC Morphology: INCREASED
WBC: 8.1 10*3/uL (ref 4.0–10.5)

## 2015-05-08 LAB — COMPREHENSIVE METABOLIC PANEL
ALK PHOS: 53 U/L (ref 38–126)
ALT: 22 U/L (ref 17–63)
ANION GAP: 14 (ref 5–15)
AST: 67 U/L — ABNORMAL HIGH (ref 15–41)
Albumin: 3 g/dL — ABNORMAL LOW (ref 3.5–5.0)
BILIRUBIN TOTAL: 0.5 mg/dL (ref 0.3–1.2)
BUN: 70 mg/dL — ABNORMAL HIGH (ref 6–20)
CALCIUM: 7.1 mg/dL — AB (ref 8.9–10.3)
CO2: 21 mmol/L — ABNORMAL LOW (ref 22–32)
Chloride: 101 mmol/L (ref 101–111)
Creatinine, Ser: 4.43 mg/dL — ABNORMAL HIGH (ref 0.61–1.24)
GFR, EST AFRICAN AMERICAN: 17 mL/min — AB (ref >60)
GFR, EST NON AFRICAN AMERICAN: 15 mL/min — AB (ref >60)
Glucose, Bld: 133 mg/dL — ABNORMAL HIGH (ref 65–99)
POTASSIUM: 4.4 mmol/L (ref 3.5–5.1)
Sodium: 136 mmol/L (ref 135–145)
TOTAL PROTEIN: 5.8 g/dL — AB (ref 6.5–8.1)

## 2015-05-08 LAB — BLOOD GAS, ARTERIAL
ACID-BASE DEFICIT: 3.3 mmol/L — AB (ref 0.0–2.0)
ACID-BASE DEFICIT: 2.9 mmol/L — AB (ref 0.0–2.0)
Acid-base deficit: 4.2 mmol/L — ABNORMAL HIGH (ref 0.0–2.0)
BICARBONATE: 19.1 meq/L — AB (ref 20.0–24.0)
BICARBONATE: 20.4 meq/L (ref 20.0–24.0)
BICARBONATE: 21.3 meq/L (ref 20.0–24.0)
Drawn by: 308601
Drawn by: 308601
Drawn by: 308601
FIO2: 0.5
FIO2: 0.5
FIO2: 0.5
LHR: 35 {breaths}/min
LHR: 35 {breaths}/min
LHR: 35 {breaths}/min
MECHVT: 600 mL
MECHVT: 430 mL
O2 SAT: 93.3 %
O2 Saturation: 94.7 %
O2 Saturation: 94.7 %
PATIENT TEMPERATURE: 37.7
PEEP/CPAP: 10 cmH2O
PEEP/CPAP: 10 cmH2O
PEEP/CPAP: 8 cmH2O
PH ART: 7.396 (ref 7.350–7.450)
PH ART: 7.368 (ref 7.350–7.450)
PO2 ART: 75.2 mmHg — AB (ref 80.0–100.0)
Patient temperature: 37.7
Patient temperature: 37.3
TCO2: 17.8 mmol/L (ref 0–100)
TCO2: 19.1 mmol/L (ref 0–100)
TCO2: 19.8 mmol/L (ref 0–100)
VT: 500 mL
pCO2 arterial: 31.1 mmHg — ABNORMAL LOW (ref 35.0–45.0)
pCO2 arterial: 34.1 mmHg — ABNORMAL LOW (ref 35.0–45.0)
pCO2 arterial: 38.2 mmHg (ref 35.0–45.0)
pH, Arterial: 7.409 (ref 7.350–7.450)
pO2, Arterial: 76.5 mmHg — ABNORMAL LOW (ref 80.0–100.0)
pO2, Arterial: 77.1 mmHg — ABNORMAL LOW (ref 80.0–100.0)

## 2015-05-08 LAB — BASIC METABOLIC PANEL
ANION GAP: 14 (ref 5–15)
BUN: 64 mg/dL — ABNORMAL HIGH (ref 6–20)
CHLORIDE: 101 mmol/L (ref 101–111)
CO2: 21 mmol/L — AB (ref 22–32)
Calcium: 7.4 mg/dL — ABNORMAL LOW (ref 8.9–10.3)
Creatinine, Ser: 4.78 mg/dL — ABNORMAL HIGH (ref 0.61–1.24)
GFR calc non Af Amer: 14 mL/min — ABNORMAL LOW (ref >60)
GFR, EST AFRICAN AMERICAN: 16 mL/min — AB (ref >60)
GLUCOSE: 130 mg/dL — AB (ref 65–99)
POTASSIUM: 5 mmol/L (ref 3.5–5.1)
Sodium: 136 mmol/L (ref 135–145)

## 2015-05-08 LAB — GLUCOSE, CAPILLARY
GLUCOSE-CAPILLARY: 97 mg/dL (ref 65–99)
Glucose-Capillary: 111 mg/dL — ABNORMAL HIGH (ref 65–99)
Glucose-Capillary: 113 mg/dL — ABNORMAL HIGH (ref 65–99)
Glucose-Capillary: 114 mg/dL — ABNORMAL HIGH (ref 65–99)
Glucose-Capillary: 125 mg/dL — ABNORMAL HIGH (ref 65–99)
Glucose-Capillary: 132 mg/dL — ABNORMAL HIGH (ref 65–99)

## 2015-05-08 LAB — LACTIC ACID, PLASMA
LACTIC ACID, VENOUS: 3.4 mmol/L — AB (ref 0.5–2.0)
Lactic Acid, Venous: 2.3 mmol/L (ref 0.5–2.0)
Lactic Acid, Venous: 2.4 mmol/L (ref 0.5–2.0)

## 2015-05-08 LAB — HIV ANTIBODY (ROUTINE TESTING W REFLEX): HIV Screen 4th Generation wRfx: NONREACTIVE

## 2015-05-08 LAB — MAGNESIUM: MAGNESIUM: 1.6 mg/dL — AB (ref 1.7–2.4)

## 2015-05-08 LAB — PROCALCITONIN: PROCALCITONIN: 19.43 ng/mL

## 2015-05-08 LAB — TROPONIN I: Troponin I: <0.03 ng/mL (ref <0.031)

## 2015-05-08 LAB — TRIGLYCERIDES: Triglycerides: 103 mg/dL (ref <150)

## 2015-05-08 LAB — PHOSPHORUS: PHOSPHORUS: 3.6 mg/dL (ref 2.5–4.6)

## 2015-05-08 LAB — VANCOMYCIN, RANDOM: VANCOMYCIN RM: 8 ug/mL

## 2015-05-08 LAB — ACETAMINOPHEN LEVEL: Acetaminophen (Tylenol), Serum: <10 ug/mL — ABNORMAL LOW (ref 10–30)

## 2015-05-08 LAB — CK: CK TOTAL: 1412 U/L — AB (ref 49–397)

## 2015-05-08 MED ORDER — SODIUM CHLORIDE 0.9 % IV SOLN
25.0000 ug/h | INTRAVENOUS | Status: DC
  Administered 2015-05-08: 50 ug/h via INTRAVENOUS
  Administered 2015-05-08: 100 ug/h via INTRAVENOUS
  Administered 2015-05-09: 350 ug/h via INTRAVENOUS
  Administered 2015-05-09 (×2): 50 ug/h via INTRAVENOUS
  Administered 2015-05-09 – 2015-05-10 (×3): 400 ug/h via INTRAVENOUS
  Administered 2015-05-10: 50 ug/h via INTRAVENOUS
  Administered 2015-05-11 – 2015-05-12 (×5): 400 ug/h via INTRAVENOUS
  Filled 2015-05-08 (×11): qty 50

## 2015-05-08 MED ORDER — MAGNESIUM SULFATE IN D5W 10-5 MG/ML-% IV SOLN
1.0000 g | Freq: Once | INTRAVENOUS | Status: AC
  Administered 2015-05-08: 1 g via INTRAVENOUS
  Filled 2015-05-08: qty 100

## 2015-05-08 MED ORDER — LORAZEPAM 2 MG/ML IJ SOLN: 1.0000 mg | INTRAMUSCULAR | Status: DC | PRN

## 2015-05-08 MED ORDER — PNEUMOCOCCAL VAC POLYVALENT 25 MCG/0.5ML IJ INJ
0.5000 mL | INJECTION | INTRAMUSCULAR | Status: AC
  Administered 2015-05-09: 0.5 mL via INTRAMUSCULAR
  Filled 2015-05-08 (×2): qty 0.5

## 2015-05-08 MED ORDER — VANCOMYCIN HCL 10 G IV SOLR
1250.0000 mg | Freq: Once | INTRAVENOUS | Status: AC
  Administered 2015-05-08: 1250 mg via INTRAVENOUS
  Filled 2015-05-08: qty 1250

## 2015-05-08 MED ORDER — INFLUENZA VAC SPLIT QUAD 0.5 ML IM SUSY
0.5000 mL | PREFILLED_SYRINGE | INTRAMUSCULAR | Status: AC
  Administered 2015-05-09: 0.5 mL via INTRAMUSCULAR
  Filled 2015-05-08 (×2): qty 0.5

## 2015-05-08 MED ORDER — IPRATROPIUM-ALBUTEROL 0.5-2.5 (3) MG/3ML IN SOLN
3.0000 mL | RESPIRATORY_TRACT | Status: DC
  Administered 2015-05-08 – 2015-05-09 (×7): 3 mL via RESPIRATORY_TRACT
  Filled 2015-05-08 (×7): qty 3

## 2015-05-08 NOTE — Progress Notes (Signed)
Initial Nutrition Assessment  DOCUMENTATION CODES:   Non-severe (moderate) malnutrition in context of chronic illness  INTERVENTION:   If expected to remain intubated >48 hours, recommend nutrition support.  TF recommendations: Initiate Vital HP @ 15 ml/hr and increase by 10 ml every 4 hours to goal rate of 55 ml/hr.   Tube feeding regimen + current Propofol infusion provides 1882 kcal (92% of needs), 115 grams of protein, and 1104 ml of H2O.   RD to continue to monitor for plan  NUTRITION DIAGNOSIS:   Inadequate oral intake related to inability to eat as evidenced by NPO status.  GOAL:   Patient will meet greater than or equal to 90% of their needs  MONITOR:   Vent status, Labs, Weight trends, Skin, I & O's  REASON FOR ASSESSMENT:   Consult  (Assessment for tube feeding recommendations)  ASSESSMENT:   44 yo man, hx polysubstance abuse, seizures, COPD / asthma, presented with B infiltrates and altered MS 9/17. Possible toxic ingestion of benzos and narcs. Progressed to respiratory failure and required intubation and MV, then hypotension presumed septic shock.   Per family at bedside, pt ate well PTA but did have some recent weight loss. Pt at one time was down to 145 lb but gained weight to 180 lb. Now he is 165 lb.   Patient is currently intubated on ventilator support MV: 15.4 L/min Temp (24hrs), Avg:99.3 F (37.4 C), Min:98.1 F (36.7 C), Max:100.8 F (38.2 C)  Propofol: 21.3 ml/hr -> provides 562 fat kcal  Nutrition-Focused physical exam completed. Findings are mild fat depletion, mild muscle depletion, and no edema.   Labs reviewed: Elevated BUN & Creatinine Low Mg Phos WNL  Diet Order:     Skin:  Wound (see comment) (knee and elbow wound)  Last BM:  PTA  Height:   Ht Readings from Last 1 Encounters:  05/07/15  (1.753 m)    Weight:   Wt Readings from Last 1 Encounters:  05/07/15 165 lb (74.844 kg)    Ideal Body Weight:  72.7  kg  BMI:  Body mass index is 24.36 kg/(m^2).  Estimated Nutritional Needs:   Kcal:  2049  Protein:  110-120g  Fluid:  2L/day  EDUCATION NEEDS:   No education needs identified at this time  Tilda Franco, MS, RD, LDN Pager: [phone] After Hours Pager: [phone]

## 2015-05-08 NOTE — Progress Notes (Addendum)
ANTIBIOTIC CONSULT NOTE - Follow up  Pharmacy Consult for Vancomycin and Aztreonam Indication: HCAP, Sepsis  Allergies  Allergen Reactions  . Penicillins Anaphylaxis  . Ibuprofen Nausea Only  . Nsaids Nausea And Vomiting  . Erythromycin Rash    Patient Measurements: Height:  (175.3 cm) Weight: 165 lb (74.844 kg) IBW/kg (Calculated) : 70.7  Vital Signs: Temp: 100 F (37.8 C) (09/18 0700) Temp Source: Core (Comment) (09/18 0000) BP: 87/55 mmHg (09/18 0715) Pulse Rate: 72 (09/18 0853) Intake/Output from previous day: 09/17 0701 - 09/18 0700 In: 5178.5 [I.V.:5078.5; IV Piggyback:100] Out: 1905 [Urine:1905]  Labs:  Recent Labs  05/06/15 0819 05/07/15 1250 05/07/15 1705 05/08/15 05/08/15 0230  WBC 16.6* 10.8*  --   --  8.1  HGB 11.8* 10.5*  --   --  10.6*  PLT 210 188  --   --  161  CREATININE 1.77* 5.33* 4.61* 4.78* 4.43*   Estimated Creatinine Clearance: 21.5 mL/min (by C-G formula based on Cr of 4.43). No results for input(s): VANCOTROUGH, VANCOPEAK, VANCORANDOM, GENTTROUGH, GENTPEAK, GENTRANDOM, TOBRATROUGH, TOBRAPEAK, TOBRARND, AMIKACINPEAK, AMIKACINTROU, AMIKACIN in the last 72 hours.   Assessment: Eddie Williams presents to ED 9/17 w/ shortness of breath and wheezing. Recent admission and ED visits with similar symptoms, but left AMA on 9/16.  He progressed to respiratory failure, hypotension, and required emergent intubation in ED.  CXR now consistent with HCAP and pt is admitted with septic shock.  Pharmacy is consulted to dose Vanc and Aztreonam.  9/17 >> Aztreonam >> 9/17 >> Vanc >>   Today, 05/08/2015:  Tm 100.8  WBC remain WNL  SCr 4.43 (slightly improved) with CrCl ~ 21 ml/min  Lactic acid increased to 3.4  PCT increased to 19.4  Cultures are in process  Goal of Therapy:  Vancomycin trough level 15-20 mcg/ml  Appropriate antibiotic dosing for renal function; eradication of infection  Plan:   Continue Aztreonam  IV q8h.  Vanc 1g x1  dose on 9/17, no further doses at this time  Vancomycin random level - pharmacy to redose vanc based on levels and SCr.    Follow up renal fxn, culture results, and clinical course.  Lynann Beaver PharmD, BCPS Pager [phone] 05/08/2015 9:59 AM    Addendum:  Vancomycin random level (24hr after first dose) = 8  Vancomycin  IV once 9/18, pharmacy to re-enter further vanc doses based on levels.  Recheck 24 hour VR level on 9/19.  Lynann Beaver PharmD, BCPS Pager [phone] 05/08/2015 3:08 PM

## 2015-05-08 NOTE — Progress Notes (Addendum)
PULMONARY / CRITICAL CARE MEDICINE   Name: Eddie Williams MRN: [id number] DOB: 10-05-1970    ADMISSION DATE:  05/07/2015  REFERRING MD :  [facility] ED  CHIEF COMPLAINT:  PNA, Acute respiratory distress  INITIAL PRESENTATION: 44 yo man, hx polysubstance abuse, seizures, COPD / asthma, presented with B infiltrates and altered MS 9/17. Possible toxic ingestion of benzos and narcs. Progressed to respiratory failure and required intubation and MV, then hypotension presumed septic shock.   STUDIES:  CXR 9/16 - no infiltrates CXR 9/17 - Bilateral infiltrates and consolidation, perihilar predominant but involving other fields as well CT Head 9/17 - no acute intracranial abnormality. EEG 9/18 - Pending  SIGNIFICANT EVENTS: 9/17 - Intubation & Admission 9/17 - Worsening mental status & questionable seizure activity  SUBJECTIVE: Patient initially yesterday evening was writing to his nurse and interacting. He experienced worsening mentation and was transitioned to a propofol drip for potential seizure suppression. CT scan of the head was negative for intracranial abnormality. EEG which was ordered stat was unable to be performed overnight and is pending for today.   ROS: Unobtainable as patient is currently intubated and sedated.  VITAL SIGNS: Temp:  [98.1 F (36.7 C)-100.8 F (38.2 C)] 99 F (37.2 C) (09/18 0900) Pulse Rate:  [57-103] 74 (09/18 0900) Resp:  [0-35] 35 (09/18 0900) BP: (77-173)/(35-154) 93/60 mmHg (09/18 0900) SpO2:  [52 %-100 %] 90 % (09/18 0900) Arterial Line BP: (72-137)/(47-111) 86/65 mmHg (09/18 0500) FiO2 (%):  [40 %-100 %] 40 % (09/18 0853) Weight:  [74.844 kg (165 lb)] 74.844 kg (165 lb) (09/17 1529) HEMODYNAMICS: CVP:  [10 mmHg-11 mmHg] 10 mmHg VENTILATOR SETTINGS: Vent Mode:  [-] PRVC FiO2 (%):  [40 %-100 %] 40 % Set Rate:  [14 bmp-35 bmp] 35 bmp Vt Set:  [430 mL-600 mL] 430 mL PEEP:  [8 cmH20-10 cmH20] 8 cmH20 Plateau Pressure:  [20 cmH20-29 cmH20] 20  cmH20 INTAKE / OUTPUT:  Intake/Output Summary (Last 24 hours) at 05/08/15 1018 Last data filed at 05/08/15 1015  Gross per 24 hour  Intake 6318.96 ml  Output   2255 ml  Net 4063.96 ml    PHYSICAL EXAMINATION: General:  Sedated. Eyes flutter to voice. No acute distress. Integument:  Warm & dry. No rash on exposed skin. Marland Kitchen HEENT:  No scleral injection or icterus. Endotracheal tube in place.  Cardiovascular:  Regular rate & rhythm. No edema.  Pulmonary:  Coarse breath sounds bilaterally . Symmetric chest wall rise on ventilator. Abdomen: Soft. Normal bowel sounds. Nondistended.  Neurological:  Does not withdrawal to pain. Sedate. Right pupil 2mm & reactive. Left pupil 4-71mm & reactive.  LABS:  CBC  Recent Labs Lab 05/06/15 0819 05/07/15 1250 05/08/15 0230  WBC 16.6* 10.8* 8.1  HGB 11.8* 10.5* 10.6*  HCT 35.1* 31.9* 31.0*  PLT 210 188 161   Coag's No results for input(s): APTT, INR in the last 168 hours. BMET  Recent Labs Lab 05/07/15 1705 05/08/15 05/08/15 0230  NA 133* 136 136  K 7.1* 5.0 4.4  CL 103 101 101  CO2 20* 21* 21*  BUN 60* 64* 70*  CREATININE 4.61* 4.78* 4.43*  GLUCOSE 141* 130* 133*   Electrolytes  Recent Labs Lab 05/07/15 1705 05/08/15 05/08/15 0230  CALCIUM 7.4* 7.4* 7.1*  MG  --   --  1.6*  PHOS  --   --  3.6   Sepsis Markers  Recent Labs Lab 05/07/15 1704 05/07/15 1705 05/08/15 0230  LATICACIDVEN 1.73 2.1* 3.4*  PROCALCITON  --  16.09 19.43   ABG  Recent Labs Lab 05/08/15 0410 05/08/15 0520 05/08/15 0618  PHART 7.409 7.396 7.368  PCO2ART 31.1* 34.1* 38.2  PO2ART 76.5* 77.1* 75.2*   Liver Enzymes  Recent Labs Lab 05/07/15 1250 05/08/15 0230  AST 72* 67*  ALT 18 22  ALKPHOS 57 53  BILITOT 0.5 0.5  ALBUMIN 3.6 3.0*   Cardiac Enzymes  Recent Labs Lab 05/07/15 1705 05/07/15 1706 05/08/15 0200  TROPONINI <0.03 <0.03 <0.03   Glucose  Recent Labs Lab 05/07/15 2018 05/07/15 2344 05/08/15 0351  05/08/15 0744  GLUCAP 129* 132* 97 125*    Imaging Dg Abd 1 View  05/07/2015   CLINICAL DATA:  Encounter for orogastric tube placement.  EXAM: ABDOMEN - 1 VIEW  COMPARISON:  None.  FINDINGS: Orogastric tube passes below the diaphragm into the mid to distal stomach.  Unremarkable bowel gas pattern.  IMPRESSION: Orogastric tube is well positioned with the tip in the mid to distal stomach.   Electronically Signed   By: Amie Portland M.D.   On: 05/07/2015 21:32   Ct Head Wo Contrast  05/08/2015   CLINICAL DATA:  Altered mental status.  EXAM: CT HEAD WITHOUT CONTRAST  TECHNIQUE: Contiguous axial images were obtained from the base of the skull through the vertex without intravenous contrast.  COMPARISON:  04/24/2015  FINDINGS: No intracranial hemorrhage, mass effect, or midline shift. No hydrocephalus. The basilar cisterns are patent. No evidence of territorial infarct. No intracranial fluid collection. Calvarium is intact. Scattered mucosal thickening of the ethmoid air cells. No fluid levels. Mastoid air cells are clear.  IMPRESSION: No acute intracranial abnormality.   Electronically Signed   By: Rubye Oaks M.D.   On: 05/08/2015 00:04   Dg Chest Portable 1 View  05/07/2015   CLINICAL DATA:  Central line placement  EXAM: PORTABLE CHEST - 1 VIEW  COMPARISON:  Prior film same day  FINDINGS: Bilateral extensive infiltrates again noted. Stable endotracheal tube position. There is left IJ central line with tip in SVC. No pneumothorax.  IMPRESSION: Extensive bilateral infiltrates again noted. Stable endotracheal tube position. Left IJ central line with tip in SVC. No pneumothorax.   Electronically Signed   By: Natasha Mead M.D.   On: 05/07/2015 17:10   Dg Chest Portable 1 View  05/07/2015   CLINICAL DATA:  Intubated  EXAM: PORTABLE CHEST - 1 VIEW  COMPARISON:  05/07/2015  FINDINGS: Cardiomediastinal silhouette is stable. Endotracheal tube in place with tip 2.5 cm above the carina. Extensive bilateral  infiltrates again noted. No pneumothorax.  IMPRESSION: Extensive bilateral infiltrates again noted. Endotracheal tube in place. No pneumothorax.   Electronically Signed   By: Natasha Mead M.D.   On: 05/07/2015 15:45   Dg Chest Port 1 View  05/07/2015   CLINICAL DATA:  Unresponsive, shortness of breath  EXAM: PORTABLE CHEST - 1 VIEW  COMPARISON:  05/06/2015  FINDINGS: New extensive perihilar and mid lung consolidative airspace disease since yesterday, compatible with severe acute pneumonia, aspiration, alveolar edema, or hemorrhage. No effusion or pneumothorax. Trachea is midline. Normal heart size and vascularity.  IMPRESSION: Extensive acute perihilar and mid lung consolidative bilateral airspace process as above.  No effusion or pneumothorax.   Electronically Signed   By: Judie Petit.  Shick M.D.   On: 05/07/2015 13:43     ASSESSMENT / PLAN:  PULMONARY OETT 9/17 >>  A: Acute Hypoxic Respiratory Failure Acute Hypercarbic Respiratory Failure HCAP vs aspiration PNA ARDS  P:   ARDS vent protocol  Minimize excessive fluids Duoneb q4hr See ID  CARDIOVASCULAR CVL L IJ CVC 9/17 >>  A:  Shock - Septic vs Cardiogenic  P:  Continuing Levophed to maintain MAP >65 TTE pending  RENAL A:   Acute Renal Failure - Improving Rhabdomyolysis - Improving. Hyperkalemia - Resolved. S/P Kayexalate. Metabolic/Lactic Acidosis - Trending q6hr Hypomagnesemia - Replacing IV.  P:   Trending Lactic Acid Trending CK D/C Bicarb gtt Monitoring UOP w/ Foley Trending daily BUN/Creatinine Trending daily electrolytes MagSulfate 1gm IV  GASTROINTESTINAL A:   No acute issue.  P:   Holding on tube feedings. Protonix IV q24hr  HEMATOLOGIC A:   Mild Anemia - No signs of active bleeding. Mild Thrombocytopenia  P:  Regularly 07Trending daily Hgb w/ CBC Trending platelets daily w/ CBC SCDs Heparin [place] q8hr  INFECTIOUS A:   HCAP vs aspiration PNA  P:   Procalcitonin Algorithm  BCx2 9/17 >>  UC 9/17  >>  Resp 9/17 >>   Aztreonam 9/17 >>  Vanco 9/17 >>   ENDOCRINE A:   No acute issues.  P:   D/C Hydrocortisone SSI  Accuchecks q4hr  NEUROLOGIC A:   Suicide Attempt w/ Overdose Acute ncephalopathy - Toxic Metabolic + Overdose. Head CT negative H/O Seizure d/o Sedation for MV Cocaine / narcs / benzo abuse EtOH abuse  P:   Seizure precautions RASS goal: 0 to -1 Fentanyl gtt Propofol gtt EEG pending Ativan IV q26min prn seizure Psychiatry evaluation if patient survives   FAMILY  - Updates: Patient's mother & girlfriend updated at bedside.  - Inter-disciplinary family meet or Palliative Care meeting due by: 05/14/15   TODAY'S SUMMARY:  45 year old male with aspiration pneumonia, septic shock, acute renal failure, rhabdomyolysis & ARDS after intentional overdose and suicide attempt. Patient's hyperkalemia has resolved. Continuing to trend renal function. Continuing ventilator per ARDS protocol given improvement in hypercarbia. Condition remains guarded. Patient has been served involuntary commitment papers.  I have spent a total of 40 minutes of critical care time today caring for the patient, updating the patient's mother/girlfriend at bedside, & reviewing the patient's electronic medical record.   Donna Christen Jamison Neighbor, M.D. [facility] Pager:  [phone] After 3pm or if no response, call [phone]  05/08/2015, 10:18 AM

## 2015-05-08 NOTE — Progress Notes (Signed)
CRITICAL VALUE ALERT  Critical value received: lactic acid 3.1 Date of notification:  05/08/15 Time of notification: 0230 Critical value read back:Yes.    Nurse who received alert:  c ayers  MD notified (1st page):  elink MD  Time of first page:  0300  MD notified (2nd page):  Time of second page:  Responding MD:  elink MD  Time MD responded: 0300

## 2015-05-08 NOTE — Progress Notes (Signed)
Utilization Review Completed.Dowell, Deborah T9/18/2016  

## 2015-05-08 NOTE — Progress Notes (Signed)
CRITICAL VALUE ALERT  Critical value received:  Lactic acid 2.3  Date of notification:  05/08/2015  Time of notification:  1455  Critical value read back:Yes.    Nurse who received alert:  Drue Stager, RN  MD notified (1st page):  Jerilee Hoh  Time of first page:  1320  MD notified (2nd page):  Time of second page:  Responding MD:  Jerilee Hoh   Time MD responded:  1320

## 2015-05-09 ENCOUNTER — Inpatient Hospital Stay (HOSPITAL_COMMUNITY): Payer: Medicare Other | Admitting: Anesthesiology

## 2015-05-09 ENCOUNTER — Inpatient Hospital Stay (HOSPITAL_COMMUNITY): Payer: Medicare Other

## 2015-05-09 ENCOUNTER — Inpatient Hospital Stay (HOSPITAL_COMMUNITY): Payer: Medicare Other | Admitting: Certified Registered"

## 2015-05-09 ENCOUNTER — Inpatient Hospital Stay (HOSPITAL_COMMUNITY)
Admit: 2015-05-09 | Discharge: 2015-05-09 | Disposition: A | Discharge status: Home / Self Care | Payer: Medicare Other | Attending: Pulmonary Disease | Admitting: Pulmonary Disease

## 2015-05-09 DIAGNOSIS — R4182 Altered mental status, unspecified: Secondary | ICD-10-CM | POA: Insufficient documentation

## 2015-05-09 DIAGNOSIS — R410 Disorientation, unspecified: Secondary | ICD-10-CM

## 2015-05-09 DIAGNOSIS — A419 Sepsis, unspecified organism: Secondary | ICD-10-CM | POA: Diagnosis not present

## 2015-05-09 DIAGNOSIS — T50902D Poisoning by unspecified drugs, medicaments and biological substances, intentional self-harm, subsequent encounter: Secondary | ICD-10-CM

## 2015-05-09 DIAGNOSIS — T1491 Suicide attempt: Secondary | ICD-10-CM

## 2015-05-09 LAB — BLOOD GAS, ARTERIAL
Acid-Base Excess: 1.7 mmol/L (ref 0.0–2.0)
Acid-base deficit: 1.4 mmol/L (ref 0.0–2.0)
Acid-base deficit: 0.8 mmol/L (ref 0.0–2.0)
BICARBONATE: 22 meq/L (ref 20.0–24.0)
BICARBONATE: 23.5 meq/L (ref 20.0–24.0)
Bicarbonate: 24.5 mEq/L — ABNORMAL HIGH (ref 20.0–24.0)
DRAWN BY: 331471
Drawn by: 331471
Drawn by: 331471
FIO2: 0.4
FIO2: 0.5
FIO2: 1
LHR: 14 {breaths}/min
MECHVT: 570 mL
O2 SAT: 86.7 %
O2 SAT: 94.3 %
O2 SAT: 98.9 %
PATIENT TEMPERATURE: 98.6
PCO2 ART: 33.3 mmHg — AB (ref 35.0–45.0)
PEEP: 5 cmH2O
PEEP: 8 cmH2O
PEEP: 10 cmH2O
PH ART: 7.434 (ref 7.350–7.450)
PH ART: 7.49 — AB (ref 7.350–7.450)
PH ART: 7.389 (ref 7.350–7.450)
PO2 ART: 56.8 mmHg — AB (ref 80.0–100.0)
Patient temperature: 98.6
Patient temperature: 98.6
RATE: 14 resp/min
RATE: 14 resp/min
TCO2: 20.7 mmol/L (ref 0–100)
TCO2: 22.8 mmol/L (ref 0–100)
TCO2: 21.8 mmol/L (ref 0–100)
VT: 570 mL
VT: 570 mL
pCO2 arterial: 32.5 mmHg — ABNORMAL LOW (ref 35.0–45.0)
pCO2 arterial: 39.7 mmHg (ref 35.0–45.0)
pO2, Arterial: 70.9 mmHg — ABNORMAL LOW (ref 80.0–100.0)
pO2, Arterial: 157 mmHg — ABNORMAL HIGH (ref 80.0–100.0)

## 2015-05-09 LAB — CBC WITH DIFFERENTIAL/PLATELET
BASOS ABS: 0 10*3/uL (ref 0.0–0.1)
BASOS PCT: 0 %
EOS ABS: 0 10*3/uL (ref 0.0–0.7)
Eosinophils Relative: 0 %
HEMATOCRIT: 24.7 % — AB (ref 39.0–52.0)
Hemoglobin: 8.4 g/dL — ABNORMAL LOW (ref 13.0–17.0)
LYMPHS ABS: 1.1 10*3/uL (ref 0.7–4.0)
Lymphocytes Relative: 11 %
MCH: 32.9 pg (ref 26.0–34.0)
MCHC: 34 g/dL (ref 30.0–36.0)
MCV: 96.9 fL (ref 78.0–100.0)
Monocytes Absolute: 0.3 10*3/uL (ref 0.1–1.0)
Monocytes Relative: 3 %
NEUTROS ABS: 8.2 10*3/uL — AB (ref 1.7–7.7)
Neutrophils Relative %: 86 %
Platelets: 115 10*3/uL — ABNORMAL LOW (ref 150–400)
RBC: 2.55 MIL/uL — ABNORMAL LOW (ref 4.22–5.81)
RDW: 15.7 % — AB (ref 11.5–15.5)
WBC: 9.6 10*3/uL (ref 4.0–10.5)

## 2015-05-09 LAB — RENAL FUNCTION PANEL
ALBUMIN: 2.3 g/dL — AB (ref 3.5–5.0)
Anion gap: 10 (ref 5–15)
BUN: 48 mg/dL — AB (ref 6–20)
CALCIUM: 7 mg/dL — AB (ref 8.9–10.3)
CO2: 26 mmol/L (ref 22–32)
CREATININE: 1.91 mg/dL — AB (ref 0.61–1.24)
Chloride: 104 mmol/L (ref 101–111)
GFR, EST AFRICAN AMERICAN: 48 mL/min — AB (ref >60)
GFR, EST NON AFRICAN AMERICAN: 41 mL/min — AB (ref >60)
Glucose, Bld: 103 mg/dL — ABNORMAL HIGH (ref 65–99)
Phosphorus: 2.7 mg/dL (ref 2.5–4.6)
Potassium: 3.4 mmol/L — ABNORMAL LOW (ref 3.5–5.1)
SODIUM: 140 mmol/L (ref 135–145)

## 2015-05-09 LAB — GLUCOSE, CAPILLARY
GLUCOSE-CAPILLARY: 128 mg/dL — AB (ref 65–99)
GLUCOSE-CAPILLARY: 73 mg/dL (ref 65–99)
GLUCOSE-CAPILLARY: 79 mg/dL (ref 65–99)
GLUCOSE-CAPILLARY: 82 mg/dL (ref 65–99)
GLUCOSE-CAPILLARY: 96 mg/dL (ref 65–99)

## 2015-05-09 LAB — PROCALCITONIN: Procalcitonin: 5.15 ng/mL

## 2015-05-09 LAB — LEGIONELLA ANTIGEN, URINE

## 2015-05-09 LAB — URINE CULTURE: CULTURE: NO GROWTH

## 2015-05-09 LAB — TRIGLYCERIDES: TRIGLYCERIDES: 351 mg/dL — AB (ref <150)

## 2015-05-09 LAB — LACTIC ACID, PLASMA: Lactic Acid, Venous: 1.6 mmol/L (ref 0.5–2.0)

## 2015-05-09 LAB — CK: CK TOTAL: 1061 U/L — AB (ref 49–397)

## 2015-05-09 LAB — CORTISOL: Cortisol, Plasma: 11.8 ug/dL

## 2015-05-09 LAB — MAGNESIUM: Magnesium: 1.9 mg/dL (ref 1.7–2.4)

## 2015-05-09 MED ORDER — PROPOFOL 10 MG/ML IV BOLUS
INTRAVENOUS | Status: DC | PRN
  Administered 2015-05-09: 200 mg via INTRAVENOUS

## 2015-05-09 MED ORDER — IPRATROPIUM-ALBUTEROL 0.5-2.5 (3) MG/3ML IN SOLN: 3.0000 mL | RESPIRATORY_TRACT | Status: DC | PRN

## 2015-05-09 MED ORDER — FOLIC ACID 5 MG/ML IJ SOLN
1.0000 mg | Freq: Every day | INTRAMUSCULAR | Status: DC
  Administered 2015-05-09 – 2015-05-20 (×12): 1 mg via INTRAVENOUS
  Filled 2015-05-09 (×13): qty 0.2

## 2015-05-09 MED ORDER — MIDAZOLAM HCL 2 MG/2ML IJ SOLN
1.0000 mg | INTRAMUSCULAR | Status: DC | PRN
  Administered 2015-05-09 (×2): 1 mg via INTRAVENOUS
  Administered 2015-05-09 – 2015-05-10 (×4): 2 mg via INTRAVENOUS
  Filled 2015-05-09 (×8): qty 2

## 2015-05-09 MED ORDER — ETOMIDATE 2 MG/ML IV SOLN
INTRAVENOUS | Status: AC
  Filled 2015-05-09: qty 20

## 2015-05-09 MED ORDER — SUCCINYLCHOLINE CHLORIDE 20 MG/ML IJ SOLN
INTRAMUSCULAR | Status: AC
  Administered 2015-05-09: 100 mg via INTRAVENOUS
  Filled 2015-05-09: qty 1

## 2015-05-09 MED ORDER — MIDAZOLAM HCL 5 MG/ML IJ SOLN
0.0000 mg/h | INTRAMUSCULAR | Status: DC
  Filled 2015-05-09 (×2): qty 10

## 2015-05-09 MED ORDER — FENTANYL CITRATE (PF) 100 MCG/2ML IJ SOLN
INTRAMUSCULAR | Status: AC
  Filled 2015-05-09: qty 4

## 2015-05-09 MED ORDER — ACETAMINOPHEN 160 MG/5ML PO SOLN
650.0000 mg | Freq: Once | ORAL | Status: AC
  Administered 2015-05-09: 650 mg
  Filled 2015-05-09: qty 20.3

## 2015-05-09 MED ORDER — ROCURONIUM BROMIDE 50 MG/5ML IV SOLN
INTRAVENOUS | Status: DC
Start: 2015-05-09 — End: 2015-05-09
  Filled 2015-05-09: qty 2

## 2015-05-09 MED ORDER — FREE WATER
50.0000 mL | Status: DC
  Administered 2015-05-09 – 2015-05-12 (×16): 50 mL

## 2015-05-09 MED ORDER — THIAMINE HCL 100 MG/ML IJ SOLN
100.0000 mg | Freq: Every day | INTRAMUSCULAR | Status: DC
  Administered 2015-05-09 – 2015-05-20 (×12): 100 mg via INTRAVENOUS
  Filled 2015-05-09 (×12): qty 2

## 2015-05-09 MED ORDER — DEXMEDETOMIDINE HCL IN NACL 200 MCG/50ML IV SOLN
0.4000 ug/kg/h | INTRAVENOUS | Status: DC
  Administered 2015-05-09 – 2015-05-10 (×2): 0.5 ug/kg/h via INTRAVENOUS
  Administered 2015-05-10: 0.6 ug/kg/h via INTRAVENOUS
  Filled 2015-05-09 (×4): qty 50

## 2015-05-09 MED ORDER — ROCURONIUM BROMIDE 50 MG/5ML IV SOLN
1.0000 mg/kg | Freq: Once | INTRAVENOUS | Status: AC
  Administered 2015-05-09: 85 mg via INTRAVENOUS
  Filled 2015-05-09: qty 8.53

## 2015-05-09 MED ORDER — PHENYLEPHRINE HCL 10 MG/ML IJ SOLN
INTRAMUSCULAR | Status: DC | PRN
  Administered 2015-05-09: 80 ug via INTRAVENOUS

## 2015-05-09 MED ORDER — LIDOCAINE HCL (CARDIAC) 20 MG/ML IV SOLN
INTRAVENOUS | Status: AC
  Filled 2015-05-09: qty 5

## 2015-05-09 MED ORDER — VITAL AF 1.2 CAL PO LIQD
1000.0000 mL | ORAL | Status: AC
  Administered 2015-05-09: 1000 mL
  Filled 2015-05-09 (×2): qty 1000

## 2015-05-09 MED ORDER — POTASSIUM CHLORIDE 10 MEQ/100ML IV SOLN
10.0000 meq | INTRAVENOUS | Status: AC
  Administered 2015-05-09 (×4): 10 meq via INTRAVENOUS
  Filled 2015-05-09 (×4): qty 100

## 2015-05-09 MED ORDER — MIDAZOLAM HCL 2 MG/2ML IJ SOLN
INTRAMUSCULAR | Status: AC
  Filled 2015-05-09: qty 4

## 2015-05-09 MED ORDER — VITAL HIGH PROTEIN PO LIQD
1000.0000 mL | ORAL | Status: DC
  Filled 2015-05-09: qty 1000

## 2015-05-09 NOTE — Progress Notes (Signed)
eLink Physician-Brief Progress Note Patient Name: Eddie Williams DOB: 1970-10-20 MRN: [id number]   Date of Service  05/09/2015  HPI/Events of Note  CXR reveals ETT to be 6.5 cm above the carina.   eICU Interventions  Will advance ETT 2 cm and re-secure tube.     Intervention Category Intermediate Interventions: Diagnostic test evaluation  Lenell Antu 05/09/2015, 4:38 PM

## 2015-05-09 NOTE — Progress Notes (Addendum)
eLink Physician-Brief Progress Note Patient Name: Eddie Williams DOB: [birth year] MRN: [id number]   Date of Service  05/09/2015  HPI/Events of Note  Self extubated . ARDS patient on fent/versed 60%/10 peep  eICU Interventions  Anesthesia stat intubation - they are at bedside. I gave CRNA run down of what I noted in chart  Post intubation do fent gtt + versed gtt + add precedexs gtt. Might need neuromuscular blockade for his ARDS  RASS goal -4  Pst intubation CXR 4.8cm above carina -> advance 1cm more     Intervention Category Major Interventions: Respiratory failure - evaluation and management  RAMASWAMY,MURALI 05/09/2015, 11:18 PM

## 2015-05-09 NOTE — Progress Notes (Signed)
PULMONARY / CRITICAL CARE MEDICINE   Name: Eddie Williams MRN: [id number] DOB: Feb 11, 1971    ADMISSION DATE:  05/07/2015  REFERRING MD :  [facility] ED  CHIEF COMPLAINT:  PNA, Acute respiratory distress  INITIAL PRESENTATION: 44 yo man, hx polysubstance abuse, seizures, COPD / asthma, presented with B infiltrates and altered MS 9/17. Possible toxic ingestion of benzos and narcs. Progressed to respiratory failure and required intubation and MV, then hypotension presumed septic shock.   STUDIES:  CXR 9/16 - no infiltrates CXR 9/17 - Bilateral infiltrates and consolidation, perihilar predominant but involving other fields as well CT Head 9/17 - no acute intracranial abnormality. EEG 9/18 - Pending Echo 9/18 > LVEF 55-60%, Grade 2 Diastolic dysfunction, LAE, RV mod dilated, systolic function mildly reduced, RA mod dilated, PA pressure 53 mm Hg  SIGNIFICANT EVENTS: 9/17 - Intubation & Admission 9/17 - Worsening mental status & questionable seizure activity  SUBJECTIVE: No acute events, EEG performed yesterday but result pending, oxygenation improving, urine output improving   VITAL SIGNS: Temp:  [97.9 F (36.6 C)-101.1 F (38.4 C)] 101.1 F (38.4 C) (09/19 1100) Pulse Rate:  [50-127] 80 (09/19 1100) Resp:  [0-35] 32 (09/19 1100) BP: (79-169)/(43-101) 109/66 mmHg (09/19 1100) SpO2:  [85 %-100 %] 100 % (09/19 1100) Arterial Line BP: (68-152)/(49-90) 129/89 mmHg (09/19 0800) FiO2 (%):  [40 %-50 %] 40 % (09/19 1057) Weight:  [188 lb 0.8 oz (85.3 kg)] 188 lb 0.8 oz (85.3 kg) (09/19 0600) HEMODYNAMICS: CVP:  [12 mmHg-16 mmHg] 12 mmHg VENTILATOR SETTINGS: Vent Mode:  [-] PRVC FiO2 (%):  [40 %-50 %] 40 % Set Rate:  [35 bmp] 35 bmp Vt Set:  [430 mL] 430 mL PEEP:  [5 cmH20-8 cmH20] 8 cmH20 Pressure Support:  [8 cmH20] 8 cmH20 Plateau Pressure:  [22 cmH20-38 cmH20] 25 cmH20 INTAKE / OUTPUT:  Intake/Output Summary (Last 24 hours) at 05/09/15 1107 Last data filed at 05/09/15 1002  Gross  per 24 hour  Intake 3729.01 ml  Output   2650 ml  Net 1079.01 ml    PHYSICAL EXAMINATION:  Gen: sedated on vent HENT: NCAT, ETT in place PULM: vent supported breaths, few crackles CV: RRR, no mgr, trace edema GI: BS+, soft, nontender Derm: no cyanosis or rash Neuro: sedated heavily on vent   LABS:  CBC  Recent Labs Lab 05/07/15 1250 05/08/15 0230 05/09/15 0445  WBC 10.8* 8.1 9.6  HGB 10.5* 10.6* 8.4*  HCT 31.9* 31.0* 24.7*  PLT 188 161 115*   Coag's No results for input(s): APTT, INR in the last 168 hours. BMET  Recent Labs Lab 05/08/15 05/08/15 0230 05/09/15 0400  NA 136 136 140  K 5.0 4.4 3.4*  CL 101 101 104  CO2 21* 21* 26  BUN 64* 70* 48*  CREATININE 4.78* 4.43* 1.91*  GLUCOSE 130* 133* 103*   Electrolytes  Recent Labs Lab 05/08/15 05/08/15 0230 05/09/15 0400  CALCIUM 7.4* 7.1* 7.0*  MG  --  1.6* 1.9  PHOS  --  3.6 2.7   Sepsis Markers  Recent Labs Lab 05/07/15 1705 05/08/15 0230 05/08/15 1410 05/08/15 2000 05/09/15 0400  LATICACIDVEN 2.1* 3.4* 2.3* 2.4* 1.6  PROCALCITON 16.09 19.43  --   --  5.15   ABG  Recent Labs Lab 05/08/15 0410 05/08/15 0520 05/08/15 0618  PHART 7.409 7.396 7.368  PCO2ART 31.1* 34.1* 38.2  PO2ART 76.5* 77.1* 75.2*   Liver Enzymes  Recent Labs Lab 05/07/15 1250 05/08/15 0230 05/09/15 0400  AST 72* 67*  --  ALT 18 22  --   ALKPHOS 57 53  --   BILITOT 0.5 0.5  --   ALBUMIN 3.6 3.0* 2.3*   Cardiac Enzymes  Recent Labs Lab 05/07/15 1705 05/07/15 1706 05/08/15 0200  TROPONINI <0.03 <0.03 <0.03   Glucose  Recent Labs Lab 05/08/15 0744 05/08/15 1231 05/08/15 1645 05/08/15 2015 05/09/15 0030 05/09/15 0806  GLUCAP 125* 114* 113* 111* 96 82    Imaging 9/17 CXR Images personally reviewed> ETT in place, bilateral airspace disease   ASSESSMENT / PLAN:  PULMONARY OETT 9/17 >>  A: Acute Hypoxic Respiratory Failure > improving Acute Hypercarbic Respiratory Failure HCAP vs  aspiration PNA ARDS > appears to be resolving P:   DC ARDS protocol TVol 8cc, RR 14, PEEP 5, FIO2 40, then repeat ABG CXR now Duoneb q4hr prn wheezing See ID Continuing IVF given rhabdo and AKI, will diurese when able   CARDIOVASCULAR CVL L IJ CVC 9/17 >>  A:  Shock - Septic  Pulmonary hypertension suggested on echo > most likely due to acute vasoconstriction in setting of ARDS P:  Continuing Levophed to maintain MAP >65 Will need f/u echo as outpatient   RENAL A:   Acute Renal Failure due to rhabdomyolysis- Improving Hypokalemia Metabolic/Lactic Acidosis - Trending q6hr Hypomagnesemia - Replacing IV. P:   Trending Lactic Acid Replace K Monitor BMET and UOP Replace electrolytes as needed Continue NS resuscitation for now   GASTROINTESTINAL A:   No acute issue P:   Tube feeding> start today Protonix IV q24hr  HEMATOLOGIC A:   Mild Anemia - No signs of active bleeding Mild Thrombocytopenia P:  Daily CBC Trending platelets daily w/ CBC SCDs Heparin [place] q8hr  INFECTIOUS A:   HCAP vs aspiration PNA  P:   Procalcitonin Algorithm > improving  BCx2 9/17 >>  UC 9/17 >>  Resp 9/17 >> GNR  Aztreonam 9/17 >> plan 5 days if still improving Vanco 9/17 >> 9/19  ENDOCRINE A:   No acute issues  P:   SSI  Accuchecks q4hr  NEUROLOGIC A:   Suicide Attempt w/ Overdose (benzo, narcotic, cocaine) Acute encephalopathy - Toxic Metabolic + Overdose. Head CT negative H/O Seizure d/o Sedation for MV EtOH abuse  P:   Seizure precautions RASS goal: -1 Fentanyl gtt PRN versed D/C Propofol gtt EEG pending D/c ativan Thiamine, folate Psychiatry evaluation post extubation   FAMILY  - Updates: Patient's mother updated at bedside 9/19  - Inter-disciplinary family meet or Palliative Care meeting due by: 05/14/15   I have spent a total of 42 minutes of critical care time today caring for the patient, updating the patient's mother at bedside, & reviewing the  patient's electronic medical record.   Heber Dunn Loring, MD [place] [facility] Pager: [phone] Cell: [phone] After 3pm or if no response, call [phone]  05/09/2015, 11:07 AM

## 2015-05-09 NOTE — Procedures (Signed)
ELECTROENCEPHALOGRAM REPORT   Patient: Eddie Williams      Room #: 1229 Age: 44 y.o.        Sex: male Referring Physician: Dr Delton Coombes Report Date:  05/09/2015        Interpreting Physician: Omelia Blackwater  History: NIV [name] is an 44 y.o. male AMS, toxin ingestion  Medications:  Scheduled: . antiseptic oral rinse  7 mL Mouth Rinse QID  . aztreonam  1 g Intravenous Q8H  . chlorhexidine gluconate  15 mL Mouth Rinse BID  . etomidate  20 mg Intravenous Once  . famotidine (PEPCID) IV  20 mg Intravenous Q24H  . fentaNYL (SUBLIMAZE) injection  50 mcg Intravenous Once  . folic acid  1 mg Intravenous Daily  . free water  50 mL Per Tube 6 times per day  . heparin  5,000 Units Subcutaneous 3 times per day  . insulin aspart  2-6 Units Subcutaneous 6 times per day  . pantoprazole (PROTONIX) IV  40 mg Intravenous Q24H  . rocuronium  1 mg/kg Intravenous Once  . thiamine IV  100 mg Intravenous Daily    Conditions of Recording:  This is a 16 channel EEG   Description:  The background activity consists of a medium to low voltage, symmetrical,mix of theta and delta activity. No focal slowing or epileptiform activity is noted.  Normal sleep architecture is not observed. Hyperventilation was not performed. Intermittent photic stimulation was not performed.    IMPRESSION: Abnormal EEG due to the presence of generalized background slowing indicating a moderate to severe cerebral disturbance. (encephalopathy). No epileptiform activity noted.    Elspeth Cho, DO [facility] [phone]  If 7pm- 7am, please page neurology on call as listed in AMION. 05/09/2015, 3:45 PM

## 2015-05-09 NOTE — Anesthesia Procedure Notes (Signed)
Procedure Name: Intubation Date/Time: 05/09/2015 11:25 PM Performed by: Early Osmond E Pre-anesthesia Checklist: Patient identified, Emergency Drugs available, Suction available and Patient being monitored Patient Re-evaluated:Patient Re-evaluated prior to inductionOxygen Delivery Method: Ambu bag Preoxygenation: Pre-oxygenation with 100% oxygen Intubation Type: IV induction and Cricoid Pressure applied Ventilation: Mask ventilation without difficulty Laryngoscope Size: Miller and 3 Grade View: Grade I Tube type: Subglottic suction tube Tube size: 7.5 mm Number of attempts: 1 Airway Equipment and Method: Stylet Placement Confirmation: ETT inserted through vocal cords under direct vision,  CO2 detector and breath sounds checked- equal and bilateral Secured at: 24 cm Tube secured with: Tape Dental Injury: Teeth and Oropharynx as per pre-operative assessment  Comments: Called to intubate patient after self extubation. Ett secured by RT with ett holder device

## 2015-05-09 NOTE — Progress Notes (Signed)
S:  Called to bedside for self-extubation  O:  Pt alert post extubation on 400 mcg Fentanyl.  Decreased saturations to 60's/70's.  BVM with 100%.    A:  Self Extubation / Acute Respiratory Failure   P: Emergent reintubation per Anesthesia, appreciate assistance. Increase PEEP to 10, FiO2 to 100% Follow up ABG in one hour, call results to [facility] STAT PCXR for ETT placement Wrist restraint order placed Continue IV Fentanyl gtt with PRN versed for now.     Canary Brim, NP-C [place] [facility] Pgr: [phone] or if no answer [phone] 05/09/2015, 4:10 PM

## 2015-05-09 NOTE — Progress Notes (Signed)
eLink Physician-Brief Progress Note Patient Name: Eddie Williams DOB: September 12, 1970 MRN: [id number]   Date of Service  05/09/2015  HPI/Events of Note  Agitation. Maxed out on a Fentanyl IV infusion at 400 mcg/hour and Versed IV PRN.  eICU Interventions  Will order a Versed IV infusion.      Intervention Category Minor Interventions: Agitation / anxiety - evaluation and management  Lenell Antu 05/09/2015, 10:54 PM

## 2015-05-09 NOTE — Anesthesia Procedure Notes (Signed)
Procedure Name: Intubation Performed by: Karlyne Greenspan Pre-anesthesia Checklist: Patient identified, Suction available, Emergency Drugs available, Patient being monitored and Timeout performed Patient Re-evaluated:Patient Re-evaluated prior to inductionOxygen Delivery Method: Ambu bag Preoxygenation: Pre-oxygenation with 100% oxygen Intubation Type: IV induction and Rapid sequence Ventilation: Mask ventilation with difficulty Laryngoscope Size: Mac and 3 Grade View: Grade II Tube type: Oral Tube size: 8.0 mm Number of attempts: 1 Airway Equipment and Method: Stylet Placement Confirmation: ETT inserted through vocal cords under direct vision Secured at: 24 cm Tube secured with: Tape Dental Injury: Teeth and Oropharynx as per pre-operative assessment

## 2015-05-09 NOTE — Progress Notes (Signed)
Offsite EEG completed at [facility]. Results pending. 

## 2015-05-09 NOTE — Progress Notes (Signed)
Nutrition Follow-up  DOCUMENTATION CODES:   Non-severe (moderate) malnutrition in context of chronic illness  INTERVENTION:  - Will order Vital AF 1.2 @ 70 mL/hr which will provide, including kcal from Propofol, 2129 kcal (95.5% estimated needs), 126 grams protein, and 1362 mL free water. Will also order 50 mL free water Q4h to add 300 mL/day. - RD will continue to monitor for needs  NUTRITION DIAGNOSIS:   Inadequate oral intake related to inability to eat as evidenced by NPO status. -ongoing  GOAL:   Patient will meet greater than or equal to 90% of their needs -unmet  MONITOR:   Vent status, TF tolerance, Weight trends, Labs, Skin, I & O's  REASON FOR ASSESSMENT:   Consult Enteral/tube feeding initiation and management  ASSESSMENT:   44 yo man, hx polysubstance abuse, seizures, COPD / asthma, presented with B infiltrates and altered MS 9/17. Possible toxic ingestion of benzos and narcs. Progressed to respiratory failure and required intubation and MV, then hypotension presumed septic shock.   9/19 New consult for TF initiation and management received.  Patient is currently intubated on ventilator support MV: 11.5 L/min Temp (24hrs), Avg:98.9 F (37.2 C), Min:97.9 F (36.6 C), Max:101.1 F (38.4 C)  Propofol: 4.3 mL/hr (113 kcal)  OGT in place and RN in the room reports about to start TF; TF protocol already in place. Nutrition needs based on current medical course, vent needs.  Not meeting needs at this time. Medications reviewed. Labs reviewed; CBGs: 82-125 mg/dL, K: 3.4 mmol/L, BUN/creatinine elevated, Ca: 7 mg/dL, GFR: 41.    1/61 - Per family at bedside, pt ate well PTA but did have some recent weight loss.  -Pt at one time was down to 145 lb but gained weight to 180 lb. Now he is 165 lb.  - Patient is currently intubated on ventilator support - MV: 15.4 L/min; Propofol: 21.3 ml/hr -> provides 562 fat kcal - Nutrition-Focused physical exam completed.  Findings are mild fat depletion, mild muscle depletion, and no edema.    Diet Order:  Diet NPO time specified  Skin:  Wound (see comment) (R knee and R elbow wounds)  Last BM:  PTA  Height:   Ht Readings from Last 1 Encounters:  05/07/15  (1.753 m)    Weight:   Wt Readings from Last 1 Encounters:  05/09/15 188 lb 0.8 oz (85.3 kg)    Ideal Body Weight:  72.7 kg  BMI:  Body mass index is 27.76 kg/(m^2).  Estimated Nutritional Needs:   Kcal:  2229  Protein:  102-128 grams  Fluid:  2L/day  EDUCATION NEEDS:   No education needs identified at this time     Trenton Gammon, RD, LDN Inpatient Clinical Dietitian Pager # [phone] After hours/weekend pager # [phone]

## 2015-05-09 NOTE — Progress Notes (Signed)
Was told by observer that patient was reaching up to pull on his ETT tube. When I walked into the room the tube was pulled out completely. Pt had been maxed out on 400 Mcg of fentanyl and had received prn Versed for comfort and sedation. PT was still able to follow commands with medication in his system, with a RASS score of -1, was reoriented continuously and informed that the tube had to remain in his mouth to help him breath. Pt safety mitts were on bilaterally. Before this incident pt had made no attempts to pull out ET tube and was following safe limitations. When the patient self-extubated the Ambu bag was used to oxygenate patient, Pola Corn was notified, respiratory was notified, and anesthesia was called. Pt was reintubated quickly and placement verification was preformed. Orders were added for bilateral soft wrist restraints by MD to prevent the patient from self-extubating again.  Ms. Suess (pt mothers) was called and informed that the patient had self-extubated and was now in bilateral wrist restraints for patient safety to prevent the patient from self extubating again. Mother understood the decision was in agreement with the use of restraints as a safety measure and stated she will be by to visit the patient tomorrow (05/09/2015)

## 2015-05-10 ENCOUNTER — Inpatient Hospital Stay (HOSPITAL_COMMUNITY): Payer: Medicare Other

## 2015-05-10 DIAGNOSIS — G9340 Encephalopathy, unspecified: Secondary | ICD-10-CM

## 2015-05-10 DIAGNOSIS — E440 Moderate protein-calorie malnutrition: Secondary | ICD-10-CM | POA: Insufficient documentation

## 2015-05-10 LAB — BLOOD GAS, ARTERIAL
Acid-base deficit: 0.9 mmol/L (ref 0.0–2.0)
Bicarbonate: 23.5 mEq/L (ref 20.0–24.0)
DRAWN BY: 331471
FIO2: 1
MECHVT: 570 mL
O2 Saturation: 99 %
PEEP/CPAP: 10 cmH2O
PO2 ART: 149 mmHg — AB (ref 80.0–100.0)
Patient temperature: 98.6
RATE: 14 resp/min
TCO2: 22.4 mmol/L (ref 0–100)
pCO2 arterial: 40.5 mmHg (ref 35.0–45.0)
pH, Arterial: 7.381 (ref 7.350–7.450)

## 2015-05-10 LAB — GLUCOSE, CAPILLARY
GLUCOSE-CAPILLARY: 78 mg/dL (ref 65–99)
GLUCOSE-CAPILLARY: 81 mg/dL (ref 65–99)
GLUCOSE-CAPILLARY: 89 mg/dL (ref 65–99)
GLUCOSE-CAPILLARY: 99 mg/dL (ref 65–99)
Glucose-Capillary: 96 mg/dL (ref 65–99)
Glucose-Capillary: 118 mg/dL — ABNORMAL HIGH (ref 65–99)
Glucose-Capillary: 143 mg/dL — ABNORMAL HIGH (ref 65–99)

## 2015-05-10 LAB — CBC WITH DIFFERENTIAL/PLATELET
BASOS ABS: 0 10*3/uL (ref 0.0–0.1)
BASOS PCT: 0 %
Eosinophils Absolute: 0.2 10*3/uL (ref 0.0–0.7)
Eosinophils Relative: 1 %
HEMATOCRIT: 26.3 % — AB (ref 39.0–52.0)
HEMOGLOBIN: 8.7 g/dL — AB (ref 13.0–17.0)
LYMPHS PCT: 10 %
Lymphs Abs: 1.6 10*3/uL (ref 0.7–4.0)
MCH: 32.7 pg (ref 26.0–34.0)
MCHC: 33.1 g/dL (ref 30.0–36.0)
MCV: 98.9 fL (ref 78.0–100.0)
MONO ABS: 0.6 10*3/uL (ref 0.1–1.0)
MONOS PCT: 4 %
NEUTROS ABS: 13.2 10*3/uL — AB (ref 1.7–7.7)
NEUTROS PCT: 85 %
Platelets: 121 10*3/uL — ABNORMAL LOW (ref 150–400)
RBC: 2.66 MIL/uL — ABNORMAL LOW (ref 4.22–5.81)
RDW: 16.1 % — ABNORMAL HIGH (ref 11.5–15.5)
WBC: 15.6 10*3/uL — ABNORMAL HIGH (ref 4.0–10.5)

## 2015-05-10 LAB — TRIGLYCERIDES
Triglycerides: 287 mg/dL — ABNORMAL HIGH (ref <150)
Triglycerides: 267 mg/dL — ABNORMAL HIGH

## 2015-05-10 LAB — RENAL FUNCTION PANEL
ALBUMIN: 2.2 g/dL — AB (ref 3.5–5.0)
ANION GAP: 10 (ref 5–15)
BUN: 22 mg/dL — ABNORMAL HIGH (ref 6–20)
CHLORIDE: 107 mmol/L (ref 101–111)
CO2: 25 mmol/L (ref 22–32)
Calcium: 7.6 mg/dL — ABNORMAL LOW (ref 8.9–10.3)
Creatinine, Ser: 1.11 mg/dL (ref 0.61–1.24)
GFR calc Af Amer: >60 mL/min (ref >60)
GFR calc non Af Amer: >60 mL/min (ref >60)
GLUCOSE: 79 mg/dL (ref 65–99)
PHOSPHORUS: 1.8 mg/dL — AB (ref 2.5–4.6)
POTASSIUM: 3.7 mmol/L (ref 3.5–5.1)
Sodium: 142 mmol/L (ref 135–145)

## 2015-05-10 LAB — BASIC METABOLIC PANEL
Anion gap: 9 (ref 5–15)
BUN: 21 mg/dL — AB (ref 6–20)
CALCIUM: 7.4 mg/dL — AB (ref 8.9–10.3)
CHLORIDE: 107 mmol/L (ref 101–111)
CO2: 25 mmol/L (ref 22–32)
CREATININE: 0.92 mg/dL (ref 0.61–1.24)
GFR calc Af Amer: >60 mL/min (ref >60)
GFR calc non Af Amer: >60 mL/min (ref >60)
Glucose, Bld: 100 mg/dL — ABNORMAL HIGH (ref 65–99)
Potassium: 3.6 mmol/L (ref 3.5–5.1)
Sodium: 141 mmol/L (ref 135–145)

## 2015-05-10 LAB — MAGNESIUM: MAGNESIUM: 2 mg/dL (ref 1.7–2.4)

## 2015-05-10 MED ORDER — POTASSIUM CHLORIDE 20 MEQ/15ML (10%) PO SOLN
20.0000 meq | ORAL | Status: AC
  Administered 2015-05-10 (×2): 20 meq
  Filled 2015-05-10 (×2): qty 15

## 2015-05-10 MED ORDER — MIDAZOLAM HCL 2 MG/2ML IJ SOLN
4.0000 mg | Freq: Once | INTRAMUSCULAR | Status: AC
  Administered 2015-05-10: 4 mg via INTRAVENOUS

## 2015-05-10 MED ORDER — HALOPERIDOL LACTATE 5 MG/ML IJ SOLN: 2.0000 mg | INTRAMUSCULAR | Status: DC | PRN

## 2015-05-10 MED ORDER — VITAL 1.5 CAL PO LIQD
1000.0000 mL | ORAL | Status: DC
  Administered 2015-05-10 – 2015-05-12 (×2): 1000 mL
  Filled 2015-05-10 (×5): qty 1000

## 2015-05-10 MED ORDER — ROCURONIUM BROMIDE 100 MG/10ML IV SOLN
1.0000 mg/kg | Freq: Once | INTRAVENOUS | Status: AC
  Administered 2015-05-10: 83.7 mg via INTRAVENOUS
  Filled 2015-05-10: qty 8.37

## 2015-05-10 MED ORDER — AZTREONAM 2 G IJ SOLR
2.0000 g | Freq: Three times a day (TID) | INTRAMUSCULAR | Status: DC
  Administered 2015-05-10 – 2015-05-12 (×6): 2 g via INTRAVENOUS
  Filled 2015-05-10 (×6): qty 2

## 2015-05-10 MED ORDER — MIDAZOLAM HCL 2 MG/2ML IJ SOLN
2.0000 mg | INTRAMUSCULAR | Status: DC | PRN
  Administered 2015-05-14: 2 mg via INTRAVENOUS
  Administered 2015-05-14 (×2): 1 mg via INTRAVENOUS
  Administered 2015-05-14 – 2015-05-17 (×7): 2 mg via INTRAVENOUS
  Filled 2015-05-10 (×4): qty 2

## 2015-05-10 MED ORDER — MIDAZOLAM HCL 2 MG/2ML IJ SOLN
INTRAMUSCULAR | Status: AC
  Administered 2015-05-10: 2 mg via INTRAVENOUS
  Filled 2015-05-10: qty 4

## 2015-05-10 MED ORDER — MIDAZOLAM HCL 2 MG/2ML IJ SOLN
2.0000 mg | Freq: Once | INTRAMUSCULAR | Status: AC
  Administered 2015-05-10: 2 mg via INTRAVENOUS

## 2015-05-10 MED ORDER — PROPOFOL 1000 MG/100ML IV EMUL
INTRAVENOUS | Status: AC
  Filled 2015-05-10: qty 100

## 2015-05-10 MED ORDER — SODIUM CHLORIDE 0.9 % IV SOLN
0.0000 mg/h | INTRAVENOUS | Status: DC
  Administered 2015-05-10: 2 mg/h via INTRAVENOUS
  Administered 2015-05-10 – 2015-05-12 (×6): 8 mg/h via INTRAVENOUS
  Administered 2015-05-12: 6 mg/h via INTRAVENOUS
  Administered 2015-05-12: 8 mg/h via INTRAVENOUS
  Administered 2015-05-13 (×2): 6 mg/h via INTRAVENOUS
  Administered 2015-05-14 (×2): 2 mg/h via INTRAVENOUS
  Administered 2015-05-14: 7 mg/h via INTRAVENOUS
  Administered 2015-05-14: 9 mg/h via INTRAVENOUS
  Administered 2015-05-14: 8 mg/h via INTRAVENOUS
  Administered 2015-05-14: 9 mg/h via INTRAVENOUS
  Administered 2015-05-15: 10 mg/h via INTRAVENOUS
  Administered 2015-05-15: 2 mg/h via INTRAVENOUS
  Administered 2015-05-15: 5 mg/h via INTRAVENOUS
  Administered 2015-05-15 (×5): 2 mg/h via INTRAVENOUS
  Administered 2015-05-16: 10 mg/h via INTRAVENOUS
  Administered 2015-05-16: 8 mg/h via INTRAVENOUS
  Administered 2015-05-16: 4 mg/h via INTRAVENOUS
  Administered 2015-05-17 (×2): 10 mg/h via INTRAVENOUS
  Administered 2015-05-17: 9 mg/h via INTRAVENOUS
  Administered 2015-05-17: 2 mg/h via INTRAVENOUS
  Administered 2015-05-17: 10 mg/h via INTRAVENOUS
  Administered 2015-05-18: 7 mg/h via INTRAVENOUS
  Filled 2015-05-10 (×25): qty 10

## 2015-05-10 MED ORDER — MIDAZOLAM HCL 2 MG/2ML IJ SOLN
INTRAMUSCULAR | Status: AC
  Filled 2015-05-10: qty 2

## 2015-05-10 MED ORDER — PROPOFOL 1000 MG/100ML IV EMUL
5.0000 ug/kg/min | INTRAVENOUS | Status: DC
  Administered 2015-05-10: 10 ug/kg/min via INTRAVENOUS
  Administered 2015-05-10 – 2015-05-11 (×4): 50 ug/kg/min via INTRAVENOUS
  Administered 2015-05-11: 60 ug/kg/min via INTRAVENOUS
  Administered 2015-05-11: 50 ug/kg/min via INTRAVENOUS
  Administered 2015-05-12: 20 ug/kg/min via INTRAVENOUS
  Administered 2015-05-12: 50 ug/kg/min via INTRAVENOUS
  Administered 2015-05-12: 25 ug/kg/min via INTRAVENOUS
  Administered 2015-05-12 (×2): 50 ug/kg/min via INTRAVENOUS
  Administered 2015-05-13: 40 ug/kg/min via INTRAVENOUS
  Administered 2015-05-13 (×2): 35 ug/kg/min via INTRAVENOUS
  Administered 2015-05-14: 30 ug/kg/min via INTRAVENOUS
  Administered 2015-05-14: 20 ug/kg/min via INTRAVENOUS
  Filled 2015-05-10 (×16): qty 100

## 2015-05-10 NOTE — Progress Notes (Signed)
Date:  Sept. 20, 2016 U.R. performed for needs and level of care. Self extubated and reintubated. Will continue to follow for Case Management needs.  Marcelle Smiling, RN, BSN, [place]   [phone]

## 2015-05-10 NOTE — Progress Notes (Signed)
[facility] ADULT ICU REPLACEMENT PROTOCOL FOR AM LAB REPLACEMENT ONLY  The patient does apply for the [facility] Adult ICU Electrolyte Replacment Protocol based on the criteria listed below:   1. Is GFR >/= 40 ml/min? Yes.    Patient's GFR today is 41 2. Is urine output >/= 0.5 ml/kg/hr for the last 6 hours? Yes.   Patient's UOP is 2.6 ml/kg/hr 3. Is BUN < 60 mg/dL? Yes.    Patient's BUN today is 41 4. Abnormal electrolyte(s):K3.4 5. Ordered repletion with: per protocol 6. If a panic level lab has been reported, has the CCM MD in charge been notified? Yes.  .   Physician:  Dwain Sarna 05/10/2015 5:40 AM

## 2015-05-10 NOTE — Progress Notes (Addendum)
Rt had to go up on FIO2 due to pt sats. Pt is max out on sedation. Pt is very agitated.

## 2015-05-10 NOTE — Progress Notes (Signed)
eLink Physician-Brief Progress Note Patient Name: Eddie Williams DOB: [birth year] MRN: [id number]   Date of Service  05/10/2015  HPI/Events of Note  Patient remains agitated in spide of Fentanyl IV infusion at 400 mcg/hour and Versed IV infusion at 8 mg/hour.   eICU Interventions  Will add Versed 2 mg IV Q 1 hour as a rescue dose.      Intervention Category Minor Interventions: Agitation / anxiety - evaluation and management  Sommer,Steven Eugene 05/10/2015, 3:48 PM

## 2015-05-10 NOTE — Progress Notes (Addendum)
Rt assisted MD with bedside bronch.

## 2015-05-10 NOTE — Progress Notes (Signed)
ANTIBIOTIC CONSULT NOTE - FOLLOW UP  Pharmacy Consult for Aztreonam Indication: HCAP  Allergies  Allergen Reactions  . Penicillins Anaphylaxis  . Ibuprofen Nausea Only  . Nsaids Nausea And Vomiting  . Erythromycin Rash    Patient Measurements: Height:  (175.3 cm) Weight: 184 lb 8.4 oz (83.7 kg) IBW/kg (Calculated) : 70.7  Vital Signs: Temp: 101.7 F (38.7 C) (09/20 1000) Temp Source: Core (Comment) (09/20 0800) BP: 99/56 mmHg (09/20 1000) Pulse Rate: 82 (09/20 1000) Intake/Output from previous day: 09/19 0701 - 09/20 0700 In: 2083 [I.V.:1045; NG/GT:438; IV Piggyback:600] Out: 2940 [Urine:2840; Emesis/NG output:100] Intake/Output from this shift: Total I/O In: 241 [I.V.:191; IV Piggyback:50] Out: 250 [Urine:250]  Labs:  Recent Labs  05/08/15 0230 05/09/15 0400 05/09/15 0445 05/10/15 0430  WBC 8.1  --  9.6 15.6*  HGB 10.6*  --  8.4* 8.7*  PLT 161  --  115* 121*  CREATININE 4.43* 1.91*  --  1.11   Estimated Creatinine Clearance: 85.8 mL/min (by C-G formula based on Cr of 1.11).  Recent Labs  05/08/15 1410  VANCORANDOM 8     Microbiology: Recent Results (from the past 720 hour(s))  Culture, respiratory (NON-Expectorated)     Status: None   Collection Time: 05/07/15 11:54 AM  Result Value Ref Range Status   Specimen Description SPUTUM  Final   Special Requests NONE  Final   Gram Stain   Final    FEW WBC PRESENT, PREDOMINANTLY PMN FEW SQUAMOUS EPITHELIAL CELLS PRESENT MODERATE GRAM NEGATIVE RODS FEW GRAM POSITIVE RODS FEW YEAST    Culture   Final    ABUNDANT ESCHERICHIA COLI Performed at [facility]    Report Status 05/10/2015 FINAL  Final   Organism ID, Bacteria ESCHERICHIA COLI  Final      Susceptibility   Escherichia coli - MIC*    AMPICILLIN >=32 RESISTANT Resistant     AMPICILLIN/SULBACTAM 4 SENSITIVE Sensitive     CEFEPIME <=1 SENSITIVE Sensitive     CEFTAZIDIME <=1 SENSITIVE Sensitive     CEFTRIAXONE <=1 SENSITIVE  Sensitive     CIPROFLOXACIN >=4 RESISTANT Resistant     GENTAMICIN <=1 SENSITIVE Sensitive     IMIPENEM <=0.25 SENSITIVE Sensitive     PIP/TAZO <=4 SENSITIVE Sensitive     TOBRAMYCIN <=1 SENSITIVE Sensitive     TRIMETH/SULFA <=20 SENSITIVE Sensitive     * ABUNDANT ESCHERICHIA COLI  Culture, sputum-assessment     Status: None   Collection Time: 05/07/15  2:17 PM  Result Value Ref Range Status   Specimen Description SPUTUM  Final   Special Requests Immunocompromised  Final   Sputum evaluation   Final    THIS SPECIMEN IS ACCEPTABLE. RESPIRATORY CULTURE REPORT TO FOLLOW.   Report Status 05/07/2015 FINAL  Final  Urine culture     Status: None   Collection Time: 05/07/15  3:11 PM  Result Value Ref Range Status   Specimen Description URINE, CLEAN CATCH  Final   Special Requests Immunocompromised  Final   Culture   Final    NO GROWTH 2 DAYS Performed at [facility]    Report Status 05/09/2015 FINAL  Final  Blood Culture (routine x 2)     Status: None (Preliminary result)   Collection Time: 05/07/15  5:00 PM  Result Value Ref Range Status   Specimen Description BLOOD CENTRAL LINE  Final   Special Requests BOTTLES DRAWN AEROBIC AND ANAEROBIC 5 CC EACH  Final   Culture   Final  NO GROWTH 2 DAYS Performed at [facility]    Report Status PENDING  Incomplete  Blood Culture (routine x 2)     Status: None (Preliminary result)   Collection Time: 05/07/15  5:50 PM  Result Value Ref Range Status   Specimen Description BLOOD RIGHT ARM  Final   Special Requests   Final    BOTTLES DRAWN AEROBIC AND ANAEROBIC IMMUNOCOMPROMISED   Culture   Final    NO GROWTH 1 DAY Performed at [facility]    Report Status PENDING  Incomplete  MRSA PCR Screening     Status: None   Collection Time: 05/07/15  6:46 PM  Result Value Ref Range Status   MRSA by PCR NEGATIVE NEGATIVE Final    Comment:        The GeneXpert MRSA Assay (FDA approved for NASAL specimens only), is one  component of a comprehensive MRSA colonization surveillance program. It is not intended to diagnose MRSA infection nor to guide or monitor treatment for MRSA infections.     Anti-infectives    Start     Dose/Rate Route Frequency Ordered Stop   05/08/15 1530  vancomycin (VANCOCIN) 1,250 mg in sodium chloride 0.9 % 250 mL IVPB     1,250 mg 166.7 mL/hr over 90 Minutes Intravenous  Once 05/08/15 1509 05/08/15 1646   05/08/15 0200  aztreonam (AZACTAM) 1 g in dextrose 5 % 50 mL IVPB     1 g 100 mL/hr over 30 Minutes Intravenous Every 8 hours 05/07/15 1504     05/07/15 1400  aztreonam (AZACTAM) 2 g in dextrose 5 % 50 mL IVPB     2 g 100 mL/hr over 30 Minutes Intravenous  Once 05/07/15 1354 05/07/15 1529   05/07/15 1400  vancomycin (VANCOCIN) IVPB 1000 mg/200 mL premix     1,000 mg 200 mL/hr over 60 Minutes Intravenous  Once 05/07/15 1354 05/07/15 1529      Assessment: 44yo M presents to ED 9/17 w/ shortness of breath and wheezing. Recent admission and ED visits with similar symptoms, but left AMA on 9/16. He progressed to respiratory failure, hypotension, and required emergent intubation in ED. CXR was consistent with HCAP and pt was admitted with septic shock. Pharmacy consulted to dose Aztreonam, Vanc stopped 9/19.  9/17 >> Aztreonam >> 9/17 >> Vanc >> 9/19  9/17 blood x2: NGTD 9/17 urine: NGF 9/17 sputum: abundant E-coli >> sens rocephin, imipenem, bactrim. Resistant to cipro, ampicillin  Dose changes/levels: Notes mention Aztreonam  IV q8h but appears was on 1g IV q8h from beginning of tx    Today, 05/10/2015: Temp: 103.1 WBC: Increased 9.6 (9/19) >> 15.6 (9/20) Renal: SCr radically improved, previously > 4, now 0.92; CrCl ~103 CG. LA resolved (9/19) PCT trending down nicely CXR: persistent bilateral infiltrates unchanged  Goal of Therapy:  Appropriate antibiotic dosing for renal function; eradication of infection  Plan:  - Increase Aztreonam to 2g IV q8h based  on improvement in renal function, body wt, and new fevers/leukocytosis. Also take into consideration that pt has not improved on 1g IV q8h. - If no improvement in 24h may consider alt tx - Follow up renal fxn, culture results, and clinical course  Hammer, Ethan 05/10/2015,10:46 AM

## 2015-05-10 NOTE — Progress Notes (Signed)
Ventilator alarming, pt in bed leaning to left side with ett in restrained lt hand. Sats in 40s.  Elink, anesthesia notified. Pt bagged until ett tube replaced.

## 2015-05-10 NOTE — Op Note (Signed)
PCCM Video Bronchoscopy Procedure Note  The patient was informed of the risks (including but not limited to bleeding, infection, respiratory failure, lung injury, tooth/oral injury) and benefits of the procedure and gave consent, see chart. > NOT PERFORMED as EMERGENT PROCEDURE  Indication: check tube placement in setting of respiratory failure  Post Procedure Diagnosis: acute respiratory failure, ARDS  Location: [facility] room 1229  Condition pre procedure: Critically ill, on vent  Medications for procedure: versed  IV, fentanyl gtt  Procedure description: The bronchoscope was introduced through the endotracheal tube and passed to the bilateral lungs to the level of the subsegmental bronchi throughout the tracheobronchial tree.  Airway exam revealed thick brown secretions in the trachea. The ETT was 5cm above the carina.  There were no airway lesions but the exam was limited somewhat.  BAL performed RML, some thick brown secretions removed.  Procedures performed: BAL RML  Specimens sent: BAL culture> bacterial  Condition post procedure: critically ill on vent  EBL: none  Complications: none  Heber Hebron, MD [place] PCCM Pager: [phone] Cell: [phone] After 3pm or if no response, call [phone]

## 2015-05-10 NOTE — Progress Notes (Signed)
eLink Physician-Brief Progress Note Patient Name: Eddie Williams DOB: 24-Jan-1971 MRN: [id number]   Date of Service  05/10/2015  HPI/Events of Note  Remains agitated on Fentanyl and Versed IV infusions and rescue boluses.   eICU Interventions  Will order: 1. Haldol 2 mg IV Q 4 hours PRN. 2. Monitor QTc interval Q 6 hours.      Intervention Category Minor Interventions: Agitation / anxiety - evaluation and management  Sommer,Steven Eugene 05/10/2015, 4:40 PM

## 2015-05-10 NOTE — Progress Notes (Signed)
PULMONARY / CRITICAL CARE MEDICINE   Name: Eddie Williams MRN: [id number] DOB: [birth year]    ADMISSION DATE:  05/07/2015  REFERRING MD :  [facility] ED  CHIEF COMPLAINT:  PNA, Acute respiratory distress  INITIAL PRESENTATION: 44 y/o man, hx polysubstance abuse, seizures, COPD / asthma, presented with B infiltrates and altered MS 9/17. Possible toxic ingestion of benzos and narcs. Progressed to respiratory failure and required intubation and MV, then hypotension presumed septic shock.   STUDIES:  CXR 9/16 >> no infiltrates CXR 9/17 >> Bilateral infiltrates and consolidation, perihilar predominant but involving other fields as well CT Head 9/17 >> no acute intracranial abnormality. EEG 9/18 >> abnormal EEG with presence of generalized background slowing / encephalopathy.  NO seizures. Echo 9/18 >> LVEF 55-60%, Grade 2 Diastolic dysfunction, LAE, RV mod dilated, systolic function mildly reduced, RA mod dilated, PA pressure 53 mm Hg  SIGNIFICANT EVENTS: 9/17 - Intubation & Admission 9/17 - Worsening mental status & questionable seizure activity 9/19 - Self extubated x2, significant agitation  9/20 - ETT above cords, pt able to make sounds on NP exam.  Tube adjusted and FOB at bedside for placement, BAL.    SUBJECTIVE:  Self extubated overnight x2.  Febrile to 102 overnight.     VITAL SIGNS: Temp:  [99.3 F (37.4 C)-102.6 F (39.2 C)] 101.7 F (38.7 C) (09/20 1000) Pulse Rate:  [73-105] 82 (09/20 1000) Resp:  [14-32] 25 (09/20 1000) BP: (86-191)/(38-150) 99/56 mmHg (09/20 1000) SpO2:  [79 %-100 %] 94 % (09/20 1000) Arterial Line BP: (70-80)/(49-59) 80/59 mmHg (09/19 1109) FiO2 (%):  [40 %-100 %] 70 % (09/20 0805) Weight:  [184 lb 8.4 oz (83.7 kg)] 184 lb 8.4 oz (83.7 kg) (09/20 0600)   HEMODYNAMICS:     VENTILATOR SETTINGS: Vent Mode:  [-] PRVC FiO2 (%):  [40 %-100 %] 70 % Set Rate:  [14 bmp-35 bmp] 14 bmp Vt Set:  [430 mL-570 mL] 570 mL PEEP:  [5 cmH20-10 cmH20] 10  cmH20 Plateau Pressure:  [20 cmH20-26 cmH20] 23 cmH20   INTAKE / OUTPUT:  Intake/Output Summary (Last 24 hours) at 05/10/15 1054 Last data filed at 05/10/15 1000  Gross per 24 hour  Intake 2180.11 ml  Output   2780 ml  Net -599.89 ml    PHYSICAL EXAMINATION:  Gen: sedated on vent, anxious HENT: NCAT, ETT in place PULM: even/non-labored on vent, lungs bilaterally with rhonchi CV: RRR, no mgr, trace edema GI: BS+, soft, nontender Derm: no cyanosis or rash Neuro: sedated heavily on vent   LABS:  CBC  Recent Labs Lab 05/08/15 0230 05/09/15 0445 05/10/15 0430  WBC 8.1 9.6 15.6*  HGB 10.6* 8.4* 8.7*  HCT 31.0* 24.7* 26.3*  PLT 161 115* 121*   Coag's No results for input(s): APTT, INR in the last 168 hours.   BMET  Recent Labs Lab 05/09/15 0400 05/10/15 0430 05/10/15 0920  NA 140 142 141  K 3.4* 3.7 3.6  CL 104 107 107  CO2 BUN 48* 22* 21*  CREATININE 1.91* 1.11 0.92  GLUCOSE 103* 79 100*   Electrolytes  Recent Labs Lab 05/08/15 0230 05/09/15 0400 05/10/15 0430 05/10/15 0920  CALCIUM 7.1* 7.0* 7.6* 7.4*  MG 1.6* 1.9 2.0  --   PHOS 3.6 2.7 1.8*  --    Sepsis Markers  Recent Labs Lab 05/07/15 1705 05/08/15 0230 05/08/15 1410 05/08/15 2000 05/09/15 0400  LATICACIDVEN 2.1* 3.4* 2.3* 2.4* 1.6  PROCALCITON 16.09 19.43  --   --  5.15   ABG  Recent Labs Lab 05/09/15 1158 05/09/15 1359 05/09/15 1710  PHART 7.434 7.490* 7.389  PCO2ART 33.3* 32.5* 39.7  PO2ART 56.8* 70.9* 157*   Liver Enzymes  Recent Labs Lab 05/07/15 1250 05/08/15 0230 05/09/15 0400 05/10/15 0430  AST 72* 67*  --   --   ALT 18 22  --   --   ALKPHOS 57 53  --   --   BILITOT 0.5 0.5  --   --   ALBUMIN 3.6 3.0* 2.3* 2.2*   Cardiac Enzymes  Recent Labs Lab 05/07/15 1705 05/07/15 1706 05/08/15 0200  TROPONINI <0.03 <0.03 <0.03   Glucose  Recent Labs Lab 05/09/15 1212 05/09/15 1615 05/09/15 1949 05/10/15 0031 05/10/15 0352 05/10/15 0821   GLUCAP 128* 79 73 78 81 89    Imaging 9/20  CXR Images personally reviewed > ETT in place, diffuse bilateral airspace disease   ASSESSMENT / PLAN:  PULMONARY OETT 9/17 >> 9/19, 9/19>> A: Acute Hypoxic Respiratory Failure > improving Acute Hypercarbic Respiratory Failure HCAP vs aspiration PNA ARDS > appears to be resolving Self Extubation x2 - 9/19 P:   Vt 8cc/kg, RR 14 Wean PEEP/FiO2 for saturations > 905 Intermittent CXR Duoneb q4hr prn wheezing See ID Diuresis when able Bronch 9/20 for BAL & determine placement of ETT >>NEEDS TO BE AT 27 cm   CARDIOVASCULAR CVL L IJ CVC 9/17 >>  A:  Shock - Septic  Pulmonary hypertension suggested on echo > most likely due to acute vasoconstriction in setting of ARDS P:  Tele monitoring  Will need f/u echo as outpatient   RENAL A:   Acute Renal Failure due to rhabdomyolysis- Improving Hypokalemia Metabolic/Lactic Acidosis - Trending q6hr Hypomagnesemia - Replacing IV. P:   Trend BMP / UOP  Replace electrolytes as needed Reduce NS to 59ml/hr   GASTROINTESTINAL A:   Moderate Protein Calorie Malnutrition P:   Tube feeding per nutrition  Protonix IV q24hr  HEMATOLOGIC A:   Mild Anemia - No signs of active bleeding Mild Thrombocytopenia P:  Daily CBC Trend platelets daily w/ CBC SCDs Heparin Wyatt q8hr  INFECTIOUS A:   HCAP vs aspiration PNA Fever - noted 9/19, tmax 102 P:   Procalcitonin Algorithm > improving  BCx2 9/17 >>  UC 9/17 >>  Resp 9/17 >> E-Coli >> sens rocephin, imipenem, bactrim.  Resistant to cipro, ampicillin FOB BAL 9/20 >>   Aztreonam 9/17 >>  Vanco 9/17 >> 9/19  ENDOCRINE A:   No acute issues P:   SSI  Accuchecks q4hr  NEUROLOGIC A:   Suicide Attempt w/ Overdose (benzo, narcotic, cocaine) Acute encephalopathy - Toxic Metabolic + Overdose. Head CT negative H/O Seizure disorder - EEG 9/19 without evidence of seizure Sedation for MV EtOH abuse P:   Seizure precautions RASS  goal: -2 Fentanyl gtt Versed gtt Thiamine, folate Psychiatry evaluation post extubation   FAMILY  - Updates: Patient's mother updated at bedside 9/20  - Inter-disciplinary family meet or Palliative Care meeting due by: 05/14/15    Canary Brim, NP-C  Pulmonary & Critical Care Pgr: [phone] or if no answer [phone] 05/10/2015, 11:14 AM   Attending:  I have seen and examined the patient with nurse practitioner/resident and agree with the note above.   Mr. Yang clearly needs heavy sedation as he self extubated twice last night.  He now has a higher WBC, fever, and his vent support is back up unfortunately.  On exam: Agitated Lungs with crackles, bilateral air entry  via vent support Tachycardic Belly soft  Labs reviewed: WBC 15  Acute respiratory failure > I performed an emergent bronchoscopy on rounds to confirm tube placement as there was question whether or not he had self extubated again.  The endotracheal tube was adjusted and a BAL was performed due to fever, WBC> follow culture but continue to treat ecoli with aztreonam for now Will continue heavy sedation > fentanyl and versed gtt titrated to RASS -2  Mother updated bedside Rest as above  My cc time 40 minutes  Heber [place], MD [place] PCCM Pager: [phone] Cell: [phone] After 3pm or if no response, call [phone]

## 2015-05-10 NOTE — Progress Notes (Signed)
Nutrition Follow-up  DOCUMENTATION CODES:   Non-severe (moderate) malnutrition in context of chronic illness  INTERVENTION:   Changing tube feeding formula to Vital 1.5 to better meet patient's needs. Initiate Vital 1.5 @ 60 ml/hr, increase by 5 ml after 4 hours to goal rate of 65 ml/hr.  Tube feeding regimen provides 2340 kcal (93% of needs), 105 g protein (95% of needs) and 1191 ml of H2O.  Continue 50 mL free water Q4h to add 300 mL/day.  RD to continue to monitor  NUTRITION DIAGNOSIS:   Inadequate oral intake related to inability to eat as evidenced by NPO status.  Ongoing.  GOAL:   Patient will meet greater than or equal to 90% of their needs  Progressing.  MONITOR:   Vent status, TF tolerance, Weight trends, Labs, Skin, I & O's  ASSESSMENT:   44 yo man, hx polysubstance abuse, seizures, COPD / asthma, presented with B infiltrates and altered MS 9/17. Possible toxic ingestion of benzos and narcs. Progressed to respiratory failure and required intubation and MV, then hypotension presumed septic shock.   Patient is currently intubated on ventilator support MV: 18.8 L/min Temp (24hrs), Avg:101.3 F (38.5 C), Min:99.3 F (37.4 C), Max:103.1 F (39.5 C)  Propofol: none   Diet Order:  Diet NPO time specified  Skin:  Wound (see comment) (R knee and R elbow wounds)  Last BM:  PTA  Height:   Ht Readings from Last 1 Encounters:  05/07/15  (1.753 m)    Weight:   Wt Readings from Last 1 Encounters:  05/10/15 184 lb 8.4 oz (83.7 kg)    Ideal Body Weight:  72.7 kg  BMI:  Body mass index is 27.24 kg/(m^2).  Estimated Nutritional Needs:   Kcal:  2229  Protein:  102-128 grams  Fluid:  2L/day  EDUCATION NEEDS:   No education needs identified at this time  Tilda Franco, MS, RD, LDN Pager: [phone] After Hours Pager: [phone]

## 2015-05-11 ENCOUNTER — Inpatient Hospital Stay (HOSPITAL_COMMUNITY): Payer: Medicare Other

## 2015-05-11 DIAGNOSIS — J80 Acute respiratory distress syndrome: Secondary | ICD-10-CM

## 2015-05-11 LAB — GLUCOSE, CAPILLARY
GLUCOSE-CAPILLARY: 125 mg/dL — AB (ref 65–99)
GLUCOSE-CAPILLARY: 156 mg/dL — AB (ref 65–99)
GLUCOSE-CAPILLARY: 117 mg/dL — AB (ref 65–99)
Glucose-Capillary: 99 mg/dL (ref 65–99)
Glucose-Capillary: 125 mg/dL — ABNORMAL HIGH (ref 65–99)
Glucose-Capillary: 131 mg/dL — ABNORMAL HIGH (ref 65–99)
Glucose-Capillary: 112 mg/dL — ABNORMAL HIGH (ref 65–99)

## 2015-05-11 LAB — CBC WITH DIFFERENTIAL/PLATELET
Basophils Absolute: 0 10*3/uL (ref 0.0–0.1)
Basophils Relative: 0 %
Eosinophils Absolute: 0.5 10*3/uL (ref 0.0–0.7)
Eosinophils Relative: 4 %
HCT: 25.5 % — ABNORMAL LOW (ref 39.0–52.0)
Hemoglobin: 8.3 g/dL — ABNORMAL LOW (ref 13.0–17.0)
Lymphocytes Relative: 11 %
Lymphs Abs: 1.5 10*3/uL (ref 0.7–4.0)
MCH: 32.7 pg (ref 26.0–34.0)
MCHC: 32.5 g/dL (ref 30.0–36.0)
MCV: 100.4 fL — ABNORMAL HIGH (ref 78.0–100.0)
Monocytes Absolute: 0.9 10*3/uL (ref 0.1–1.0)
Monocytes Relative: 7 %
Neutro Abs: 10.6 10*3/uL — ABNORMAL HIGH (ref 1.7–7.7)
Neutrophils Relative %: 78 %
Platelets: UNDETERMINED 10*3/uL (ref 150–400)
RBC: 2.54 MIL/uL — ABNORMAL LOW (ref 4.22–5.81)
RDW: 16.7 % — ABNORMAL HIGH (ref 11.5–15.5)
WBC: 13.5 10*3/uL — ABNORMAL HIGH (ref 4.0–10.5)

## 2015-05-11 LAB — RENAL FUNCTION PANEL
ALBUMIN: 2.1 g/dL — AB (ref 3.5–5.0)
ANION GAP: 8 (ref 5–15)
BUN: 20 mg/dL (ref 6–20)
CALCIUM: 7.5 mg/dL — AB (ref 8.9–10.3)
CO2: 27 mmol/L (ref 22–32)
CREATININE: 0.98 mg/dL (ref 0.61–1.24)
Chloride: 110 mmol/L (ref 101–111)
Glucose, Bld: 137 mg/dL — ABNORMAL HIGH (ref 65–99)
PHOSPHORUS: 1.4 mg/dL — AB (ref 2.5–4.6)
Potassium: 3.9 mmol/L (ref 3.5–5.1)
SODIUM: 145 mmol/L (ref 135–145)

## 2015-05-11 LAB — BLOOD GAS, ARTERIAL
ACID-BASE DEFICIT: 0.8 mmol/L (ref 0.0–2.0)
BICARBONATE: 24.8 meq/L — AB (ref 20.0–24.0)
Drawn by: 257701
FIO2: 0.8
LHR: 24 {breaths}/min
O2 SAT: 86.5 %
PEEP: 10 cmH2O
PH ART: 7.307 — AB (ref 7.350–7.450)
PRESSURE CONTROL: 20 cmH2O
Patient temperature: 100.8
TCO2: 23.7 mmol/L (ref 0–100)
pCO2 arterial: 52 mmHg — ABNORMAL HIGH (ref 35.0–45.0)
pO2, Arterial: 61.2 mmHg — ABNORMAL LOW (ref 80.0–100.0)

## 2015-05-11 LAB — BASIC METABOLIC PANEL
Anion gap: 7 (ref 5–15)
BUN: 20 mg/dL (ref 6–20)
CO2: 27 mmol/L (ref 22–32)
Calcium: 7.4 mg/dL — ABNORMAL LOW (ref 8.9–10.3)
Chloride: 111 mmol/L (ref 101–111)
Creatinine, Ser: 1.01 mg/dL (ref 0.61–1.24)
GFR calc Af Amer: >60 mL/min (ref >60)
GFR calc non Af Amer: >60 mL/min (ref >60)
Glucose, Bld: 136 mg/dL — ABNORMAL HIGH (ref 65–99)
Potassium: 3.9 mmol/L (ref 3.5–5.1)
Sodium: 145 mmol/L (ref 135–145)

## 2015-05-11 LAB — CULTURE, RESPIRATORY

## 2015-05-11 LAB — CULTURE, RESPIRATORY W GRAM STAIN

## 2015-05-11 LAB — MAGNESIUM: Magnesium: 1.8 mg/dL (ref 1.7–2.4)

## 2015-05-11 LAB — TRIGLYCERIDES: TRIGLYCERIDES: 255 mg/dL — AB (ref <150)

## 2015-05-11 MED ORDER — ACETAMINOPHEN 325 MG PO TABS
650.0000 mg | ORAL_TABLET | Freq: Four times a day (QID) | ORAL | Status: DC | PRN
  Administered 2015-05-11 – 2015-05-22 (×9): 650 mg
  Filled 2015-05-11 (×9): qty 2

## 2015-05-11 MED ORDER — FREE WATER
200.0000 mL | Freq: Three times a day (TID) | Status: DC
  Administered 2015-05-12 – 2015-05-18 (×7): 200 mL

## 2015-05-11 MED ORDER — FUROSEMIDE 10 MG/ML IJ SOLN
40.0000 mg | Freq: Once | INTRAMUSCULAR | Status: AC
  Administered 2015-05-11: 40 mg via INTRAVENOUS
  Filled 2015-05-11: qty 4

## 2015-05-11 MED ORDER — VITAMINS A & D EX OINT
TOPICAL_OINTMENT | CUTANEOUS | Status: AC
  Administered 2015-05-11: 1
  Filled 2015-05-11: qty 5

## 2015-05-11 NOTE — Progress Notes (Signed)
eLink Physician-Brief Progress Note Patient Name: Eddie Williams DOB: [birth year] MRN: [id number]   Date of Service  05/11/2015  HPI/Events of Note  NSVT  eICU Interventions  Monitor      Intervention Category Major Interventions: Arrhythmia - evaluation and management  Billy Fischer 05/11/2015, 11:40 PM

## 2015-05-11 NOTE — Progress Notes (Signed)
eLink Physician-Brief Progress Note Patient Name: GIACOMO VALONE DOB: [birth year] MRN: [id number]   Date of Service  05/11/2015  HPI/Events of Note    eICU Interventions  Acetaminophen for fever     Intervention Category Minor Interventions: Routine modifications to care plan (e.g. PRN medications for pain, fever)  Billy Fischer 05/11/2015, 2:39 AM

## 2015-05-11 NOTE — Progress Notes (Signed)
eLink Physician-Brief Progress Note Patient Name: Eddie Williams DOB: 1971-01-25 MRN: [id number]   Date of Service  05/11/2015  HPI/Events of Note  Ventilator dyssynchrony  eICU Interventions  Vent changes made - PCV mode     Intervention Category Major Interventions: Respiratory failure - evaluation and management  Billy Fischer 05/11/2015, 1:42 AM

## 2015-05-11 NOTE — Progress Notes (Signed)
PULMONARY / CRITICAL CARE MEDICINE   Name: Eddie Williams MRN: [id number] DOB: 04-May-1971    ADMISSION DATE:  05/07/2015  REFERRING MD :  [facility]  CHIEF COMPLAINT:  PNA, Acute respiratory distress  INITIAL PRESENTATION: 44 y/o man, hx polysubstance abuse, seizures, COPD / asthma, presented with B infiltrates and altered MS 9/17. Possible toxic ingestion of benzos and narcs. Progressed to respiratory failure and required intubation and MV, then hypotension presumed septic shock.   STUDIES:  CXR 9/16 >> no infiltrates CXR 9/17 >> Bilateral infiltrates and consolidation, perihilar predominant but involving other fields as well CT Head 9/17 >> no acute intracranial abnormality. EEG 9/18 >> abnormal EEG with presence of generalized background slowing / encephalopathy.  NO seizures. Echo 9/18 >> LVEF 55-60%, Grade 2 Diastolic dysfunction, LAE, RV mod dilated, systolic function mildly reduced, RA mod dilated, PA pressure 53 mm Hg  SIGNIFICANT EVENTS: 9/17 - Intubation & Admission 9/17 - Worsening mental status & questionable seizure activity 9/19 - Self extubated x2, significant agitation  9/20 - ETT above cords, pt able to make sounds on NP exam.  Tube adjusted and FOB at bedside for placement, BAL.   9/21 Severe vent dyssynchrony, started on pressure control ventilation  SUBJECTIVE:  Severe vent dyssynchrony, started on pressure control ventilation   VITAL SIGNS: Temp:  [100.4 F (38 C)-103.5 F (39.7 C)] 102.6 F (39.2 C) (09/21 0645) Pulse Rate:  [80-108] 93 (09/21 0645) Resp:  [14-30] 24 (09/21 0645) BP: (84-137)/(38-78) 97/54 mmHg (09/21 0645) SpO2:  [80 %-100 %] 95 % (09/21 0645) FiO2 (%):  [50 %-100 %] 100 % (09/21 0800) Weight:  [184 lb 8.4 oz (83.7 kg)] 184 lb 8.4 oz (83.7 kg) (09/21 0400)   HEMODYNAMICS:     VENTILATOR SETTINGS: Vent Mode:  [-] PCV FiO2 (%):  [50 %-100 %] 100 % Set Rate:  [14 bmp-24 bmp] 24 bmp Vt Set:  [570 mL] 570 mL PEEP:  [10 cmH20] 10  cmH20 Plateau Pressure:  [22 cmH20-30 cmH20] 27 cmH20   INTAKE / OUTPUT:  Intake/Output Summary (Last 24 hours) at 05/11/15 0838 Last data filed at 05/11/15 0800  Gross per 24 hour  Intake 4348.83 ml  Output   1635 ml  Net 2713.83 ml    PHYSICAL EXAMINATION:  Gen: sedated on vent HENT: NCAT ETT in place PULM: crackles bilaterally, vent supported breaths CV: RRR, no mgr GI : BS+, soft, nontender MSK: BS+, soft, nontender Neuro: sedated on vent   LABS:  CBC  Recent Labs Lab 05/09/15 0445 05/10/15 0430 05/11/15 0405  WBC 9.6 15.6* 13.5*  HGB 8.4* 8.7* 8.3*  HCT 24.7* 26.3* 25.5*  PLT 115* 121* PLATELET CLUMPS NOTED ON SMEAR, UNABLE TO ESTIMATE   Coag's No results for input(s): APTT, INR in the last 168 hours.   BMET  Recent Labs Lab 05/10/15 0920 05/11/15 0405 05/11/15 0415  NA 141 145 145  K 3.6 3.9 3.9  CL 107 111 110  CO2 BUN 21* 20 20  CREATININE 0.92 1.01 0.98  GLUCOSE 100* 136* 137*   Electrolytes  Recent Labs Lab 05/09/15 0400 05/10/15 0430 05/10/15 0920 05/11/15 0405 05/11/15 0415  CALCIUM 7.0* 7.6* 7.4* 7.4* 7.5*  MG 1.9 2.0  --  1.8  --   PHOS 2.7 1.8*  --   --  1.4*   Sepsis Markers  Recent Labs Lab 05/07/15 1705 05/08/15 0230 05/08/15 1410 05/08/15 2000 05/09/15 0400  LATICACIDVEN 2.1* 3.4* 2.3* 2.4* 1.6  PROCALCITON 16.09 19.43  --   --  5.15   ABG  Recent Labs Lab 05/09/15 1359 05/09/15 1710 05/10/15 1255  PHART 7.490* 7.389 7.381  PCO2ART 32.5* 39.7 40.5  PO2ART 70.9* 157* 149*   Liver Enzymes  Recent Labs Lab 05/07/15 1250 05/08/15 0230 05/09/15 0400 05/10/15 0430 05/11/15 0415  AST 72* 67*  --   --   --   ALT 18 22  --   --   --   ALKPHOS 57 53  --   --   --   BILITOT 0.5 0.5  --   --   --   ALBUMIN 3.6 3.0* 2.3* 2.2* 2.1*   Cardiac Enzymes  Recent Labs Lab 05/07/15 1705 05/07/15 1706 05/08/15 0200  TROPONINI <0.03 <0.03 <0.03   Glucose  Recent Labs Lab 05/10/15 0821  05/10/15 1151 05/10/15 1619 05/10/15 2014 05/10/15 2337 05/11/15 0447  GLUCAP 89 99 118* 143* 99 125*    Imaging 9/20  CXR Images personally reviewed > ETT in place, diffuse bilateral airspace disease   ASSESSMENT / PLAN:  PULMONARY OETT 9/17 >> 9/19, 9/19>> A: ARDS> worse again, severe vent dyssynchrony Acute Hypercarbic Respiratory Failure HCAP vs aspiration PNA Self Extubation x2 - 9/19 > ETT adjusted based on bronchoscopy but now ETT is in R mainstem 9/21 P:   Assess for PETAL trial Continue PCV this morning > decrease FiO2, repeat ABG, may need to go back on ARDS protocol Diuresis when off of pressors Adjust ETT today Intermittent CXR Duoneb q4hr prn wheezing See ID   CARDIOVASCULAR CVL L IJ CVC 9/17 >>  A:  Shock - Septic > worsening but no new source identified Pulmonary hypertension suggested on echo > most likely due to acute vasoconstriction in setting of ARDS P:  Tele monitoring  Will need f/u echo as outpatient   RENAL A:   Acute Renal Failure due to rhabdomyolysis- Improving Hypokalemia Metabolic/Lactic Acidosis - Trending q6hr Hypomagnesemia - Replacing IV. P:   Trend BMP / UOP  Replace electrolytes as needed KVO fluids   GASTROINTESTINAL A:   Moderate Protein Calorie Malnutrition P:   Tube feeding per nutrition  Protonix IV q24hr  HEMATOLOGIC A:   Mild Anemia - No signs of active bleeding Mild Thrombocytopenia P:  Daily CBC Trend platelets daily w/ CBC SCDs Heparin [place] q8hr  INFECTIOUS A:   HCAP vs aspiration PNA > TMax 103.5 9/21 Fever - noted 9/19, tmax 102 P:    BCx2 9/17 >>  UC 9/17 >>  Resp 9/17 >> E-Coli >> sens to aztreonam FOB BAL 9/20 >>   Aztreonam 9/17 >>  Vanco 9/17 >> 9/19  ENDOCRINE A:   No acute issues P:   SSI  Accuchecks q4hr  NEUROLOGIC A:   Suicide Attempt w/ Overdose (benzo, narcotic, cocaine) Acute encephalopathy - Toxic Metabolic + Overdose. Head CT negative H/O Seizure disorder - EEG  9/19 without evidence of seizure Sedation for MV > needs high doses of fentanyl and versed and propofol EtOH abuse P:   Seizure precautions RASS goal: -2 Fentanyl gtt Versed gtt Propofol gtt > follow triglycerides Thiamine, folate Psychiatry evaluation post extubation   FAMILY  - Updates: Patient's mother updated at bedside 9/20  - Inter-disciplinary family meet or Palliative Care meeting due by: 05/14/15   My cc time 34 minutes  Heber [place], MD [facility] Pager: [phone] Cell: [phone] After 3pm or if no response, call [phone]

## 2015-05-11 NOTE — Progress Notes (Signed)
Pt had 17 beat run of V. Tach. Strip saved in chart. EKG shows NSR. Dr. Bard Herbert aware.

## 2015-05-11 NOTE — Progress Notes (Signed)
eLink Physician-Brief Progress Note Patient Name: Eddie Williams DOB: April 28, 1971 MRN: [id number]   Date of Service  05/11/2015  HPI/Events of Note  Oliguria - Urine output for last 6 hours averaging 16 mL/hour. CVP = 19.  eICU Interventions  Will order Lasix 40 mg IV now.      Intervention Category Intermediate Interventions: Oliguria - evaluation and management  Karolyne Timmons [place] 05/11/2015, 5:39 PM

## 2015-05-12 ENCOUNTER — Inpatient Hospital Stay (HOSPITAL_COMMUNITY): Payer: Medicare Other

## 2015-05-12 LAB — BLOOD GAS, ARTERIAL
ACID-BASE DEFICIT: 6.1 mmol/L — AB (ref 0.0–2.0)
BICARBONATE: 22.6 meq/L (ref 20.0–24.0)
Drawn by: 103701
FIO2: 1
LHR: 24 {breaths}/min
O2 SAT: 97.1 %
PATIENT TEMPERATURE: 100.8
PCO2 ART: 70.2 mmHg — AB (ref 35.0–45.0)
PEEP/CPAP: 10 cmH2O
Pressure control: 20 cmH2O
TCO2: 22.3 mmol/L (ref 0–100)
pH, Arterial: 7.144 — CL (ref 7.350–7.450)
pO2, Arterial: 119 mmHg — ABNORMAL HIGH (ref 80.0–100.0)

## 2015-05-12 LAB — RENAL FUNCTION PANEL
ALBUMIN: 1.9 g/dL — AB (ref 3.5–5.0)
ANION GAP: 10 (ref 5–15)
BUN: 41 mg/dL — AB (ref 6–20)
CALCIUM: 7.4 mg/dL — AB (ref 8.9–10.3)
CO2: 24 mmol/L (ref 22–32)
Chloride: 108 mmol/L (ref 101–111)
Creatinine, Ser: 2.91 mg/dL — ABNORMAL HIGH (ref 0.61–1.24)
GFR calc Af Amer: 29 mL/min — ABNORMAL LOW (ref >60)
GFR calc non Af Amer: 25 mL/min — ABNORMAL LOW (ref >60)
GLUCOSE: 122 mg/dL — AB (ref 65–99)
PHOSPHORUS: 3 mg/dL (ref 2.5–4.6)
Potassium: 4.4 mmol/L (ref 3.5–5.1)
SODIUM: 142 mmol/L (ref 135–145)

## 2015-05-12 LAB — CBC WITH DIFFERENTIAL/PLATELET
BASOS PCT: 0 %
Basophils Absolute: 0 10*3/uL (ref 0.0–0.1)
EOS ABS: 0.6 10*3/uL (ref 0.0–0.7)
Eosinophils Relative: 4 %
HEMATOCRIT: 25.8 % — AB (ref 39.0–52.0)
HEMOGLOBIN: 8.4 g/dL — AB (ref 13.0–17.0)
LYMPHS ABS: 1.6 10*3/uL (ref 0.7–4.0)
LYMPHS PCT: 11 %
MCH: 33.2 pg (ref 26.0–34.0)
MCHC: 32.6 g/dL (ref 30.0–36.0)
MCV: 102 fL — ABNORMAL HIGH (ref 78.0–100.0)
MONOS PCT: 7 %
Monocytes Absolute: 1 10*3/uL (ref 0.1–1.0)
NEUTROS ABS: 11.6 10*3/uL — AB (ref 1.7–7.7)
Neutrophils Relative %: 78 %
Platelets: 65 10*3/uL — ABNORMAL LOW (ref 150–400)
RBC: 2.53 MIL/uL — ABNORMAL LOW (ref 4.22–5.81)
RDW: 17.2 % — AB (ref 11.5–15.5)
WBC: 14.8 10*3/uL — ABNORMAL HIGH (ref 4.0–10.5)

## 2015-05-12 LAB — GLUCOSE, CAPILLARY
GLUCOSE-CAPILLARY: 112 mg/dL — AB (ref 65–99)
Glucose-Capillary: 130 mg/dL — ABNORMAL HIGH (ref 65–99)
Glucose-Capillary: 147 mg/dL — ABNORMAL HIGH (ref 65–99)
Glucose-Capillary: 149 mg/dL — ABNORMAL HIGH (ref 65–99)

## 2015-05-12 LAB — BASIC METABOLIC PANEL
ANION GAP: 9 (ref 5–15)
BUN: 40 mg/dL — AB (ref 6–20)
CALCIUM: 7.4 mg/dL — AB (ref 8.9–10.3)
CO2: 25 mmol/L (ref 22–32)
Chloride: 108 mmol/L (ref 101–111)
Creatinine, Ser: 2.91 mg/dL — ABNORMAL HIGH (ref 0.61–1.24)
GFR calc Af Amer: 29 mL/min — ABNORMAL LOW (ref >60)
GFR calc non Af Amer: 25 mL/min — ABNORMAL LOW (ref >60)
GLUCOSE: 123 mg/dL — AB (ref 65–99)
Potassium: 4.4 mmol/L (ref 3.5–5.1)
Sodium: 142 mmol/L (ref 135–145)

## 2015-05-12 LAB — CULTURE, BAL-QUANTITATIVE W GRAM STAIN
Colony Count: 10000
Gram Stain: NONE SEEN

## 2015-05-12 LAB — CULTURE, BAL-QUANTITATIVE: SPECIAL REQUESTS: NORMAL

## 2015-05-12 LAB — MAGNESIUM: MAGNESIUM: 1.8 mg/dL (ref 1.7–2.4)

## 2015-05-12 LAB — TRIGLYCERIDES: TRIGLYCERIDES: 237 mg/dL — AB (ref <150)

## 2015-05-12 LAB — CULTURE, BLOOD (ROUTINE X 2): CULTURE: NO GROWTH

## 2015-05-12 MED ORDER — PRO-STAT SUGAR FREE PO LIQD
30.0000 mL | Freq: Two times a day (BID) | ORAL | Status: DC
  Administered 2015-05-12 – 2015-05-18 (×7): 30 mL
  Filled 2015-05-12 (×8): qty 30

## 2015-05-12 MED ORDER — VITAL 1.5 CAL PO LIQD
1000.0000 mL | ORAL | Status: DC
  Administered 2015-05-12: 1000 mL
  Filled 2015-05-12 (×4): qty 1000

## 2015-05-12 MED ORDER — VANCOMYCIN HCL 10 G IV SOLR
1500.0000 mg | Freq: Once | INTRAVENOUS | Status: AC
  Administered 2015-05-12: 1500 mg via INTRAVENOUS
  Filled 2015-05-12: qty 1500

## 2015-05-12 MED ORDER — CHLORHEXIDINE GLUCONATE 0.12% ORAL RINSE (MEDLINE KIT)
15.0000 mL | Freq: Two times a day (BID) | OROMUCOSAL | Status: DC
Start: 2015-05-12 — End: 2015-05-12

## 2015-05-12 MED ORDER — SODIUM CHLORIDE 0.9 % IV SOLN
1.0000 g | Freq: Two times a day (BID) | INTRAVENOUS | Status: DC
  Administered 2015-05-12 (×2): 1 g via INTRAVENOUS
  Filled 2015-05-12 (×4): qty 1

## 2015-05-12 MED ORDER — VANCOMYCIN HCL 10 G IV SOLR
1250.0000 mg | INTRAVENOUS | Status: DC
  Filled 2015-05-12: qty 1250

## 2015-05-12 MED ORDER — SODIUM CHLORIDE 0.9 % IV SOLN
3.0000 mg/h | INTRAVENOUS | Status: DC
  Administered 2015-05-12 – 2015-05-13 (×3): 3 mg/h via INTRAVENOUS
  Administered 2015-05-13: 2 mg/h via INTRAVENOUS
  Administered 2015-05-14 – 2015-05-17 (×4): 3 mg/h via INTRAVENOUS
  Filled 2015-05-12 (×8): qty 5

## 2015-05-12 MED ORDER — FUROSEMIDE 10 MG/ML IJ SOLN
120.0000 mg | Freq: Three times a day (TID) | INTRAVENOUS | Status: DC
  Administered 2015-05-12 – 2015-05-15 (×6): 120 mg via INTRAVENOUS
  Filled 2015-05-12 (×2): qty 12
  Filled 2015-05-12: qty 10
  Filled 2015-05-12 (×5): qty 12
  Filled 2015-05-12: qty 2
  Filled 2015-05-12: qty 10
  Filled 2015-05-12: qty 12
  Filled 2015-05-12 (×2): qty 10
  Filled 2015-05-12: qty 12

## 2015-05-12 MED ORDER — METHADONE HCL 5 MG PO TABS
5.0000 mg | ORAL_TABLET | Freq: Two times a day (BID) | ORAL | Status: DC
  Administered 2015-05-12 – 2015-05-24 (×23): 5 mg via ORAL
  Filled 2015-05-12 (×24): qty 1

## 2015-05-12 MED ORDER — ANTISEPTIC ORAL RINSE SOLUTION (CORINZ)
7.0000 mL | Freq: Four times a day (QID) | OROMUCOSAL | Status: DC
  Administered 2015-05-12 – 2015-05-17 (×19): 7 mL via OROMUCOSAL

## 2015-05-12 NOTE — Progress Notes (Signed)
Nutrition Follow-up  DOCUMENTATION CODES:   Non-severe (moderate) malnutrition in context of chronic illness  INTERVENTION:   Decrease Vital 1.5 @ 65 ml/hr to goal rate of 45 ml/hr.   30 ml Prostat BID.    Tube feeding regimen + current Propofol infusion provides 2285 kcal (100% of needs), 103 grams of protein (94% of needs), and 825 ml of H2O.   RD to continue to monitor  NUTRITION DIAGNOSIS:   Inadequate oral intake related to inability to eat as evidenced by NPO status.  Ongoing.  GOAL:   Patient will meet greater than or equal to 90% of their needs  Meeting.  MONITOR:   Vent status, TF tolerance, Weight trends, Labs, Skin, I & O's  ASSESSMENT:   44 yo man, hx polysubstance abuse, seizures, COPD / asthma, presented with B infiltrates and altered MS 9/17. Possible toxic ingestion of benzos and narcs. Progressed to respiratory failure and required intubation and MV, then hypotension presumed septic shock.  Per nephrology, pt may need CRRT. Will adjust TF as warranted.  Patient is currently intubated on ventilator support MV: 14.3 L/min Temp (24hrs), Avg:101.3 F (38.5 C), Min:99.7 F (37.6 C), Max:102.4 F (39.1 C)  Propofol: 17.6 ml/hr -> provides 465 fat kcal  Labs reviewed: Elevated BUN & Creatinine  Diet Order:     Skin:  Wound (see comment) (R knee and R elbow wounds)  Last BM:  PTA  Height:   Ht Readings from Last 1 Encounters:  05/07/15  (1.753 m)    Weight:   Wt Readings from Last 1 Encounters:  05/12/15 199 lb 8.3 oz (90.5 kg)    Ideal Body Weight:  72.7 kg  BMI:  Body mass index is 29.45 kg/(m^2).  Estimated Nutritional Needs:   Kcal:  2215  Protein:  110-120g  Fluid:  2L/day  EDUCATION NEEDS:   No education needs identified at this time  Tilda Franco, MS, RD, LDN Pager: [phone] After Hours Pager: [phone]

## 2015-05-12 NOTE — Progress Notes (Signed)
Date:  Sept. 22, 2016 U.R. performed for needs and level of care. Remain on vent at full support. Will continue to follow for Case Management needs.  Marcelle Smiling, RN, BSN, [place]   [phone]

## 2015-05-12 NOTE — Progress Notes (Signed)
Fentanyl drip dc'd 80 cc (37mcg/ml)wasted in sink witness Len Blalock RN/ Caesar Chestnut RN

## 2015-05-12 NOTE — Progress Notes (Signed)
PULMONARY / CRITICAL CARE MEDICINE   Name: Eddie Williams MRN: [id number] DOB: Sep 17, 1970    ADMISSION DATE:  05/07/2015  REFERRING MD :  [facility]  CHIEF COMPLAINT:  PNA, Acute respiratory distress  INITIAL PRESENTATION: 44 y/o man, hx polysubstance abuse, seizures, COPD / asthma, presented with B infiltrates and altered MS 9/17. Possible toxic ingestion of benzos and narcs. Progressed to respiratory failure and required intubation and MV, then hypotension presumed septic shock.   STUDIES:  CXR 9/16 >> no infiltrates CXR 9/17 >> Bilateral infiltrates and consolidation, perihilar predominant but involving other fields as well CT Head 9/17 >> no acute intracranial abnormality. EEG 9/18 >> abnormal EEG with presence of generalized background slowing / encephalopathy.  NO seizures. Echo 9/18 >> LVEF 55-60%, Grade 2 Diastolic dysfunction, LAE, RV mod dilated, systolic function mildly reduced, RA mod dilated, PA pressure 53 mm Hg  SIGNIFICANT EVENTS: 9/17  Intubation & Admission 9/17  Worsening mental status & questionable seizure activity 9/19  Self extubated x2, significant agitation  9/20  ETT above cords, pt able to make sounds on NP exam.  Tube adjusted and FOB at bedside for placement, BAL.   9/21  Severe vent dyssynchrony, started on pressure control ventilation 9/22  Sedate on 400 mcg fent, 8 versed, 48 propofol   SUBJECTIVE:   RN reports rising sr creatinine, decreased UOP, QTc 0.38.  Hypotension on 18 mcg levophed.  PCV 20 with volumes 500's.  Run of VT overnight.  Spontaneous desaturation to 70's ~ 10 am.  Increased fiO2, PEEP @ 10.  Pending CXR.    VITAL SIGNS: Temp:  [100.9 F (38.3 C)-102.4 F (39.1 C)] 100.9 F (38.3 C) (09/22 0730) Pulse Rate:  [81-102] 97 (09/22 0730) Resp:  [17-27] 18 (09/22 0730) BP: (91-113)/(43-61) 106/54 mmHg (09/22 0730) SpO2:  [83 %-100 %] 95 % (09/22 0730) FiO2 (%):  [80 %-100 %] 80 % (09/22 0912) Weight:  [199 lb 8.3 oz (90.5 kg)] 199 lb 8.3  oz (90.5 kg) (09/22 0400)   HEMODYNAMICS: CVP:  [19 mmHg] 19 mmHg   VENTILATOR SETTINGS: Vent Mode:  [-] PCV FiO2 (%):  [80 %-100 %] 80 % Set Rate:  [24 bmp] 24 bmp PEEP:  [10 cmH20] 10 cmH20 Plateau Pressure:  [24 cmH20-30 cmH20] 29 cmH20   INTAKE / OUTPUT:  Intake/Output Summary (Last 24 hours) at 05/12/15 0934 Last data filed at 05/12/15 0745  Gross per 24 hour  Intake 5268.37 ml  Output    265 ml  Net 5003.37 ml    PHYSICAL EXAMINATION: Gen: young adult male in NAD, sedated on vent HENT: NCAT, ETT in place PULM:  Even/non-labored on MV, lungs bilaterally with coarse rhonchi, basilar crackles  CV: RRR, no mgr GI : BS+, soft, nontender MSK: no acute deformities Neuro: sedated on vent   LABS:  CBC  Recent Labs Lab 05/10/15 0430 05/11/15 0405 05/12/15 0405  WBC 15.6* 13.5* 14.8*  HGB 8.7* 8.3* 8.4*  HCT 26.3* 25.5* 25.8*  PLT 121* PLATELET CLUMPS NOTED ON SMEAR, UNABLE TO ESTIMATE 65*   Coag's No results for input(s): APTT, INR in the last 168 hours.   BMET  Recent Labs Lab 05/11/15 0405 05/11/15 0415 05/12/15 0405  NA 145 145 142  142  K 3.9 3.9 4.4  4.4  CL 111 110 108  108  CO2 BUN 20 20 40*  41*  CREATININE 1.01 0.98 2.91*  2.91*  GLUCOSE 136* 137* 123*  122*  Electrolytes  Recent Labs Lab 05/10/15 0430  05/11/15 0405 05/11/15 0415 05/12/15 0405  CALCIUM 7.6*  < > 7.4* 7.5* 7.4*  7.4*  MG 2.0  --  1.8  --  1.8  PHOS 1.8*  --   --  1.4* 3.0  < > = values in this interval not displayed. Sepsis Markers  Recent Labs Lab 05/07/15 1705 05/08/15 0230 05/08/15 1410 05/08/15 2000 05/09/15 0400  LATICACIDVEN 2.1* 3.4* 2.3* 2.4* 1.6  PROCALCITON 16.09 19.43  --   --  5.15   ABG  Recent Labs Lab 05/09/15 1710 05/10/15 1255 05/11/15 1058  PHART 7.389 7.381 7.307*  PCO2ART 39.7 40.5 52.0*  PO2ART 157* 149* 61.2*   Liver Enzymes  Recent Labs Lab 05/07/15 1250 05/08/15 0230  05/10/15 0430  05/11/15 0415 05/12/15 0405  AST 72* 67*  --   --   --   --   ALT 18 22  --   --   --   --   ALKPHOS 57 53  --   --   --   --   BILITOT 0.5 0.5  --   --   --   --   ALBUMIN 3.6 3.0*  < > 2.2* 2.1* 1.9*  < > = values in this interval not displayed. Cardiac Enzymes  Recent Labs Lab 05/07/15 1705 05/07/15 1706 05/08/15 0200  TROPONINI <0.03 <0.03 <0.03   Glucose  Recent Labs Lab 05/11/15 1232 05/11/15 1616 05/11/15 1932 05/11/15 2319 05/12/15 0318 05/12/15 0744  GLUCAP 125* 156* 112* 117* 130* 147*    Imaging 9/22  CXR Images personally reviewed > ETT 5 cm above the carina, diffuse bilateral airspace disease   ASSESSMENT / PLAN:  PULMONARY OETT 9/17 >> 9/19, 9/19>> A: ARDS-  worse again, severe vent dyssynchrony.  Improved compliance 9/22. Acute Hypercarbic Respiratory Failure HCAP vs aspiration PNA Self Extubation x2 - 9/19, bronch for placement but was in R mainstem.  CXR reviewed/measured 9/22, adjusted to 26 at teeth P:   Assess for PETAL trial Continue PCV 20 Daily ABG Diuresis when off of pressors Adjust ETT to 26 cm 9/22 Intermittent CXR Duoneb q4hr prn wheezing See ID Repeat CXR now to ensure no new findings / ptx with desaturation  CARDIOVASCULAR CVL L IJ CVC 9/17 >>  A:  Shock - Septic, worsening shock but no new source identified Pulmonary hypertension suggested on echo - most likely due to acute vasoconstriction in setting of ARDS VT - short run noted overnight 9/22 P:  Tele monitoring  Will need f/u echo as outpatient Levophed for MAP >65 Assess Qtc, reviewed & Ok for methadone  RENAL A:   Acute Renal Failure - due to rhabdomyolysis & shock  Hypokalemia Hypernatremia Metabolic/Lactic Acidosis - trending q6hr Hypomagnesemia - Replacing IV. P:   Trend BMP / UOP  Assess foley placement  Replace electrolytes as needed May need Nephrology consult with rising sr cr and oliguria KVO fluids Free water, 200 ml  Q8   GASTROINTESTINAL A:   Moderate Protein Calorie Malnutrition P:   Tube feeding per nutrition  Protonix IV q24hr D/C Pepcid with thrombocytopenia   HEMATOLOGIC A:   Mild Anemia - No signs of active bleeding Thrombocytopenia - suspect in setting of sepsis but also must consider HIT with heparin  P:  Daily CBC Trend platelets daily w/ CBC D/C pepcid with thrombocytopenia SCDs Heparin [place] q8hr Consider d/c heparin if platelets continue to decrease 9/23  INFECTIOUS A:   HCAP vs aspiration PNA >  TMax 103.5 9/21 Fever - noted 9/19, tmax 102 P:    BCx2 9/17 >>  UC 9/17 >> neg Resp 9/17 >> E-Coli >> sens to aztreonam/imipenem FOB BAL 9/20 >> few yeast  Aztreonam 9/17 >> 9/22 Vanco 9/17 >> 9/19  Vanco 9/22 >>  Merropenem 9/22 >>    ENDOCRINE A:   Hyperglycemia - in setting of acute critical illness P:   SSI  Accuchecks q4hr  NEUROLOGIC A:   Suicide Attempt w/ Overdose (benzo, narcotic, cocaine) Acute encephalopathy - Toxic Metabolic + Overdose. Head CT negative Agitated Delirium / Difficult to achieve sedation Sedation for MV > needs high doses of fentanyl and versed and propofol H/O Seizure disorder - EEG 9/19 without evidence of seizure EtOH abuse P:   Seizure precautions RASS goal: -2 Initiate methadone 5 mg BID Dilaudid gtt initiated 9/22 out of concern for tachyphylaxis  D/c Fentanyl gtt once dilaudid gtt started Versed gtt Propofol gtt > follow triglycerides, wean to off as tolerated, discussed with RN.  Hopeful to get off by 9/23 with methadone. Thiamine, folate Psychiatry evaluation post extubation   FAMILY  - Updates: Patient's mother updated at bedside 9/22 per Dr. Kendrick Fries.   - Inter-disciplinary family meet or Palliative Care meeting due by: 05/14/15    Canary Brim, NP-C [place] Pgr: [phone] or if no answer [phone] 05/12/2015, 10:02 AM

## 2015-05-12 NOTE — Progress Notes (Addendum)
ANTIBIOTIC CONSULT NOTE - FOLLOW UP  Pharmacy Consult for Vancomycin and Meropenem Indication: HCAP  Allergies  Allergen Reactions  . Penicillins Anaphylaxis  . Ibuprofen Nausea Only  . Nsaids Nausea And Vomiting  . Erythromycin Rash    Patient Measurements: Height:  (175.3 cm) Weight: 199 lb 8.3 oz (90.5 kg) IBW/kg (Calculated) : 70.7  Vital Signs: Temp: 100.8 F (38.2 C) (09/22 1003) Temp Source: Core (Comment) (09/22 0600) BP: 116/52 mmHg (09/22 1000) Pulse Rate: 90 (09/22 1003) Intake/Output from previous day: 09/21 0701 - 09/22 0700 In: 5344.2 [I.V.:2359.2; NG/GT:2785; IV Piggyback:200] Out: 305 [Urine:305] Intake/Output from this shift: Total I/O In: 189.2 [I.V.:75.4; NG/GT:113.8] Out: -   Labs:  Recent Labs  05/10/15 0430  05/11/15 0405 05/11/15 0415 05/12/15 0405  WBC 15.6*  --  13.5*  --  14.8*  HGB 8.7*  --  8.3*  --  8.4*  PLT 121*  --  PLATELET CLUMPS NOTED ON SMEAR, UNABLE TO ESTIMATE  --  65*  CREATININE 1.11  < > 1.01 0.98 2.91*  2.91*  < > = values in this interval not displayed. Estimated Creatinine Clearance: 36.4 mL/min (by C-G formula based on Cr of 2.91). No results for input(s): VANCOTROUGH, VANCOPEAK, VANCORANDOM, GENTTROUGH, GENTPEAK, GENTRANDOM, TOBRATROUGH, TOBRAPEAK, TOBRARND, AMIKACINPEAK, AMIKACINTROU, AMIKACIN in the last 72 hours.   Microbiology: Recent Results (from the past 720 hour(s))  Culture, respiratory (NON-Expectorated)     Status: None   Collection Time: 05/07/15 11:54 AM  Result Value Ref Range Status   Specimen Description SPUTUM  Final   Special Requests NONE  Final   Gram Stain   Final    FEW WBC PRESENT, PREDOMINANTLY PMN FEW SQUAMOUS EPITHELIAL CELLS PRESENT MODERATE GRAM NEGATIVE RODS FEW GRAM POSITIVE RODS FEW YEAST    Culture   Final    ABUNDANT ESCHERICHIA COLI Performed at [facility]    Report Status 05/11/2015 FINAL  Final   Organism ID, Bacteria ESCHERICHIA COLI  Final       Susceptibility   Escherichia coli - MIC*    AMPICILLIN >=32 RESISTANT Resistant     AMPICILLIN/SULBACTAM 4 SENSITIVE Sensitive     CEFEPIME <=1 SENSITIVE Sensitive     CEFTAZIDIME <=1 SENSITIVE Sensitive     CEFTRIAXONE <=1 SENSITIVE Sensitive     CIPROFLOXACIN >=4 RESISTANT Resistant     GENTAMICIN <=1 SENSITIVE Sensitive     IMIPENEM <=0.25 SENSITIVE Sensitive     PIP/TAZO <=4 SENSITIVE Sensitive     TOBRAMYCIN <=1 SENSITIVE Sensitive     TRIMETH/SULFA <=20 SENSITIVE Sensitive     AZTREONAM <=1 SENSITIVE Sensitive     * ABUNDANT ESCHERICHIA COLI  Culture, sputum-assessment     Status: None   Collection Time: 05/07/15  2:17 PM  Result Value Ref Range Status   Specimen Description SPUTUM  Final   Special Requests Immunocompromised  Final   Sputum evaluation   Final    THIS SPECIMEN IS ACCEPTABLE. RESPIRATORY CULTURE REPORT TO FOLLOW.   Report Status 05/07/2015 FINAL  Final  Urine culture     Status: None   Collection Time: 05/07/15  3:11 PM  Result Value Ref Range Status   Specimen Description URINE, CLEAN CATCH  Final   Special Requests Immunocompromised  Final   Culture   Final    NO GROWTH 2 DAYS Performed at [facility]    Report Status 05/09/2015 FINAL  Final  Blood Culture (routine x 2)     Status: None (Preliminary result)  Collection Time: 05/07/15  5:00 PM  Result Value Ref Range Status   Specimen Description BLOOD CENTRAL LINE  Final   Special Requests BOTTLES DRAWN AEROBIC AND ANAEROBIC 5 CC EACH  Final   Culture   Final    NO GROWTH 4 DAYS Performed at [facility]    Report Status PENDING  Incomplete  Blood Culture (routine x 2)     Status: None (Preliminary result)   Collection Time: 05/07/15  5:50 PM  Result Value Ref Range Status   Specimen Description BLOOD RIGHT ARM  Final   Special Requests   Final    BOTTLES DRAWN AEROBIC AND ANAEROBIC IMMUNOCOMPROMISED   Culture   Final    NO GROWTH 3 DAYS Performed at [facility]     Report Status PENDING  Incomplete  MRSA PCR Screening     Status: None   Collection Time: 05/07/15  6:46 PM  Result Value Ref Range Status   MRSA by PCR NEGATIVE NEGATIVE Final    Comment:        The GeneXpert MRSA Assay (FDA approved for NASAL specimens only), is one component of a comprehensive MRSA colonization surveillance program. It is not intended to diagnose MRSA infection nor to guide or monitor treatment for MRSA infections.   Culture, bal-quantitative     Status: None   Collection Time: 05/10/15 11:32 AM  Result Value Ref Range Status   Specimen Description BRONCHIAL ALVEOLAR LAVAGE  Final   Special Requests Normal  Final   Gram Stain   Final    NO WBC SEEN NO SQUAMOUS EPITHELIAL CELLS SEEN NO ORGANISMS SEEN Performed at [facility]   Final    10,000 COLONIES/ML Performed at [facility]    Culture   Final    YEAST CONSISTENT WITH CANDIDA SPECIES Performed at [facility]    Report Status 05/12/2015 FINAL  Final    Anti-infectives    Start     Dose/Rate Route Frequency Ordered Stop   05/10/15 1800  aztreonam (AZACTAM) 2 g in dextrose 5 % 50 mL IVPB  Status:  Discontinued     2 g 100 mL/hr over 30 Minutes Intravenous Every 8 hours 05/10/15 1300 05/12/15 0935   05/08/15 1530  vancomycin (VANCOCIN) 1,250 mg in sodium chloride 0.9 % 250 mL IVPB     1,250 mg 166.7 mL/hr over 90 Minutes Intravenous  Once 05/08/15 1509 05/08/15 1646   05/08/15 0200  aztreonam (AZACTAM) 1 g in dextrose 5 % 50 mL IVPB  Status:  Discontinued     1 g 100 mL/hr over 30 Minutes Intravenous Every 8 hours 05/07/15 1504 05/10/15 1300   05/07/15 1400  aztreonam (AZACTAM) 2 g in dextrose 5 % 50 mL IVPB     2 g 100 mL/hr over 30 Minutes Intravenous  Once 05/07/15 1354 05/07/15 1529   05/07/15 1400  vancomycin (VANCOCIN) IVPB 1000 mg/200 mL premix     1,000 mg 200 mL/hr over 60 Minutes Intravenous  Once 05/07/15 1354 05/07/15 1529       Assessment: 44yo M w/ SOB and wheezing (possible ingestion BZDs) Recent admission and ED visits with similar sxs, but left AMA on 9/16. CXR on 9/16 was clear, CXR ion 9/17 shows HCAP + ALI vs pulm edema. Pharmacy dosed Vanc and Aztreonam for HCAP. First doses given in the ED. Broadened to Vanc/Mero as pt has seen no imp. on Azactam 1g or 2g dose,  renal function worsened, WBCs trend up, fevers continue, inc pressor need, unchanged CXR. Weight was 76kg one month ago. SCr 5.33 on admission, now 2.91, CrCl ~36.  Today, 05/12/2015: Temp: Continues to spike, most recent 101.3 WBC: Elevated 14.8, trending up Renal: AKI back again, CrCl 36 CG; UOP poor today CXR: persistent opacities; ARDS vs edema  9/17 >> Aztreonam >> 9/22 9/17 >> Vanc >> 9/19; restart 9/22 9/22 >> Meropenem >>  9/17 blood x2: NGTD 9/17 urine: NGF 9/17 sputum: abundant E-coli >> S: rocephin, imipenem, bactrim, aztreonam. R: cipro, ampicillin 9/20 BAL: 10k yeast  Dose changes/levels: - See earlier notes for vancomycin dosing - previously dosing by levels with earlier AKI 9/21: Aztreonam inc to 2g IV q8h based on imp. renal function, body wt, and new fevers/leukocytosis  Goal of Therapy:  Vancomycin trough level 15-20 mcg/ml  Appropriate antibiotic dosing for renal function; eradication of infection  Plan:   Stop Aztreonam Vancomycin 1500 mg IV now, then 1250 mg IV q24 hr  Measure vancomycin trough levels at steady state as indicated  Meropenem 1g q12h, Start w/ 10mL wait 30 min then give remainder of 1st bag over 2 hours to watch for possible allergic reactions.  Patient is intubated with pressor support, CCM MD aware  Follow renal function, clinical course, and cultures as available Follow for de-escalation and duration of tx  Charolette Child PharmD Candidate 05/12/2015,10:17 AM     I have reveiwed the above and agree with the student's plan  Bernadene Person, PharmD, BCPS Pager: [phone] 05/12/2015, 11:30  AM

## 2015-05-12 NOTE — Consult Note (Signed)
Renal Service Consult Note [facility]  Eddie Williams 05/12/2015 Eddie Williams D Requesting Physician: Dr Delton Coombes  Reason for Consult:  Acute renal failure HPI: The patient is a 44 y.o. year-old with hx of chronic back pain, seizures, COPD, HTN and asthma admitted on 9/17 with cough, SOB x several weeks, chest pains from coughing, recent Rx for PNA.  Stopped drinking etoh several weeks prior. CXR showed bilat infiltrates.  Pt had AMS, progressed to resp failure and intubation. Then developed septic shock admitted to ICU. Question of substance abuse. Creat was 1.7 on admission, went up to 5.3 on 9/18 then improved slowly down to 0.98 yesterday 9/21. Today however, creat is up to 2.91 and we are asked to see the patient.   UOP has been good until yesterday down to 300 cc. I/O since admission is 17 kg positive. Weight is up from 75 to 90 kg.  Patient on vent , sedated. BP 96/47 on levophed drip at 20 mcg/ min. Has not rec'd any IV contrast/ ACEi/ ARB or nsaids. Has rec'd 3 doses of IV vanc over 5 days, also IV azactam, lots of IV propofol and pressors.   Past Medical History  Past Medical History  Diagnosis Date  . Anxiety   . Chronic back pain   . Seizures   . COPD (chronic obstructive pulmonary disease)   . Hypertension   . Kidney stones   . Asthma    Past Surgical History  Past Surgical History  Procedure Laterality Date  . Back surgery    . Appendectomy    . Ankle surgery     Family History  Family History  Problem Relation Age of Onset  . CAD Other   . Cancer Other   . Diabetes Other   . Diabetes type II Mother   . Depression Mother   . CAD Father    Social History  reports that he has been smoking Cigarettes.  He has been smoking about 0.50 packs per day. He does not have any smokeless tobacco history on file. He reports that he drinks alcohol. He reports that he uses illicit drugs ("Crack" cocaine and Marijuana). Allergies  Allergies  Allergen Reactions   . Penicillins Anaphylaxis  . Ibuprofen Nausea Only  . Nsaids Nausea And Vomiting  . Erythromycin Rash   Home medications Prior to Admission medications   Medication Sig Start Date End Date Taking? Authorizing Ikeisha Blumberg  acetaminophen (TYLENOL) 500 MG tablet Take 1,000 mg by mouth every 6 (six) hours as needed for moderate pain.    Yes Historical Gwenivere Hiraldo, MD  albuterol (PROVENTIL HFA;VENTOLIN HFA) 108 (90 BASE) MCG/ACT inhaler Inhale 2 puffs into the lungs every 6 (six) hours as needed for wheezing or shortness of breath.   Yes Historical Camdynn Maranto, MD  albuterol (PROVENTIL) (2.5 MG/3ML) 0.083% nebulizer solution Take 2.5 mg by nebulization every 6 (six) hours as needed for wheezing or shortness of breath.   Yes Historical Jehan Bonano, MD  atenolol-chlorthalidone (TENORETIC) 100-25 MG per tablet Take 1 tablet by mouth daily. 04/04/15   Historical Loran Auguste, MD  buPROPion (WELLBUTRIN XL) 300 MG 24 hr tablet Take 300 mg by mouth every morning.     Historical Bela Bonaparte, MD  clonazePAM (KLONOPIN) 1 MG tablet Take 1 mg by mouth 2 (two) times daily as needed for anxiety.     Historical Contrell Ballentine, MD  fluticasone (FLONASE) 50 MCG/ACT nasal spray Place 1-2 sprays into both nostrils daily as needed for allergies or rhinitis.     Historical  Amelie Caracci, MD  gabapentin (NEURONTIN) 300 MG capsule Take 300 mg by mouth 3 (three) times daily.    Historical Jassiah Viviano, MD  HYDROcodone-acetaminophen (NORCO) 10-325 MG per tablet Take 1 tablet by mouth every 6 (six) hours as needed for moderate pain.  01/27/15   Historical Latice Waitman, MD  lisinopril-hydrochlorothiazide (PRINZIDE,ZESTORETIC) 10-12.5 MG per tablet  05/06/15   Historical Levy Cedano, MD  loratadine (CLARITIN) 10 MG tablet Take 10 mg by mouth every morning.     Historical Berlin Mokry, MD  omeprazole (PRILOSEC) 20 MG capsule Take 20 mg by mouth daily.    Historical Loney Peto, MD  sertraline (ZOLOFT) 100 MG tablet Take 100 mg by mouth every morning.     Historical Cassidy Tabet, MD    Liver Function Tests  Recent Labs Lab 05/07/15 1250 05/08/15 0230  05/10/15 0430 05/11/15 0415 05/12/15 0405  AST 72* 67*  --   --   --   --   ALT 18 22  --   --   --   --   ALKPHOS 57 53  --   --   --   --   BILITOT 0.5 0.5  --   --   --   --   PROT 6.9 5.8*  --   --   --   --   ALBUMIN 3.6 3.0*  < > 2.2* 2.1* 1.9*  < > = values in this interval not displayed. No results for input(s): LIPASE, AMYLASE in the last 168 hours. CBC  Recent Labs Lab 05/10/15 0430 05/11/15 0405 05/12/15 0405  WBC 15.6* 13.5* 14.8*  NEUTROABS 13.2* 10.6* 11.6*  HGB 8.7* 8.3* 8.4*  HCT 26.3* 25.5* 25.8*  MCV 98.9 100.4* 102.0*  PLT 121* PLATELET CLUMPS NOTED ON SMEAR, UNABLE TO ESTIMATE 65*   Basic Metabolic Panel  Recent Labs Lab 05/08/15 0230 05/09/15 0400 05/10/15 0430 05/10/15 0920 05/11/15 0405 05/11/15 0415 05/12/15 0405  NA 136 140 142 141 145 145 142  142  K 4.4 3.4* 3.7 3.6 3.9 3.9 4.4  4.4  CL 101 104 107 107 111 110 108  108  CO2 21* GLUCOSE 133* 103* 79 100* 136* 137* 123*  122*  BUN 70* 48* 22* 21* 20 20 40*  41*  CREATININE 4.43* 1.91* 1.11 0.92 1.01 0.98 2.91*  2.91*  CALCIUM 7.1* 7.0* 7.6* 7.4* 7.4* 7.5* 7.4*  7.4*  PHOS 3.6 2.7 1.8*  --   --  1.4* 3.0    Filed Vitals:   05/12/15 1124 05/12/15 1130 05/12/15 1200 05/12/15 1300  BP: 113/50 108/53 106/50 112/53  Pulse: 85 83 83 85  Temp:  99.7 F (37.6 C) 100.9 F (38.3 C) 101.1 F (38.4 C)  TempSrc:      Resp: Height:      Weight:      SpO2: 98% 99% 99% 96%   Exam On vent, sedated No rash, cyanosis or gangrene Sclera anicteric, throat w ETT ++JVD Chest occ basilar rales bilat RRR soft M, no RG Abd soft ntnd slightly distended +BS no ascites GU foley with cloudy urine, small amounts Diffuse 2+ pitting edema of LE's /UE's  Neuro is sedated on the vent  UA 9/17 > 7-10 rbc, 0-2 wbc, prot 30 Urine sediment / dip (undersigned ) > 2+blood/ trace prot/ 1.030,  sediment with diffuse granular casts, lots of debris, 3-5 wbc/rbc per hpf, no cellular casts CXR bilat opacities c/w edema vs pna  Assessment: 1. Acute kidney injury - oliguric, on pressors for shock. Sediment/ clinical course c/w ATN.  2. PNA / septic shock 3. Volume overload / pulm edema 4.  Hx COPD 5. Hx seizures 6. Hx chronic back pain 7. Hx HTN, was taking acei/ thiazide/ atenolol at home   Plan- IV lasix ordered but not optimistic it will help w vol overload. If does not improve will need CRRT.   Vinson Moselle MD (pgr) [phone]    [phone] 05/12/2015, 2:31 PM

## 2015-05-13 ENCOUNTER — Inpatient Hospital Stay (HOSPITAL_COMMUNITY): Payer: Medicare Other

## 2015-05-13 LAB — RENAL FUNCTION PANEL
ALBUMIN: 1.6 g/dL — AB (ref 3.5–5.0)
ANION GAP: 13 (ref 5–15)
ANION GAP: 10 (ref 5–15)
Albumin: 1.6 g/dL — ABNORMAL LOW (ref 3.5–5.0)
BUN: 64 mg/dL — ABNORMAL HIGH (ref 6–20)
BUN: 62 mg/dL — AB (ref 6–20)
CALCIUM: 7.4 mg/dL — AB (ref 8.9–10.3)
CHLORIDE: 105 mmol/L (ref 101–111)
CO2: 24 mmol/L (ref 22–32)
CO2: 24 mmol/L (ref 22–32)
CREATININE: 3.89 mg/dL — AB (ref 0.61–1.24)
Calcium: 7.2 mg/dL — ABNORMAL LOW (ref 8.9–10.3)
Chloride: 103 mmol/L (ref 101–111)
Creatinine, Ser: 4.53 mg/dL — ABNORMAL HIGH (ref 0.61–1.24)
GFR calc Af Amer: 20 mL/min — ABNORMAL LOW (ref >60)
GFR calc non Af Amer: 15 mL/min — ABNORMAL LOW (ref >60)
GFR calc non Af Amer: 18 mL/min — ABNORMAL LOW (ref >60)
GFR, EST AFRICAN AMERICAN: 17 mL/min — AB (ref >60)
GLUCOSE: 178 mg/dL — AB (ref 65–99)
Glucose, Bld: 124 mg/dL — ABNORMAL HIGH (ref 65–99)
PHOSPHORUS: 3.9 mg/dL (ref 2.5–4.6)
POTASSIUM: 4.9 mmol/L (ref 3.5–5.1)
Phosphorus: 3.8 mg/dL (ref 2.5–4.6)
Potassium: 4.5 mmol/L (ref 3.5–5.1)
SODIUM: 137 mmol/L (ref 135–145)
Sodium: 142 mmol/L (ref 135–145)

## 2015-05-13 LAB — BLOOD GAS, ARTERIAL
Acid-base deficit: 7.4 mmol/L — ABNORMAL HIGH (ref 0.0–2.0)
Acid-base deficit: 5.5 mmol/L — ABNORMAL HIGH (ref 0.0–2.0)
Acid-base deficit: 4.1 mmol/L — ABNORMAL HIGH (ref 0.0–2.0)
Acid-base deficit: 2.8 mmol/L — ABNORMAL HIGH (ref 0.0–2.0)
BICARBONATE: 22.6 meq/L (ref 20.0–24.0)
BICARBONATE: 21.2 meq/L (ref 20.0–24.0)
BICARBONATE: 22.3 meq/L (ref 20.0–24.0)
Bicarbonate: 21.3 mEq/L (ref 20.0–24.0)
Drawn by: 307971
Drawn by: 11249
FIO2: 0.8
FIO2: 0.7
FIO2: 0.6
FIO2: 0.5
LHR: 35 {breaths}/min
LHR: 35 {breaths}/min
MECHVT: 490 mL
O2 SAT: 94.2 %
O2 SAT: 90.9 %
O2 SAT: 98.6 %
O2 SAT: 94.7 %
PATIENT TEMPERATURE: 98.6
PATIENT TEMPERATURE: 38
PATIENT TEMPERATURE: 37
PCO2 ART: 64.5 mmHg — AB (ref 35.0–45.0)
PCO2 ART: 53.9 mmHg — AB (ref 35.0–45.0)
PCO2 ART: 49.2 mmHg — AB (ref 35.0–45.0)
PCO2 ART: 36.1 mmHg (ref 35.0–45.0)
PEEP/CPAP: 14 cmH2O
PEEP: 10 cmH2O
PEEP: 10 cmH2O
PEEP: 10 cmH2O
PH ART: 7.171 — AB (ref 7.350–7.450)
PH ART: 7.225 — AB (ref 7.350–7.450)
PH ART: 7.388 (ref 7.350–7.450)
PO2 ART: 77.2 mmHg — AB (ref 80.0–100.0)
Patient temperature: 37
RATE: 28 resp/min
RATE: 30 resp/min
TCO2: 20.7 mmol/L (ref 0–100)
TCO2: 20.8 mmol/L (ref 0–100)
TCO2: 21 mmol/L (ref 0–100)
TCO2: 20.1 mmol/L (ref 0–100)
VT: 490 mL
VT: 490 mL
VT: 490 mL
pH, Arterial: 7.278 — ABNORMAL LOW (ref 7.350–7.450)
pO2, Arterial: 82.8 mmHg (ref 80.0–100.0)
pO2, Arterial: 71.6 mmHg — ABNORMAL LOW (ref 80.0–100.0)
pO2, Arterial: 138 mmHg — ABNORMAL HIGH (ref 80.0–100.0)

## 2015-05-13 LAB — GLUCOSE, CAPILLARY
GLUCOSE-CAPILLARY: 89 mg/dL (ref 65–99)
GLUCOSE-CAPILLARY: 91 mg/dL (ref 65–99)
Glucose-Capillary: 103 mg/dL — ABNORMAL HIGH (ref 65–99)
Glucose-Capillary: 110 mg/dL — ABNORMAL HIGH (ref 65–99)
Glucose-Capillary: 132 mg/dL — ABNORMAL HIGH (ref 65–99)
Glucose-Capillary: 139 mg/dL — ABNORMAL HIGH (ref 65–99)
Glucose-Capillary: 97 mg/dL (ref 65–99)

## 2015-05-13 LAB — MAGNESIUM: Magnesium: 1.8 mg/dL (ref 1.7–2.4)

## 2015-05-13 LAB — CULTURE, BLOOD (ROUTINE X 2): Culture: NO GROWTH

## 2015-05-13 LAB — TRIGLYCERIDES: Triglycerides: 290 mg/dL — ABNORMAL HIGH (ref <150)

## 2015-05-13 MED ORDER — PRISMASOL BGK 4/2.5 32-4-2.5 MEQ/L IV SOLN
INTRAVENOUS | Status: DC
Start: 2015-05-13 — End: 2015-05-14
  Administered 2015-05-13 – 2015-05-14 (×4): via INTRAVENOUS_CENTRAL
  Filled 2015-05-13 (×5): qty 5000

## 2015-05-13 MED ORDER — SODIUM CHLORIDE 0.9 % IV SOLN
500.0000 mg | Freq: Three times a day (TID) | INTRAVENOUS | Status: DC
  Administered 2015-05-13 – 2015-05-15 (×6): 500 mg via INTRAVENOUS
  Filled 2015-05-13 (×8): qty 0.5

## 2015-05-13 MED ORDER — LIDOCAINE HCL (CARDIAC) 20 MG/ML IV SOLN
INTRAVENOUS | Status: AC
  Filled 2015-05-13: qty 5

## 2015-05-13 MED ORDER — ROCURONIUM BROMIDE 50 MG/5ML IV SOLN
INTRAVENOUS | Status: AC
  Administered 2015-05-13: 6 mg
  Filled 2015-05-13: qty 2

## 2015-05-13 MED ORDER — VITAMINS A & D EX OINT
TOPICAL_OINTMENT | CUTANEOUS | Status: AC
  Administered 2015-05-13: 1
  Filled 2015-05-13: qty 5

## 2015-05-13 MED ORDER — SUCCINYLCHOLINE CHLORIDE 20 MG/ML IJ SOLN
INTRAMUSCULAR | Status: AC
  Filled 2015-05-13: qty 1

## 2015-05-13 MED ORDER — BISACODYL 10 MG RE SUPP
10.0000 mg | Freq: Every day | RECTAL | Status: DC | PRN
  Administered 2015-05-13 – 2015-05-17 (×2): 10 mg via RECTAL
  Filled 2015-05-13 (×3): qty 1

## 2015-05-13 MED ORDER — HEPARIN SODIUM (PORCINE) 1000 UNIT/ML DIALYSIS
1000.0000 [IU] | INTRAMUSCULAR | Status: DC | PRN
  Administered 2015-05-13: 2400 [IU] via INTRAVENOUS_CENTRAL
  Filled 2015-05-13 (×2): qty 6

## 2015-05-13 MED ORDER — ETOMIDATE 2 MG/ML IV SOLN
INTRAVENOUS | Status: AC
  Filled 2015-05-13: qty 20

## 2015-05-13 MED ORDER — SODIUM CHLORIDE 0.9 % IV SOLN
500.0000 mg | Freq: Two times a day (BID) | INTRAVENOUS | Status: DC
  Filled 2015-05-13: qty 0.5

## 2015-05-13 MED ORDER — FUROSEMIDE 10 MG/ML IJ SOLN
INTRAMUSCULAR | Status: AC
  Administered 2015-05-13: 120 mg
  Filled 2015-05-13: qty 4

## 2015-05-13 MED ORDER — PRISMASOL BGK 4/2.5 32-4-2.5 MEQ/L IV SOLN
INTRAVENOUS | Status: DC
  Administered 2015-05-13 – 2015-05-14 (×2): via INTRAVENOUS_CENTRAL
  Filled 2015-05-13 (×3): qty 5000

## 2015-05-13 MED ORDER — VANCOMYCIN HCL IN DEXTROSE 1-5 GM/200ML-% IV SOLN
1000.0000 mg | INTRAVENOUS | Status: DC
  Administered 2015-05-13 – 2015-05-15 (×3): 1000 mg via INTRAVENOUS
  Filled 2015-05-13 (×3): qty 200

## 2015-05-13 MED ORDER — PRISMASOL BGK 4/2.5 32-4-2.5 MEQ/L IV SOLN
INTRAVENOUS | Status: DC
  Administered 2015-05-13 – 2015-05-14 (×8): via INTRAVENOUS_CENTRAL
  Filled 2015-05-13 (×17): qty 5000

## 2015-05-13 MED ORDER — HEPARIN (PORCINE) 2000 UNITS/L FOR CRRT: INTRAVENOUS_CENTRAL | Status: DC | PRN

## 2015-05-13 MED ORDER — ALTEPLASE 2 MG IJ SOLR
2.0000 mg | Freq: Once | INTRAMUSCULAR | Status: DC | PRN
  Filled 2015-05-13: qty 2

## 2015-05-13 NOTE — Progress Notes (Signed)
When nurse walked in room, patient was drooling what seemed to be tube feeding. Substance what appeared to look and smell like tube feed was also being suctioned from subglottic tube. Patient's tube feeds stopped and position of OG tube was verified via auscultation. Patient hooked up to low wall suction. NP Brett Canales Minor notified.

## 2015-05-13 NOTE — Progress Notes (Signed)
eLink Physician-Brief Progress Note Patient Name: Eddie Williams DOB: Nov 18, 1970 MRN: [id number]   Date of Service  05/13/2015  HPI/Events of Note  Sat 100%  eICU Interventions  Drop FIO2, rpt ABG ok Will tolerate some hyperpnea Goal RASS-2     Intervention Category Major Interventions: Other:  ALVA,RAKESH V. 05/13/2015, 6:09 PM

## 2015-05-13 NOTE — Progress Notes (Signed)
Hemodialysis Insertion Procedure Note AHMOD GILLESPIE [id number] November 23, 1970  Procedure: Insertion of Hemodialysis Catheter Type: 3 port  Indications: Hemodialysis   Procedure Details Consent: Risks of procedure as well as the alternatives and risks of each were explained to the (patient/caregiver).  Consent for procedure obtained. Time Out: Verified patient identification, verified procedure, site/side was marked, verified correct patient position, special equipment/implants available, medications/allergies/relevent history reviewed, required imaging and test results available.  Performed  Maximum sterile technique was used including antiseptics, cap, gloves, gown, hand hygiene, mask and sheet. Skin prep: Chlorhexidine; local anesthetic administered A antimicrobial bonded/coated triple lumen catheter was placed in the right internal jugular vein using the Seldinger technique. Ultrasound guidance used.Yes.   Catheter placed to 16 cm. Blood aspirated via all 3 ports and then flushed x 3. Line sutured x 2 and dressing applied.  Evaluation Blood flow good Complications: No apparent complications Patient did tolerate procedure well. Chest X-ray ordered to verify placement.  CXR: pending.  Brett Canales Minor ACNP [facility] PCCM Pager [phone] till 3 pm If no answer page [phone] 05/13/2015, 11:45 AM

## 2015-05-13 NOTE — Progress Notes (Signed)
PULMONARY / CRITICAL CARE MEDICINE   Name: Eddie Williams MRN: [id number] DOB: 01/16/1971    ADMISSION DATE:  05/07/2015  REFERRING MD :  [facility] ED  CHIEF COMPLAINT:  PNA, Acute respiratory distress  INITIAL PRESENTATION: 44 y/o man, hx polysubstance abuse, seizures, COPD / asthma, presented with B infiltrates and altered MS 9/17. Possible toxic ingestion of benzos and narcs. Progressed to respiratory failure and required intubation and MV, then hypotension presumed septic shock.   STUDIES:  CXR 9/16 >> no infiltrates CXR 9/17 >> Bilateral infiltrates and consolidation, perihilar predominant but involving other fields as well CT Head 9/17 >> no acute intracranial abnormality. EEG 9/18 >> abnormal EEG with presence of generalized background slowing / encephalopathy.  NO seizures. Echo 9/18 >> LVEF 55-60%, Grade 2 Diastolic dysfunction, LAE, RV mod dilated, systolic function mildly reduced, RA mod dilated, PA pressure 53 mm Hg  SIGNIFICANT EVENTS: 9/17  Intubation & Admission 9/17  Worsening mental status & questionable seizure activity 9/19  Self extubated x2, significant agitation  9/20  ETT above cords, pt able to make sounds on NP exam.  Tube adjusted and FOB at bedside for placement, BAL.   9/21  Severe vent dyssynchrony, started on pressure control ventilation 9/22  Sedate on 400 mcg fent, 8 versed, 48 propofol  9/23 heavy sedation with dilaudid, diprivan, versed   SUBJECTIVE:   Making some urine with lasix. Most likely will need CVVH but will defer to renal.   VITAL SIGNS: Temp:  [99.7 F (37.6 C)-101.8 F (38.8 C)] 100 F (37.8 C) (09/23 0800) Pulse Rate:  [80-95] 81 (09/23 0800) Resp:  [0-35] 28 (09/23 0800) BP: (90-128)/(47-67) 105/50 mmHg (09/23 0800) SpO2:  [94 %-100 %] 100 % (09/23 0800) FiO2 (%):  [60 %-80 %] 60 % (09/23 0750) Weight:  [210 lb 5.1 oz (95.4 kg)] 210 lb 5.1 oz (95.4 kg) (09/23 0400)   HEMODYNAMICS: CVP:  [16 mmHg] 16 mmHg   VENTILATOR  SETTINGS: Vent Mode:  [-] PRVC FiO2 (%):  [60 %-80 %] 60 % Set Rate:  [28 bmp-35 bmp] 35 bmp Vt Set:  [490 mL] 490 mL PEEP:  [10 cmH20-14 cmH20] 10 cmH20 Plateau Pressure:  [21 cmH20-31 cmH20] 28 cmH20   INTAKE / OUTPUT:  Intake/Output Summary (Last 24 hours) at 05/13/15 1006 Last data filed at 05/13/15 1000  Gross per 24 hour  Intake 3960.23 ml  Output    976 ml  Net 2984.23 ml    PHYSICAL EXAMINATION: Gen: young adult male in NAD, heavily sedated on vent HENT: NCAT, ETT in place PULM:  Even/non-labored on MV, lungs bilaterally with coarse rhonchi, basilar crackles  CV: RRR, no mgr GI : BS decreased, soft, nontender MSK: no acute deformities,lower ext feet with ischemic changes Neuro: sedated on vent   LABS:  CBC  Recent Labs Lab 05/10/15 0430 05/11/15 0405 05/12/15 0405  WBC 15.6* 13.5* 14.8*  HGB 8.7* 8.3* 8.4*  HCT 26.3* 25.5* 25.8*  PLT 121* PLATELET CLUMPS NOTED ON SMEAR, UNABLE TO ESTIMATE 65*   Coag's No results for input(s): APTT, INR in the last 168 hours.   BMET  Recent Labs Lab 05/11/15 0415 05/12/15 0405 05/13/15 0350  NA 145 142  142 142  K 3.9 4.4  4.4 4.9  CL 110 108  108 105  CO2 BUN 20 40*  41* 64*  CREATININE 0.98 2.91*  2.91* 4.53*  GLUCOSE 137* 123*  122* 124*   Electrolytes  Recent  Labs Lab 05/11/15 0405 05/11/15 0415 05/12/15 0405 05/13/15 0350  CALCIUM 7.4* 7.5* 7.4*  7.4* 7.2*  MG 1.8  --  1.8 1.8  PHOS  --  1.4* 3.0 3.8   Sepsis Markers  Recent Labs Lab 05/07/15 1705 05/08/15 0230 05/08/15 1410 05/08/15 2000 05/09/15 0400  LATICACIDVEN 2.1* 3.4* 2.3* 2.4* 1.6  PROCALCITON 16.09 19.43  --   --  5.15   ABG  Recent Labs Lab 05/12/15 1238 05/12/15 1506 05/13/15 0500  PHART 7.171* 7.220* 7.225*  PCO2ART 64.5* 55.0* 53.9*  PO2ART 82.8 154* 71.6*   Liver Enzymes  Recent Labs Lab 05/07/15 1250 05/08/15 0230  05/11/15 0415 05/12/15 0405 05/13/15 0350  AST 72* 67*  --   --    --   --   ALT 18 22  --   --   --   --   ALKPHOS 57 53  --   --   --   --   BILITOT 0.5 0.5  --   --   --   --   ALBUMIN 3.6 3.0*  < > 2.1* 1.9* 1.6*  < > = values in this interval not displayed. Cardiac Enzymes  Recent Labs Lab 05/07/15 1705 05/07/15 1706 05/08/15 0200  TROPONINI <0.03 <0.03 <0.03   Glucose  Recent Labs Lab 05/12/15 0744 05/12/15 1228 05/12/15 1542 05/12/15 2334 05/13/15 0326 05/13/15 0751  GLUCAP 147* 112* 149* 110* 132* 139*    Imaging 9/22  CXR Images personally reviewed > ETT 5 cm above the carina, diffuse bilateral airspace disease 9/23 : No CxR  ASSESSMENT / PLAN:  PULMONARY OETT 9/17 >> 9/19, 9/19>> A: ARDS-  worse again, severe vent dyssynchrony.  Improved compliance 9/22. Acute Hypercarbic Respiratory Failure HCAP vs aspiration PNA Self Extubation x2 - 9/19, bronch for placement but was in R mainstem.  CXR reviewed/measured 9/22, adjusted to 26 at teeth P:   Assess for PETAL trial Continue PCV 20 Daily ABG Diuresis when on pressors per renal Adjust ETT to 26 cm 9/22 Intermittent CXR Duoneb q4hr prn wheezing See ID   CARDIOVASCULAR CVL L IJ CVC 9/17 >>  A:  Shock - Septic, worsening shock but no new source identified Pulmonary hypertension suggested on echo - most likely due to acute vasoconstriction in setting of ARDS VT - short run noted overnight 9/22 P:  Tele monitoring  Will need f/u echo as outpatient Levophed for MAP >65 Assess Qtc, reviewed & Ok for methadone  RENAL A:   Acute Renal Failure - due to rhabdomyolysis & shock  Hypokalemia Hypernatremia Metabolic/Lactic Acidosis - trending q6hr Hypomagnesemia - Replacing IV. P:   Trend BMP / UOP  Assess foley placement  Replace electrolytes as needed Renal following, nearing need for CVVH with rising creatine but did respond to lasix. Most likely will need CCVH. KVO fluids Free water, 200 ml Q8   GASTROINTESTINAL A:   Moderate Protein Calorie  Malnutrition P:   Tube feeding per nutrition  Protonix IV q24hr D/C Pepcid with thrombocytopenia  Laxative as no bm x 4 days  HEMATOLOGIC A:   Mild Anemia - No signs of active bleeding Thrombocytopenia - suspect in setting of sepsis but also must consider HIT with heparin  P:  Daily CBC Trend platelets daily w/ CBC D/C pepcid with thrombocytopenia SCDs Heparin Anon Raices q8hr, dc'd 9/23 DC heparin 9/23, No pas with lower ext ischemia  INFECTIOUS A:   HCAP vs aspiration PNA > TMax 103.5 9/21 Fever - noted 9/19, tmax 102  P:    BCx2 9/17 >>  UC 9/17 >> neg Resp 9/17 >> E-Coli >> sens to aztreonam/imipenem FOB BAL 9/20 >> few yeast  Aztreonam 9/17 >> 9/22 Vanco 9/17 >> 9/19  Vanco 9/22 >>  Merropenem 9/22 >>    ENDOCRINE: CBG (last 3)   Recent Labs  05/12/15 2334 05/13/15 0326 05/13/15 0751  GLUCAP 110* 132* 139*     A:   Hyperglycemia - in setting of acute critical illness P:   SSI  Accuchecks q4hr  NEUROLOGIC A:   Suicide Attempt w/ Overdose (benzo, narcotic, cocaine) Acute encephalopathy - Toxic Metabolic + Overdose. Head CT negative Agitated Delirium / Difficult to achieve sedation Sedation for MV > needs high doses of fentanyl and versed and propofol H/O Seizure disorder - EEG 9/19 without evidence of seizure EtOH abuse P:   Seizure precautions RASS goal: -2 Initiate methadone 5 mg BID Dilaudid gtt initiated 9/22 out of concern for tachyphylaxis  D/c Fentanyl gtt once dilaudid gtt started Versed gtt Propofol gtt > follow triglycerides, wean to off as tolerated, discussed with RN.  Hopeful to get off by 9/23 with methadone. Thiamine, folate Psychiatry evaluation post extubation   FAMILY  - Updates: Patient's girlfriend updated 9/23 at bedside  - Inter-disciplinary family meet or Palliative Care meeting due by: 05/14/15    Brett Canales Minor ACNP [facility] PCCM Pager [phone] till 3 pm If no answer page [phone] 05/13/2015, 10:07 AM

## 2015-05-13 NOTE — Progress Notes (Signed)
Tried to titrate Mr. Menefee off of Diprivan, went down to , he became very tachypnic breathing about 43 bpm and began to desat.  He is maxed out on his Versed and Diluated.  Called e-link and they said to increase Diprivan, so we titrated back up to . -Nelly Laurence, RN

## 2015-05-13 NOTE — Progress Notes (Addendum)
ANTIBIOTIC CONSULT NOTE   Pharmacy Consult for Vancomycin and Meropenem Indication: HCAP  Allergies  Allergen Reactions  . Penicillins Anaphylaxis  . Ibuprofen Nausea Only  . Nsaids Nausea And Vomiting  . Erythromycin Rash   Patient Measurements: Height:  (175.3 cm) Weight: 210 lb 5.1 oz (95.4 kg) IBW/kg (Calculated) : 70.7  Vital Signs: Temp: 100 F (37.8 C) (09/23 0800) Temp Source: Core (Comment) (09/23 0800) BP: 105/50 mmHg (09/23 0800) Pulse Rate: 81 (09/23 0800) Intake/Output from previous day: 09/22 0701 - 09/23 0700 In: 3871.2 [I.V.:1635; WG/NF:6213.0; IV Piggyback:782] Out: 751 [Urine:751] Intake/Output from this shift: Total I/O In: 278.2 [I.V.:143.2; NG/GT:135] Out: 225 [Urine:225]  Labs:  Recent Labs  05/11/15 0405 05/11/15 0415 05/12/15 0405 05/13/15 0350  WBC 13.5*  --  14.8*  --   HGB 8.3*  --  8.4*  --   PLT PLATELET CLUMPS NOTED ON SMEAR, UNABLE TO ESTIMATE  --  65*  --   CREATININE 1.01 0.98 2.91*  2.91* 4.53*   Estimated Creatinine Clearance: 24 mL/min (by C-G formula based on Cr of 4.53). No results for input(s): VANCOTROUGH, VANCOPEAK, VANCORANDOM, GENTTROUGH, GENTPEAK, GENTRANDOM, TOBRATROUGH, TOBRAPEAK, TOBRARND, AMIKACINPEAK, AMIKACINTROU, AMIKACIN in the last 72 hours.   Microbiology: Recent Results (from the past 720 hour(s))  Culture, respiratory (NON-Expectorated)     Status: None   Collection Time: 05/07/15 11:54 AM  Result Value Ref Range Status   Specimen Description SPUTUM  Final   Special Requests NONE  Final   Gram Stain   Final    FEW WBC PRESENT, PREDOMINANTLY PMN FEW SQUAMOUS EPITHELIAL CELLS PRESENT MODERATE GRAM NEGATIVE RODS FEW GRAM POSITIVE RODS FEW YEAST    Culture   Final    ABUNDANT ESCHERICHIA COLI Performed at [facility]    Report Status 05/11/2015 FINAL  Final   Organism ID, Bacteria ESCHERICHIA COLI  Final      Susceptibility   Escherichia coli - MIC*    AMPICILLIN >=32 RESISTANT  Resistant     AMPICILLIN/SULBACTAM 4 SENSITIVE Sensitive     CEFEPIME <=1 SENSITIVE Sensitive     CEFTAZIDIME <=1 SENSITIVE Sensitive     CEFTRIAXONE <=1 SENSITIVE Sensitive     CIPROFLOXACIN >=4 RESISTANT Resistant     GENTAMICIN <=1 SENSITIVE Sensitive     IMIPENEM <=0.25 SENSITIVE Sensitive     PIP/TAZO <=4 SENSITIVE Sensitive     TOBRAMYCIN <=1 SENSITIVE Sensitive     TRIMETH/SULFA <=20 SENSITIVE Sensitive     AZTREONAM <=1 SENSITIVE Sensitive     * ABUNDANT ESCHERICHIA COLI  Culture, sputum-assessment     Status: None   Collection Time: 05/07/15  2:17 PM  Result Value Ref Range Status   Specimen Description SPUTUM  Final   Special Requests Immunocompromised  Final   Sputum evaluation   Final    THIS SPECIMEN IS ACCEPTABLE. RESPIRATORY CULTURE REPORT TO FOLLOW.   Report Status 05/07/2015 FINAL  Final  Urine culture     Status: None   Collection Time: 05/07/15  3:11 PM  Result Value Ref Range Status   Specimen Description URINE, CLEAN CATCH  Final   Special Requests Immunocompromised  Final   Culture   Final    NO GROWTH 2 DAYS Performed at [facility]    Report Status 05/09/2015 FINAL  Final  Blood Culture (routine x 2)     Status: None   Collection Time: 05/07/15  5:00 PM  Result Value Ref Range Status   Specimen Description BLOOD CENTRAL  LINE  Final   Special Requests BOTTLES DRAWN AEROBIC AND ANAEROBIC 5 CC EACH  Final   Culture   Final    NO GROWTH 5 DAYS Performed at [facility]    Report Status 05/12/2015 FINAL  Final  Blood Culture (routine x 2)     Status: None (Preliminary result)   Collection Time: 05/07/15  5:50 PM  Result Value Ref Range Status   Specimen Description BLOOD RIGHT ARM  Final   Special Requests   Final    BOTTLES DRAWN AEROBIC AND ANAEROBIC IMMUNOCOMPROMISED   Culture   Final    NO GROWTH 4 DAYS Performed at [facility]    Report Status PENDING  Incomplete  MRSA PCR Screening     Status: None   Collection  Time: 05/07/15  6:46 PM  Result Value Ref Range Status   MRSA by PCR NEGATIVE NEGATIVE Final    Comment:        The GeneXpert MRSA Assay (FDA approved for NASAL specimens only), is one component of a comprehensive MRSA colonization surveillance program. It is not intended to diagnose MRSA infection nor to guide or monitor treatment for MRSA infections.   Culture, bal-quantitative     Status: None   Collection Time: 05/10/15 11:32 AM  Result Value Ref Range Status   Specimen Description BRONCHIAL ALVEOLAR LAVAGE  Final   Special Requests Normal  Final   Gram Stain   Final    NO WBC SEEN NO SQUAMOUS EPITHELIAL CELLS SEEN NO ORGANISMS SEEN Performed at [facility]   Final    10,000 COLONIES/ML Performed at [facility]    Culture   Final    YEAST CONSISTENT WITH CANDIDA SPECIES Performed at [facility]    Report Status 05/12/2015 FINAL  Final    Anti-infectives    Start     Dose/Rate Route Frequency Ordered Stop   05/13/15 1100  meropenem (MERREM) 500 mg in sodium chloride 0.9 % 50 mL IVPB     500 mg 100 mL/hr over 30 Minutes Intravenous Every 12 hours 05/13/15 1024     05/13/15 1000  vancomycin (VANCOCIN) 1,250 mg in sodium chloride 0.9 % 250 mL IVPB     1,250 mg 166.7 mL/hr over 90 Minutes Intravenous Every 24 hours 05/12/15 1102     05/12/15 1145  meropenem (MERREM) 1 g in sodium chloride 0.9 % 100 mL IVPB  Status:  Discontinued     1 g 200 mL/hr over 30 Minutes Intravenous Every 12 hours 05/12/15 1102 05/13/15 1023   05/12/15 1130  vancomycin (VANCOCIN) 1,500 mg in sodium chloride 0.9 % 500 mL IVPB     1,500 mg 250 mL/hr over 120 Minutes Intravenous  Once 05/12/15 1102 05/12/15 1333   05/10/15 1800  aztreonam (AZACTAM) 2 g in dextrose 5 % 50 mL IVPB  Status:  Discontinued     2 g 100 mL/hr over 30 Minutes Intravenous Every 8 hours 05/10/15 1300 05/12/15 0935   05/08/15 1530  vancomycin (VANCOCIN) 1,250 mg in sodium  chloride 0.9 % 250 mL IVPB     1,250 mg 166.7 mL/hr over 90 Minutes Intravenous  Once 05/08/15 1509 05/08/15 1646   05/08/15 0200  aztreonam (AZACTAM) 1 g in dextrose 5 % 50 mL IVPB  Status:  Discontinued     1 g 100 mL/hr over 30 Minutes Intravenous Every 8 hours 05/07/15 1504 05/10/15 1300   05/07/15 1400  aztreonam (AZACTAM) 2 g in dextrose 5 % 50 mL IVPB     2 g 100 mL/hr over 30 Minutes Intravenous  Once 05/07/15 1354 05/07/15 1529   05/07/15 1400  vancomycin (VANCOCIN) IVPB 1000 mg/200 mL premix     1,000 mg 200 mL/hr over 60 Minutes Intravenous  Once 05/07/15 1354 05/07/15 1529     Assessment: 44yo M w/ SOB and wheezing (possible ingestion BZDs) Recent admission and ED visits with similar sxs, but left AMA on 9/16. CXR on 9/16 was clear, CXR ion 9/17 shows HCAP + ALI vs pulm edema. Pharmacy dosed Vanc and Aztreonam for HCAP. First doses given in the ED. Broadened to Vanc/Mero as pt has seen no imp. on Azactam 1g or 2g dose, renal function worsened, WBCs trend up, fevers continue, inc pressor need, unchanged CXR. Weight was 76kg one month ago. SCr 5.33 on admission, now 2.91, CrCl ~36.  Today, 05/13/2015: Tmax 101.9 WBC: Elevated 14.8, trending up Renal: AKI, SCr increasing again, SCr 4.53, Cl ~ 21N; UOP ~ 742ml/24 hr, Furosemide IV CXR: persistent opacities; ARDS vs edema  9/17 >> Aztreonam >> 9/22 9/17 >> Vanc >> 9/19; restart 9/22 9/22 >> Meropenem >>  9/17 blood x2: NGTD 9/17 urine: NGF 9/17 sputum: abundant E-coli >> S: rocephin, imipenem, bactrim, aztreonam. R: cipro, ampicillin 9/20 BAL: 10k yeast  Dose changes/levels: - See earlier notes for vancomycin dosing - previously dosing by levels with earlier AKI 9/21: Aztreonam inc to 2g IV q8h based on imp. renal function, body wt, and new fevers/leukocytosis 9/23: Meropenem to  q12, Vancomycin to  q24 for worsening renal function  Goal of Therapy:  Vancomycin trough level 15-20 mcg/ml  Appropriate antibiotic  dosing for renal function; eradication of infection  Plan:  Reduce Meropenem to  q12  Reduce Vancomycin to  q24, begin 1800  Otho Bellows PharmD Pager [phone] 05/13/2015, 10:39 AM   Addendum 3:14 PM : post-CRRT orders  Increase Meropenem to  q8hr  Continue Vancomycin 1gm q24 hr  Otho Bellows PharmD Pager [phone] 05/13/2015, 3:14 PM

## 2015-05-13 NOTE — Progress Notes (Signed)
[facility] Progress Note   Subjective: started on CRRT around noon, BP's a little better already.   Filed Vitals:   05/13/15 0500 05/13/15 0600 05/13/15 0700 05/13/15 0800  BP: 121/57 113/52 111/55 105/50  Pulse: 82 81 82 81  Temp: 100.6 F (38.1 C) 100.8 F (38.2 C) 100.2 F (37.9 C) 100 F (37.8 C)  TempSrc:    Core (Comment)  Resp: 33 35 35 28  Height:      Weight:      SpO2: 94% 100% 100% 100%   Exam: On vent, sedated Sclera anicteric, throat w ETT +JVD Chest occ basilar rales bilat RRR soft M, no RG Abd soft ntnd slightly distended +BS no ascites GU foley with cloudy urine, small amounts Diffuse 2+ pitting edema of LE's /UE's  Neuro is sedated on the vent  UA 9/17 > 7-10 rbc, 0-2 wbc, prot 30 Urine sediment / dip (undersigned ) > 2+blood/ trace prot/ 1.030, sediment with diffuse granular casts, lots of debris, 3-5 wbc/rbc per hpf, no cellular casts CXR bilat opacities c/w edema vs pna  Assessment: 1. AKI/ ATN- making urine, started CRRT today. Pressors coming down and BP may be improving. Cont CRRT max UF as tolerated if SBP over 100 2. PNA / septic shock 3. Volume overload / pulm edema 4. Hx COPD 5. Hx seizures 6. Hx chronic back pain 7. Hx HTN, was taking acei/ thiazide/ atenolol at home  Plan - as above    Eddie Moselle MD  pager [phone]    cell [phone]  05/13/2015, 10:50 AM     Recent Labs Lab 05/11/15 0415 05/12/15 0405 05/13/15 0350  NA 145 142  142 142  K 3.9 4.4  4.4 4.9  CL 110 108  108 105  CO2 GLUCOSE 137* 123*  122* 124*  BUN 20 40*  41* 64*  CREATININE 0.98 2.91*  2.91* 4.53*  CALCIUM 7.5* 7.4*  7.4* 7.2*  PHOS 1.4* 3.0 3.8    Recent Labs Lab 05/07/15 1250 05/08/15 0230  05/11/15 0415 05/12/15 0405 05/13/15 0350  AST 72* 67*  --   --   --   --   ALT 18 22  --   --   --   --   ALKPHOS 57 53  --   --   --   --   BILITOT 0.5 0.5  --   --   --   --   PROT 6.9 5.8*  --   --   --    --   ALBUMIN 3.6 3.0*  < > 2.1* 1.9* 1.6*  < > = values in this interval not displayed.  Recent Labs Lab 05/10/15 0430 05/11/15 0405 05/12/15 0405  WBC 15.6* 13.5* 14.8*  NEUTROABS 13.2* 10.6* 11.6*  HGB 8.7* 8.3* 8.4*  HCT 26.3* 25.5* 25.8*  MCV 98.9 100.4* 102.0*  PLT 121* PLATELET CLUMPS NOTED ON SMEAR, UNABLE TO ESTIMATE 65*   . antiseptic oral rinse  7 mL Mouth Rinse QID  . antiseptic oral rinse  7 mL Mouth Rinse QID  . chlorhexidine gluconate  15 mL Mouth Rinse BID  . feeding supplement (PRO-STAT SUGAR FREE 64)  30 mL Per Tube BID  . folic acid  1 mg Intravenous Daily  . free water  200 mL Per Tube 3 times per day  . furosemide  120 mg Intravenous Q8H  . heparin  5,000 Units Subcutaneous 3 times per day  . insulin  aspart  2-6 Units Subcutaneous 6 times per day  . meropenem (MERREM) IV  500 mg Intravenous Q12H  . methadone  5 mg Oral Q12H  . pantoprazole (PROTONIX) IV  40 mg Intravenous Q24H  . thiamine IV  100 mg Intravenous Daily  . vancomycin  1,000 mg Intravenous Q24H   . sodium chloride 10 mL/hr at 05/12/15 0600  . feeding supplement (VITAL 1.5 CAL) 1,000 mL (05/12/15 1716)  . HYDROmorphone 3 mg/hr (05/13/15 1000)  . midazolam (VERSED) infusion 6 mg/hr (05/13/15 0800)  . norepinephrine (LEVOPHED) Adult infusion 20 mcg/min (05/13/15 0800)  . propofol (DIPRIVAN) infusion 40 mcg/kg/min (05/13/15 1000)   sodium chloride, Place/Maintain arterial line **AND** sodium chloride, acetaminophen, bisacodyl, ipratropium-albuterol, midazolam

## 2015-05-13 NOTE — Progress Notes (Signed)
Eddie Williams began to stack breaths and drop his O2 sats at around 17:15 after propofol was titrated back up to .  Respiratory responded and box was called.  They said as long as his respirations stayed below 40 to maintain meds and vent settings.  Otherwise paralytics will be required.  Versed and Dilauded still maxed out.  -Nelly Laurence, RN

## 2015-05-14 ENCOUNTER — Inpatient Hospital Stay (HOSPITAL_COMMUNITY): Payer: Medicare Other

## 2015-05-14 LAB — POCT I-STAT, CHEM 8
BUN: 33 mg/dL — AB (ref 6–20)
BUN: 38 mg/dL — AB (ref 6–20)
BUN: 33 mg/dL — ABNORMAL HIGH (ref 6–20)
BUN: 39 mg/dL — AB (ref 6–20)
BUN: 32 mg/dL — AB (ref 6–20)
BUN: 34 mg/dL — ABNORMAL HIGH (ref 6–20)
BUN: 30 mg/dL — AB (ref 6–20)
BUN: 34 mg/dL — AB (ref 6–20)
CALCIUM ION: 0.98 mmol/L — AB (ref 1.12–1.23)
CHLORIDE: 99 mmol/L — AB (ref 101–111)
CHLORIDE: 99 mmol/L — AB (ref 101–111)
CHLORIDE: 97 mmol/L — AB (ref 101–111)
CHLORIDE: 98 mmol/L — AB (ref 101–111)
CHLORIDE: 103 mmol/L (ref 101–111)
CHLORIDE: 95 mmol/L — AB (ref 101–111)
CHLORIDE: 95 mmol/L — AB (ref 101–111)
CREATININE: 1.8 mg/dL — AB (ref 0.61–1.24)
CREATININE: 1.7 mg/dL — AB (ref 0.61–1.24)
Calcium, Ion: 0.55 mmol/L — CL (ref 1.12–1.23)
Calcium, Ion: 0.95 mmol/L — ABNORMAL LOW (ref 1.12–1.23)
Calcium, Ion: 0.46 mmol/L — CL (ref 1.12–1.23)
Calcium, Ion: 0.56 mmol/L — CL (ref 1.12–1.23)
Calcium, Ion: 0.87 mmol/L — ABNORMAL LOW (ref 1.12–1.23)
Calcium, Ion: 0.51 mmol/L — CL (ref 1.12–1.23)
Calcium, Ion: 0.86 mmol/L — ABNORMAL LOW (ref 1.12–1.23)
Chloride: 103 mmol/L (ref 101–111)
Creatinine, Ser: 2.2 mg/dL — ABNORMAL HIGH (ref 0.61–1.24)
Creatinine, Ser: 1.9 mg/dL — ABNORMAL HIGH (ref 0.61–1.24)
Creatinine, Ser: 2.4 mg/dL — ABNORMAL HIGH (ref 0.61–1.24)
Creatinine, Ser: 1.9 mg/dL — ABNORMAL HIGH (ref 0.61–1.24)
Creatinine, Ser: 1.9 mg/dL — ABNORMAL HIGH (ref 0.61–1.24)
Creatinine, Ser: 2 mg/dL — ABNORMAL HIGH (ref 0.61–1.24)
GLUCOSE: 117 mg/dL — AB (ref 65–99)
GLUCOSE: 188 mg/dL — AB (ref 65–99)
Glucose, Bld: 144 mg/dL — ABNORMAL HIGH (ref 65–99)
Glucose, Bld: 155 mg/dL — ABNORMAL HIGH (ref 65–99)
Glucose, Bld: 134 mg/dL — ABNORMAL HIGH (ref 65–99)
Glucose, Bld: 182 mg/dL — ABNORMAL HIGH (ref 65–99)
Glucose, Bld: 118 mg/dL — ABNORMAL HIGH (ref 65–99)
Glucose, Bld: 182 mg/dL — ABNORMAL HIGH (ref 65–99)
HCT: 26 % — ABNORMAL LOW (ref 39.0–52.0)
HCT: 28 % — ABNORMAL LOW (ref 39.0–52.0)
HCT: 28 % — ABNORMAL LOW (ref 39.0–52.0)
HCT: 21 % — ABNORMAL LOW (ref 39.0–52.0)
HEMATOCRIT: 29 % — AB (ref 39.0–52.0)
HEMATOCRIT: 28 % — AB (ref 39.0–52.0)
HEMATOCRIT: 22 % — AB (ref 39.0–52.0)
HEMATOCRIT: 29 % — AB (ref 39.0–52.0)
HEMOGLOBIN: 9.5 g/dL — AB (ref 13.0–17.0)
HEMOGLOBIN: 7.5 g/dL — AB (ref 13.0–17.0)
Hemoglobin: 8.8 g/dL — ABNORMAL LOW (ref 13.0–17.0)
Hemoglobin: 9.5 g/dL — ABNORMAL LOW (ref 13.0–17.0)
Hemoglobin: 9.9 g/dL — ABNORMAL LOW (ref 13.0–17.0)
Hemoglobin: 7.1 g/dL — ABNORMAL LOW (ref 13.0–17.0)
Hemoglobin: 9.5 g/dL — ABNORMAL LOW (ref 13.0–17.0)
Hemoglobin: 9.9 g/dL — ABNORMAL LOW (ref 13.0–17.0)
POTASSIUM: 4 mmol/L (ref 3.5–5.1)
POTASSIUM: 4.1 mmol/L (ref 3.5–5.1)
POTASSIUM: 3.9 mmol/L (ref 3.5–5.1)
POTASSIUM: 3.2 mmol/L — AB (ref 3.5–5.1)
POTASSIUM: 3.8 mmol/L (ref 3.5–5.1)
POTASSIUM: 3.1 mmol/L — AB (ref 3.5–5.1)
Potassium: 3.9 mmol/L (ref 3.5–5.1)
Potassium: 3.9 mmol/L (ref 3.5–5.1)
SODIUM: 137 mmol/L (ref 135–145)
SODIUM: 139 mmol/L (ref 135–145)
SODIUM: 139 mmol/L (ref 135–145)
SODIUM: 141 mmol/L (ref 135–145)
Sodium: 139 mmol/L (ref 135–145)
Sodium: 139 mmol/L (ref 135–145)
Sodium: 139 mmol/L (ref 135–145)
Sodium: 142 mmol/L (ref 135–145)
TCO2: 25 mmol/L (ref 0–100)
TCO2: 25 mmol/L (ref 0–100)
TCO2: 24 mmol/L (ref 0–100)
TCO2: 26 mmol/L (ref 0–100)
TCO2: 23 mmol/L (ref 0–100)
TCO2: 25 mmol/L (ref 0–100)
TCO2: 22 mmol/L (ref 0–100)
TCO2: 26 mmol/L (ref 0–100)

## 2015-05-14 LAB — CBC
HEMATOCRIT: 24.2 % — AB (ref 39.0–52.0)
Hemoglobin: 8.1 g/dL — ABNORMAL LOW (ref 13.0–17.0)
MCH: 32.8 pg (ref 26.0–34.0)
MCHC: 33.5 g/dL (ref 30.0–36.0)
MCV: 98 fL (ref 78.0–100.0)
Platelets: 43 10*3/uL — ABNORMAL LOW (ref 150–400)
RBC: 2.47 MIL/uL — AB (ref 4.22–5.81)
RDW: 16.6 % — ABNORMAL HIGH (ref 11.5–15.5)
WBC: 13.7 10*3/uL — AB (ref 4.0–10.5)

## 2015-05-14 LAB — RENAL FUNCTION PANEL
ALBUMIN: 1.7 g/dL — AB (ref 3.5–5.0)
ANION GAP: 8 (ref 5–15)
ANION GAP: 8 (ref 5–15)
Albumin: 1.7 g/dL — ABNORMAL LOW (ref 3.5–5.0)
BUN: 54 mg/dL — ABNORMAL HIGH (ref 6–20)
BUN: 45 mg/dL — ABNORMAL HIGH (ref 6–20)
CALCIUM: 7.3 mg/dL — AB (ref 8.9–10.3)
CALCIUM: 7.5 mg/dL — AB (ref 8.9–10.3)
CO2: 26 mmol/L (ref 22–32)
CO2: 27 mmol/L (ref 22–32)
CREATININE: 2.26 mg/dL — AB (ref 0.61–1.24)
Chloride: 104 mmol/L (ref 101–111)
Chloride: 104 mmol/L (ref 101–111)
Creatinine, Ser: 3.06 mg/dL — ABNORMAL HIGH (ref 0.61–1.24)
GFR calc Af Amer: 27 mL/min — ABNORMAL LOW (ref >60)
GFR calc Af Amer: 39 mL/min — ABNORMAL LOW (ref >60)
GFR calc non Af Amer: 23 mL/min — ABNORMAL LOW (ref >60)
GFR calc non Af Amer: 34 mL/min — ABNORMAL LOW (ref >60)
GLUCOSE: 102 mg/dL — AB (ref 65–99)
Glucose, Bld: 96 mg/dL (ref 65–99)
PHOSPHORUS: 4.3 mg/dL (ref 2.5–4.6)
Phosphorus: 3.6 mg/dL (ref 2.5–4.6)
Potassium: 4.6 mmol/L (ref 3.5–5.1)
Potassium: 4.3 mmol/L (ref 3.5–5.1)
SODIUM: 138 mmol/L (ref 135–145)
SODIUM: 139 mmol/L (ref 135–145)

## 2015-05-14 LAB — BLOOD GAS, ARTERIAL
ACID-BASE DEFICIT: 0.8 mmol/L (ref 0.0–2.0)
Bicarbonate: 24.4 mEq/L — ABNORMAL HIGH (ref 20.0–24.0)
DRAWN BY: 11249
FIO2: 0.5
MECHVT: 490 mL
O2 Saturation: 97.5 %
PEEP: 10 cmH2O
Patient temperature: 98.6
RATE: 30 resp/min
TCO2: 23.4 mmol/L (ref 0–100)
pCO2 arterial: 46.6 mmHg — ABNORMAL HIGH (ref 35.0–45.0)
pH, Arterial: 7.34 — ABNORMAL LOW (ref 7.350–7.450)
pO2, Arterial: 103 mmHg — ABNORMAL HIGH (ref 80.0–100.0)

## 2015-05-14 LAB — GLUCOSE, CAPILLARY
GLUCOSE-CAPILLARY: 87 mg/dL (ref 65–99)
GLUCOSE-CAPILLARY: 98 mg/dL (ref 65–99)
Glucose-Capillary: 114 mg/dL — ABNORMAL HIGH (ref 65–99)
Glucose-Capillary: 150 mg/dL — ABNORMAL HIGH (ref 65–99)
Glucose-Capillary: 80 mg/dL (ref 65–99)
Glucose-Capillary: 89 mg/dL (ref 65–99)

## 2015-05-14 LAB — TRIGLYCERIDES
TRIGLYCERIDES: 342 mg/dL — AB (ref <150)
Triglycerides: 268 mg/dL — ABNORMAL HIGH (ref <150)

## 2015-05-14 LAB — MAGNESIUM: Magnesium: 2.1 mg/dL (ref 1.7–2.4)

## 2015-05-14 LAB — APTT: aPTT: 33 seconds (ref 24–37)

## 2015-05-14 LAB — PROTIME-INR
INR: 1.39 (ref 0.00–1.49)
Prothrombin Time: 17.2 seconds — ABNORMAL HIGH (ref 11.6–15.2)

## 2015-05-14 MED ORDER — PRISMASOL B22GK 4/0 22-4 MEQ/L IV SOLN
INTRAVENOUS | Status: DC
  Administered 2015-05-14: 16:00:00 via INTRAVENOUS_CENTRAL
  Filled 2015-05-14 (×3): qty 5000

## 2015-05-14 MED ORDER — PRISMASOL B22GK 4/0 22-4 MEQ/L IV SOLN
INTRAVENOUS | Status: DC
  Administered 2015-05-14 – 2015-05-15 (×8): via INTRAVENOUS_CENTRAL
  Filled 2015-05-14 (×16): qty 5000

## 2015-05-14 MED ORDER — PRISMASOL B22GK 4/0 22-4 MEQ/L IV SOLN
INTRAVENOUS | Status: DC
  Filled 2015-05-14 (×3): qty 5000

## 2015-05-14 MED ORDER — DEXTROSE 5 % IV SOLN
20.0000 g | INTRAVENOUS | Status: DC
  Administered 2015-05-14 – 2015-05-15 (×2): 20 g via INTRAVENOUS_CENTRAL
  Filled 2015-05-14 (×3): qty 200

## 2015-05-14 MED ORDER — DEXTROSE 5 % IV SOLN
Status: DC
  Administered 2015-05-14 – 2015-05-15 (×2): via INTRAVENOUS_CENTRAL
  Filled 2015-05-14 (×3): qty 1500

## 2015-05-14 NOTE — Progress Notes (Signed)
[facility] Progress Note   Subjective: BP's better and off pressors now.  Net UF 3.8 negative yesterday. FiO2 down to 50%.   Filed Vitals:   05/14/15 0500 05/14/15 0600 05/14/15 0700 05/14/15 0800  BP: 96/39 104/55  112/64  Pulse: 78 74 73 85  Temp: 98.6 F (37 C) 98.2 F (36.8 C) 97.9 F (36.6 C) 98.2 F (36.8 C)  TempSrc:    Core (Comment)  Resp: 32  Height:      Weight:      SpO2: 100% 100% 100% 97%   Exam: On vent, sedated Sclera anicteric, throat w ETT +JVD Chest occ basilar rales bilat RRR soft M, no RG Abd soft ntnd slightly distended +BS no ascites GU foley with cloudy urine, small amounts Diffuse 2+ pitting edema of LE's /UE's  Neuro is sedated on the vent  UA 9/17 > 7-10 rbc, 0-2 wbc, prot 30 Urine sediment / dip (undersigned ) > 2+blood/ trace prot/ 1.030, sediment with diffuse granular casts, lots of debris, 3-5 wbc/rbc per hpf, no cellular casts CXR 9/24 still bilat infiltrates but less dense bilat  Assessment: 1. AKI/ ATN- D#2 CRRT 2. PNA / septic shock - improving, off pressors 3. Vol excess / pulm edema - improving some 4. Hx COPD 5. Hx seizures 6. Hx chronic back pain 7. Hx HTN, was taking acei/ thiazide/ atenolol at home  Plan - cont CRRT, max UF as BP tolerates    Vinson Moselle MD  pager [phone]    cell [phone]  05/14/2015, 9:07 AM     Recent Labs Lab 05/13/15 0350 05/13/15 1600 05/14/15 0430  NA 142 137 138  K 4.9 4.5 4.6  CL 105 103 104  CO2 GLUCOSE 124* 178* 96  BUN 64* 62* 54*  CREATININE 4.53* 3.89* 3.06*  CALCIUM 7.2* 7.4* 7.3*  PHOS 3.8 3.9 4.3    Recent Labs Lab 05/07/15 1250 05/08/15 0230  05/13/15 0350 05/13/15 1600 05/14/15 0430  AST 72* 67*  --   --   --   --   ALT 18 22  --   --   --   --   ALKPHOS 57 53  --   --   --   --   BILITOT 0.5 0.5  --   --   --   --   PROT 6.9 5.8*  --   --   --   --   ALBUMIN 3.6 3.0*  < > 1.6* 1.6* 1.7*  < > = values in this interval  not displayed.  Recent Labs Lab 05/10/15 0430 05/11/15 0405 05/12/15 0405 05/14/15 0430  WBC 15.6* 13.5* 14.8* 13.7*  NEUTROABS 13.2* 10.6* 11.6*  --   HGB 8.7* 8.3* 8.4* 8.1*  HCT 26.3* 25.5* 25.8* 24.2*  MCV 98.9 100.4* 102.0* 98.0  PLT 121* PLATELET CLUMPS NOTED ON SMEAR, UNABLE TO ESTIMATE 65* 43*   . antiseptic oral rinse  7 mL Mouth Rinse QID  . chlorhexidine gluconate  15 mL Mouth Rinse BID  . feeding supplement (PRO-STAT SUGAR FREE 64)  30 mL Per Tube BID  . folic acid  1 mg Intravenous Daily  . free water  200 mL Per Tube 3 times per day  . furosemide  120 mg Intravenous Q8H  . meropenem (MERREM) IV  500 mg Intravenous 3 times per day  . methadone  5 mg Oral Q12H  . pantoprazole (PROTONIX) IV  40 mg Intravenous Q24H  .  thiamine IV  100 mg Intravenous Daily  . vancomycin  1,000 mg Intravenous Q24H   . sodium chloride 10 mL/hr at 05/13/15 1300  . feeding supplement (VITAL 1.5 CAL) Stopped (05/13/15 1057)  . HYDROmorphone 3 mg/hr (05/14/15 0551)  . midazolam (VERSED) infusion 7 mg/hr (05/14/15 0228)  . norepinephrine (LEVOPHED) Adult infusion Stopped (05/13/15 2327)  . dialysis replacement fluid (prismasate) 600 mL/hr at 05/14/15 0202  . dialysis replacement fluid (prismasate) 200 mL/hr at 05/13/15 1240  . dialysate (PRISMASATE) 2,000 mL/hr at 05/14/15 0809  . propofol (DIPRIVAN) infusion 30 mcg/kg/min (05/14/15 0845)   sodium chloride, Place/Maintain arterial line **AND** sodium chloride, acetaminophen, alteplase, bisacodyl, heparin, heparin, ipratropium-albuterol, midazolam

## 2015-05-14 NOTE — Progress Notes (Signed)
PULMONARY / CRITICAL CARE MEDICINE   Name: Eddie Williams MRN: [id number] DOB: [birth year]    ADMISSION DATE:  05/07/2015  REFERRING MD :  [facility] ED  CHIEF COMPLAINT:  PNA, Acute respiratory distress  INITIAL PRESENTATION: 44 y/o man, hx polysubstance abuse, seizures, COPD / asthma, presented with B infiltrates and altered MS 9/17. Possible toxic ingestion of benzos and narcs. Progressed to respiratory failure and required intubation and MV, then hypotension presumed septic shock.   STUDIES:  CXR 9/16 >> no infiltrates CXR 9/17 >> Bilateral infiltrates and consolidation, perihilar predominant but involving other fields as well CT Head 9/17 >> no acute intracranial abnormality. EEG 9/18 >> abnormal EEG with presence of generalized background slowing / encephalopathy.  NO seizures. Echo 9/18 >> LVEF 55-60%, Grade 2 Diastolic dysfunction, LAE, RV mod dilated, systolic function mildly reduced, RA mod dilated, PA pressure 53 mm Hg  SIGNIFICANT EVENTS: 9/17  Intubation & Admission 9/17  Worsening mental status & questionable seizure activity 9/19  Self extubated x2, significant agitation  9/20  ETT above cords, pt able to make sounds on NP exam.  Tube adjusted and FOB at bedside for placement, BAL.   9/21  Severe vent dyssynchrony, started on pressure control ventilation 9/22  Sedate on 400 mcg fent, 8 versed, 48 propofol  9/23 heavy sedation with dilaudid, diprivan, versed   SUBJECTIVE:   Making some urine with lasix. Most likely will need CVVH but will defer to renal.  VITAL SIGNS: Temp:  [95 F (35 C)-99.5 F (37.5 C)] 99 F (37.2 C) (09/24 0900) Pulse Rate:  [64-85] 82 (09/24 0900) Resp:  [18-50] 28 (09/24 0900) BP: (94-135)/(39-70) 119/58 mmHg (09/24 0900) SpO2:  [91 %-100 %] 100 % (09/24 0900) FiO2 (%):  [50 %-60 %] 50 % (09/24 0900) Weight:  [195 lb 1.7 oz (88.5 kg)] 195 lb 1.7 oz (88.5 kg) (09/24 0447)   HEMODYNAMICS:    VENTILATOR SETTINGS: Vent Mode:  [-] PRVC FiO2 (%):   [50 %-60 %] 50 % Set Rate:  [30 bmp-35 bmp] 30 bmp Vt Set:  [490 mL] 490 mL PEEP:  [10 cmH20] 10 cmH20 Plateau Pressure:  [22 cmH20-31 cmH20] 22 cmH20   INTAKE / OUTPUT:  Intake/Output Summary (Last 24 hours) at 05/14/15 0932 Last data filed at 05/14/15 4098  Gross per 24 hour  Intake 3306.15 ml  Output   7459 ml  Net -4152.85 ml    PHYSICAL EXAMINATION: Gen: young adult male in NAD, heavily sedated on vent HENT: NCAT, ETT in place PULM:  Even/non-labored on MV, lungs bilaterally with coarse rhonchi, basilar crackles  CV: RRR, no mgr GI : BS decreased, soft, nontender MSK: no acute deformities,lower ext feet with ischemic changes Neuro: sedated on vent   LABS:  CBC  Recent Labs Lab 05/11/15 0405 05/12/15 0405 05/14/15 0430  WBC 13.5* 14.8* 13.7*  HGB 8.3* 8.4* 8.1*  HCT 25.5* 25.8* 24.2*  PLT PLATELET CLUMPS NOTED ON SMEAR, UNABLE TO ESTIMATE 65* 43*   Coag's  Recent Labs Lab 05/14/15 0430  APTT 33  INR 1.39     BMET  Recent Labs Lab 05/13/15 0350 05/13/15 1600 05/14/15 0430  NA 142 137 138  K 4.9 4.5 4.6  CL 105 103 104  CO2 BUN 64* 62* 54*  CREATININE 4.53* 3.89* 3.06*  GLUCOSE 124* 178* 96   Electrolytes  Recent Labs Lab 05/12/15 0405 05/13/15 0350 05/13/15 1600 05/14/15 0430  CALCIUM 7.4*  7.4* 7.2* 7.4* 7.3*  MG 1.8 1.8  --  2.1  PHOS 3.0 3.8 3.9 4.3   Sepsis Markers  Recent Labs Lab 05/07/15 1705 05/08/15 0230 05/08/15 1410 05/08/15 2000 05/09/15 0400  LATICACIDVEN 2.1* 3.4* 2.3* 2.4* 1.6  PROCALCITON 16.09 19.43  --   --  5.15   ABG  Recent Labs Lab 05/13/15 1435 05/13/15 1730 05/14/15 0500  PHART 7.278* 7.388 7.340*  PCO2ART 49.2* 36.1 46.6*  PO2ART 138* 77.2* 103*   Liver Enzymes  Recent Labs Lab 05/07/15 1250 05/08/15 0230  05/13/15 0350 05/13/15 1600 05/14/15 0430  AST 72* 67*  --   --   --   --   ALT 18 22  --   --   --   --   ALKPHOS 57 53  --   --   --   --   BILITOT 0.5 0.5  --    --   --   --   ALBUMIN 3.6 3.0*  < > 1.6* 1.6* 1.7*  < > = values in this interval not displayed. Cardiac Enzymes  Recent Labs Lab 05/07/15 1705 05/07/15 1706 05/08/15 0200  TROPONINI <0.03 <0.03 <0.03   Glucose  Recent Labs Lab 05/13/15 1357 05/13/15 1555 05/13/15 2034 05/13/15 2316 05/14/15 0450 05/14/15 0756  GLUCAP 97 103* 89 91 80 89    Imaging 9/22  CXR Images personally reviewed > ETT 5 cm above the carina, diffuse bilateral airspace disease 9/23 : No CxR  ASSESSMENT / PLAN:  PULMONARY OETT 9/17 >> 9/19, 9/19>> A: ARDS-  worse again, severe vent dyssynchrony.  Improved compliance 9/22. Acute Hypercarbic Respiratory Failure HCAP vs aspiration PNA Self Extubation x2 - 9/19, bronch for placement but was in R mainstem.  CXR reviewed/measured 9/22, adjusted to 26 at teeth P:   Continue PRVC. ARDS net protocol Daily ABG Diuresis when on pressors per renal Intermittent CXR Duoneb q4hr prn wheezing See ID   CARDIOVASCULAR CVL L IJ CVC 9/17 >>  A:  Shock - Septic, worsening shock but no new source identified Pulmonary hypertension suggested on echo - most likely due to acute vasoconstriction in setting of ARDS VT - short run noted overnight 9/22 P:  Tele monitoring  Will need f/u echo as outpatient Weaned off pressors  RENAL A:   Acute Renal Failure - due to rhabdomyolysis & shock  Hypokalemia Hypernatremia Metabolic/Lactic Acidosis - trending q6hr Hypomagnesemia - Replacing IV. P:   Trend BMP / UOP  Assess foley placement  Replace electrolytes as needed Started on CVVH yesterday. Taking fluid off 200-300 cc.hr as tolerated by BP  GASTROINTESTINAL A:   Moderate Protein Calorie Malnutrition P:   Tube feeding per nutrition  Protonix IV q24hr D/C Pepcid with thrombocytopenia  Laxative as no bm x 4 days  HEMATOLOGIC A:   Mild Anemia - No signs of active bleeding Thrombocytopenia - suspect in setting of sepsis but also must consider HIT with  heparin  P:  Daily CBC Trend platelets daily w/ CBC D/C pepcid with thrombocytopenia SCDs Heparin Parkline q8hr, dc'd 9/23 due to thrombocytopenia  INFECTIOUS A:   HCAP vs aspiration PNA > TMax 103.5 9/21 Fever - noted 9/19, tmax 102 P:    BCx2 9/17 >>  UC 9/17 >> neg Resp 9/17 >> E-Coli >> sens to aztreonam/imipenem FOB BAL 9/20 >> few yeast  Aztreonam 9/17 >> 9/22 Vanco 9/17 >> 9/19  Vanco 9/22 >>  Merropenem 9/22 >>    ENDOCRINE: CBG (last 3)   Recent Labs  05/13/15 2316  05/14/15 0450 05/14/15 0756  GLUCAP 91 80 89    A:   Hyperglycemia - in setting of acute critical illness P:   SSI  Accuchecks q4hr  NEUROLOGIC A:   Suicide Attempt w/ Overdose (benzo, narcotic, cocaine) Acute encephalopathy - Toxic Metabolic + Overdose. Head CT negative Agitated Delirium / Difficult to achieve sedation Sedation for MV > needs high doses of fentanyl and versed and propofol H/O Seizure disorder - EEG 9/19 without evidence of seizure EtOH abuse P:   Seizure precautions RASS goal: -2 Started on methadone Versed  and dialudid gtt Wean propofol off as triglycerides are elevated. Thiamine, folate Psychiatry evaluation post extubation  FAMILY  - Updates: Mother updated 9/24 at bedside - Inter-disciplinary family meet or Palliative Care meeting due by: 05/14/15  Chilton Greathouse MD [facility] Pager [phone] If no answer or after 3pm call: [phone] 05/14/2015, 9:33 AM

## 2015-05-15 ENCOUNTER — Inpatient Hospital Stay (HOSPITAL_COMMUNITY): Payer: Medicare Other

## 2015-05-15 LAB — RENAL FUNCTION PANEL
ALBUMIN: 2 g/dL — AB (ref 3.5–5.0)
ANION GAP: 11 (ref 5–15)
ANION GAP: 10 (ref 5–15)
Albumin: 1.9 g/dL — ABNORMAL LOW (ref 3.5–5.0)
BUN: 39 mg/dL — ABNORMAL HIGH (ref 6–20)
BUN: 33 mg/dL — ABNORMAL HIGH (ref 6–20)
CALCIUM: 8.4 mg/dL — AB (ref 8.9–10.3)
CHLORIDE: 98 mmol/L — AB (ref 101–111)
CO2: 32 mmol/L (ref 22–32)
CO2: 33 mmol/L — AB (ref 22–32)
Calcium: 7.7 mg/dL — ABNORMAL LOW (ref 8.9–10.3)
Chloride: 98 mmol/L — ABNORMAL LOW (ref 101–111)
Creatinine, Ser: 1.84 mg/dL — ABNORMAL HIGH (ref 0.61–1.24)
Creatinine, Ser: 1.52 mg/dL — ABNORMAL HIGH (ref 0.61–1.24)
GFR calc Af Amer: >60 mL/min (ref >60)
GFR calc non Af Amer: 43 mL/min — ABNORMAL LOW (ref >60)
GFR calc non Af Amer: 55 mL/min — ABNORMAL LOW (ref >60)
GFR, EST AFRICAN AMERICAN: 50 mL/min — AB (ref >60)
GLUCOSE: 122 mg/dL — AB (ref 65–99)
Glucose, Bld: 133 mg/dL — ABNORMAL HIGH (ref 65–99)
PHOSPHORUS: 2.2 mg/dL — AB (ref 2.5–4.6)
Phosphorus: 2.7 mg/dL (ref 2.5–4.6)
Potassium: 4 mmol/L (ref 3.5–5.1)
Potassium: 3.7 mmol/L (ref 3.5–5.1)
SODIUM: 141 mmol/L (ref 135–145)
Sodium: 141 mmol/L (ref 135–145)

## 2015-05-15 LAB — BLOOD GAS, ARTERIAL
ACID-BASE EXCESS: 6.6 mmol/L — AB (ref 0.0–2.0)
BICARBONATE: 30.7 meq/L — AB (ref 20.0–24.0)
Drawn by: 365271
FIO2: 0.4
LHR: 30 {breaths}/min
O2 Saturation: 97 %
PEEP/CPAP: 8 cmH2O
Patient temperature: 99.4
TCO2: 28.8 mmol/L (ref 0–100)
VT: 490 mL
pCO2 arterial: 45 mmHg (ref 35.0–45.0)
pH, Arterial: 7.451 — ABNORMAL HIGH (ref 7.350–7.450)
pO2, Arterial: 95.4 mmHg (ref 80.0–100.0)

## 2015-05-15 LAB — POCT I-STAT, CHEM 8
BUN: 27 mg/dL — ABNORMAL HIGH (ref 6–20)
BUN: 28 mg/dL — ABNORMAL HIGH (ref 6–20)
BUN: 28 mg/dL — ABNORMAL HIGH (ref 6–20)
BUN: 29 mg/dL — ABNORMAL HIGH (ref 6–20)
BUN: 29 mg/dL — ABNORMAL HIGH (ref 6–20)
BUN: 30 mg/dL — AB (ref 6–20)
BUN: 30 mg/dL — ABNORMAL HIGH (ref 6–20)
BUN: 30 mg/dL — ABNORMAL HIGH (ref 6–20)
BUN: 31 mg/dL — ABNORMAL HIGH (ref 6–20)
BUN: 31 mg/dL — ABNORMAL HIGH (ref 6–20)
BUN: 32 mg/dL — AB (ref 6–20)
BUN: 33 mg/dL — ABNORMAL HIGH (ref 6–20)
BUN: 33 mg/dL — ABNORMAL HIGH (ref 6–20)
CALCIUM ION: 0.82 mmol/L — AB (ref 1.12–1.23)
CALCIUM ION: 0.5 mmol/L — AB (ref 1.12–1.23)
CALCIUM ION: 0.5 mmol/L — AB (ref 1.12–1.23)
CALCIUM ION: 1.03 mmol/L — AB (ref 1.12–1.23)
CALCIUM ION: 0.54 mmol/L — AB (ref 1.12–1.23)
CALCIUM ION: 0.95 mmol/L — AB (ref 1.12–1.23)
CHLORIDE: 100 mmol/L — AB (ref 101–111)
CHLORIDE: 100 mmol/L — AB (ref 101–111)
CHLORIDE: 94 mmol/L — AB (ref 101–111)
CHLORIDE: 94 mmol/L — AB (ref 101–111)
CHLORIDE: 95 mmol/L — AB (ref 101–111)
CHLORIDE: 96 mmol/L — AB (ref 101–111)
CREATININE: 1.6 mg/dL — AB (ref 0.61–1.24)
Calcium, Ion: 0.47 mmol/L — CL (ref 1.12–1.23)
Calcium, Ion: 0.49 mmol/L — CL (ref 1.12–1.23)
Calcium, Ion: 0.51 mmol/L — CL (ref 1.12–1.23)
Calcium, Ion: 0.8 mmol/L — ABNORMAL LOW (ref 1.12–1.23)
Calcium, Ion: 0.86 mmol/L — ABNORMAL LOW (ref 1.12–1.23)
Calcium, Ion: 0.88 mmol/L — ABNORMAL LOW (ref 1.12–1.23)
Calcium, Ion: 1.05 mmol/L — ABNORMAL LOW (ref 1.12–1.23)
Chloride: 101 mmol/L (ref 101–111)
Chloride: 90 mmol/L — ABNORMAL LOW (ref 101–111)
Chloride: 92 mmol/L — ABNORMAL LOW (ref 101–111)
Chloride: 93 mmol/L — ABNORMAL LOW (ref 101–111)
Chloride: 94 mmol/L — ABNORMAL LOW (ref 101–111)
Chloride: 96 mmol/L — ABNORMAL LOW (ref 101–111)
Chloride: 99 mmol/L — ABNORMAL LOW (ref 101–111)
Creatinine, Ser: 1.4 mg/dL — ABNORMAL HIGH (ref 0.61–1.24)
Creatinine, Ser: 1.5 mg/dL — ABNORMAL HIGH (ref 0.61–1.24)
Creatinine, Ser: 1.5 mg/dL — ABNORMAL HIGH (ref 0.61–1.24)
Creatinine, Ser: 1.5 mg/dL — ABNORMAL HIGH (ref 0.61–1.24)
Creatinine, Ser: 1.6 mg/dL — ABNORMAL HIGH (ref 0.61–1.24)
Creatinine, Ser: 1.6 mg/dL — ABNORMAL HIGH (ref 0.61–1.24)
Creatinine, Ser: 1.7 mg/dL — ABNORMAL HIGH (ref 0.61–1.24)
Creatinine, Ser: 1.7 mg/dL — ABNORMAL HIGH (ref 0.61–1.24)
Creatinine, Ser: 1.7 mg/dL — ABNORMAL HIGH (ref 0.61–1.24)
Creatinine, Ser: 1.8 mg/dL — ABNORMAL HIGH (ref 0.61–1.24)
Creatinine, Ser: 1.9 mg/dL — ABNORMAL HIGH (ref 0.61–1.24)
Creatinine, Ser: 2 mg/dL — ABNORMAL HIGH (ref 0.61–1.24)
GLUCOSE: 158 mg/dL — AB (ref 65–99)
Glucose, Bld: 112 mg/dL — ABNORMAL HIGH (ref 65–99)
Glucose, Bld: 112 mg/dL — ABNORMAL HIGH (ref 65–99)
Glucose, Bld: 118 mg/dL — ABNORMAL HIGH (ref 65–99)
Glucose, Bld: 118 mg/dL — ABNORMAL HIGH (ref 65–99)
Glucose, Bld: 136 mg/dL — ABNORMAL HIGH (ref 65–99)
Glucose, Bld: 144 mg/dL — ABNORMAL HIGH (ref 65–99)
Glucose, Bld: 159 mg/dL — ABNORMAL HIGH (ref 65–99)
Glucose, Bld: 160 mg/dL — ABNORMAL HIGH (ref 65–99)
Glucose, Bld: 161 mg/dL — ABNORMAL HIGH (ref 65–99)
Glucose, Bld: 165 mg/dL — ABNORMAL HIGH (ref 65–99)
Glucose, Bld: 174 mg/dL — ABNORMAL HIGH (ref 65–99)
Glucose, Bld: 186 mg/dL — ABNORMAL HIGH (ref 65–99)
HCT: 27 % — ABNORMAL LOW (ref 39.0–52.0)
HCT: 28 % — ABNORMAL LOW (ref 39.0–52.0)
HCT: 29 % — ABNORMAL LOW (ref 39.0–52.0)
HEMATOCRIT: 22 % — AB (ref 39.0–52.0)
HEMATOCRIT: 22 % — AB (ref 39.0–52.0)
HEMATOCRIT: 24 % — AB (ref 39.0–52.0)
HEMATOCRIT: 24 % — AB (ref 39.0–52.0)
HEMATOCRIT: 28 % — AB (ref 39.0–52.0)
HEMATOCRIT: 30 % — AB (ref 39.0–52.0)
HEMATOCRIT: 30 % — AB (ref 39.0–52.0)
HEMATOCRIT: 30 % — AB (ref 39.0–52.0)
HEMATOCRIT: 31 % — AB (ref 39.0–52.0)
HEMATOCRIT: 31 % — AB (ref 39.0–52.0)
HEMOGLOBIN: 10.2 g/dL — AB (ref 13.0–17.0)
HEMOGLOBIN: 10.5 g/dL — AB (ref 13.0–17.0)
HEMOGLOBIN: 10.5 g/dL — AB (ref 13.0–17.0)
HEMOGLOBIN: 7.5 g/dL — AB (ref 13.0–17.0)
HEMOGLOBIN: 7.5 g/dL — AB (ref 13.0–17.0)
HEMOGLOBIN: 8.2 g/dL — AB (ref 13.0–17.0)
HEMOGLOBIN: 9.5 g/dL — AB (ref 13.0–17.0)
HEMOGLOBIN: 9.9 g/dL — AB (ref 13.0–17.0)
Hemoglobin: 8.2 g/dL — ABNORMAL LOW (ref 13.0–17.0)
Hemoglobin: 10.2 g/dL — ABNORMAL LOW (ref 13.0–17.0)
Hemoglobin: 10.2 g/dL — ABNORMAL LOW (ref 13.0–17.0)
Hemoglobin: 9.2 g/dL — ABNORMAL LOW (ref 13.0–17.0)
Hemoglobin: 9.5 g/dL — ABNORMAL LOW (ref 13.0–17.0)
POTASSIUM: 3.1 mmol/L — AB (ref 3.5–5.1)
POTASSIUM: 3.2 mmol/L — AB (ref 3.5–5.1)
POTASSIUM: 3.4 mmol/L — AB (ref 3.5–5.1)
POTASSIUM: 3.4 mmol/L — AB (ref 3.5–5.1)
POTASSIUM: 3.5 mmol/L (ref 3.5–5.1)
POTASSIUM: 3.5 mmol/L (ref 3.5–5.1)
POTASSIUM: 3.6 mmol/L (ref 3.5–5.1)
POTASSIUM: 3.7 mmol/L (ref 3.5–5.1)
Potassium: 3.6 mmol/L (ref 3.5–5.1)
Potassium: 2.7 mmol/L — CL (ref 3.5–5.1)
Potassium: 3.4 mmol/L — ABNORMAL LOW (ref 3.5–5.1)
Potassium: 3.5 mmol/L (ref 3.5–5.1)
Potassium: 3.5 mmol/L (ref 3.5–5.1)
SODIUM: 139 mmol/L (ref 135–145)
SODIUM: 141 mmol/L (ref 135–145)
SODIUM: 139 mmol/L (ref 135–145)
SODIUM: 140 mmol/L (ref 135–145)
SODIUM: 139 mmol/L (ref 135–145)
SODIUM: 142 mmol/L (ref 135–145)
SODIUM: 137 mmol/L (ref 135–145)
SODIUM: 137 mmol/L (ref 135–145)
Sodium: 138 mmol/L (ref 135–145)
Sodium: 139 mmol/L (ref 135–145)
Sodium: 139 mmol/L (ref 135–145)
Sodium: 139 mmol/L (ref 135–145)
Sodium: 141 mmol/L (ref 135–145)
TCO2: 22 mmol/L (ref 0–100)
TCO2: 23 mmol/L (ref 0–100)
TCO2: 25 mmol/L (ref 0–100)
TCO2: 25 mmol/L (ref 0–100)
TCO2: 26 mmol/L (ref 0–100)
TCO2: 26 mmol/L (ref 0–100)
TCO2: 26 mmol/L (ref 0–100)
TCO2: 26 mmol/L (ref 0–100)
TCO2: 27 mmol/L (ref 0–100)
TCO2: 28 mmol/L (ref 0–100)
TCO2: 30 mmol/L (ref 0–100)
TCO2: 30 mmol/L (ref 0–100)
TCO2: 31 mmol/L (ref 0–100)

## 2015-05-15 LAB — CBC
HEMATOCRIT: 25 % — AB (ref 39.0–52.0)
HEMOGLOBIN: 8.6 g/dL — AB (ref 13.0–17.0)
MCH: 33.3 pg (ref 26.0–34.0)
MCHC: 34.4 g/dL (ref 30.0–36.0)
MCV: 96.9 fL (ref 78.0–100.0)
Platelets: 59 10*3/uL — ABNORMAL LOW (ref 150–400)
RBC: 2.58 MIL/uL — ABNORMAL LOW (ref 4.22–5.81)
RDW: 16.1 % — AB (ref 11.5–15.5)
WBC: 16 10*3/uL — AB (ref 4.0–10.5)

## 2015-05-15 LAB — BASIC METABOLIC PANEL
ANION GAP: 12 (ref 5–15)
BUN: 41 mg/dL — ABNORMAL HIGH (ref 6–20)
CALCIUM: 7.6 mg/dL — AB (ref 8.9–10.3)
CHLORIDE: 98 mmol/L — AB (ref 101–111)
CO2: 32 mmol/L (ref 22–32)
Creatinine, Ser: 1.92 mg/dL — ABNORMAL HIGH (ref 0.61–1.24)
GFR calc non Af Amer: 41 mL/min — ABNORMAL LOW (ref >60)
GFR, EST AFRICAN AMERICAN: 48 mL/min — AB (ref >60)
Glucose, Bld: 133 mg/dL — ABNORMAL HIGH (ref 65–99)
Potassium: 3.8 mmol/L (ref 3.5–5.1)
SODIUM: 142 mmol/L (ref 135–145)

## 2015-05-15 LAB — PROCALCITONIN: PROCALCITONIN: 0.86 ng/mL

## 2015-05-15 LAB — GLUCOSE, CAPILLARY
GLUCOSE-CAPILLARY: 127 mg/dL — AB (ref 65–99)
GLUCOSE-CAPILLARY: 115 mg/dL — AB (ref 65–99)
Glucose-Capillary: 107 mg/dL — ABNORMAL HIGH (ref 65–99)
Glucose-Capillary: 112 mg/dL — ABNORMAL HIGH (ref 65–99)

## 2015-05-15 LAB — PHOSPHORUS: PHOSPHORUS: 2.6 mg/dL (ref 2.5–4.6)

## 2015-05-15 LAB — TRIGLYCERIDES: TRIGLYCERIDES: 314 mg/dL — AB (ref <150)

## 2015-05-15 LAB — MAGNESIUM: MAGNESIUM: 2.1 mg/dL (ref 1.7–2.4)

## 2015-05-15 LAB — APTT: APTT: 33 s (ref 24–37)

## 2015-05-15 MED ORDER — VITAMINS A & D EX OINT
TOPICAL_OINTMENT | CUTANEOUS | Status: AC
  Administered 2015-05-15: 13:00:00
  Filled 2015-05-15: qty 5

## 2015-05-15 MED ORDER — ONDANSETRON HCL 4 MG/2ML IJ SOLN
4.0000 mg | Freq: Four times a day (QID) | INTRAMUSCULAR | Status: DC | PRN
Start: 2015-05-15 — End: 2015-05-24
  Administered 2015-05-15 – 2015-05-23 (×9): 4 mg via INTRAVENOUS
  Filled 2015-05-15 (×9): qty 2

## 2015-05-15 MED ORDER — FUROSEMIDE 10 MG/ML IJ SOLN: 80.0000 mg | Freq: Three times a day (TID) | INTRAMUSCULAR | Status: DC

## 2015-05-15 MED ORDER — SODIUM CHLORIDE 0.9 % IV BOLUS (SEPSIS)
1000.0000 mL | Freq: Once | INTRAVENOUS | Status: AC
  Administered 2015-05-15: 1000 mL via INTRAVENOUS

## 2015-05-15 MED ORDER — DEXMEDETOMIDINE HCL IN NACL 200 MCG/50ML IV SOLN
0.4000 ug/kg/h | INTRAVENOUS | Status: DC
  Administered 2015-05-15: 0.9 ug/kg/h via INTRAVENOUS
  Administered 2015-05-15: 0.6 ug/kg/h via INTRAVENOUS
  Administered 2015-05-15: 0.7 ug/kg/h via INTRAVENOUS
  Administered 2015-05-15: 0.9 ug/kg/h via INTRAVENOUS
  Administered 2015-05-15: 0.4 ug/kg/h via INTRAVENOUS
  Administered 2015-05-16 (×8): 1.2 ug/kg/h via INTRAVENOUS
  Administered 2015-05-17: 0.7 ug/kg/h via INTRAVENOUS
  Administered 2015-05-17: 1 ug/kg/h via INTRAVENOUS
  Administered 2015-05-17: 0.8 ug/kg/h via INTRAVENOUS
  Administered 2015-05-17: 1 ug/kg/h via INTRAVENOUS
  Administered 2015-05-17: 0.8 ug/kg/h via INTRAVENOUS
  Administered 2015-05-17 (×3): 1.2 ug/kg/h via INTRAVENOUS
  Administered 2015-05-18: 0.4 ug/kg/h via INTRAVENOUS
  Administered 2015-05-18 (×2): 1 ug/kg/h via INTRAVENOUS
  Administered 2015-05-18: 0.5 ug/kg/h via INTRAVENOUS
  Filled 2015-05-15 (×14): qty 50
  Filled 2015-05-15: qty 100
  Filled 2015-05-15 (×5): qty 50
  Filled 2015-05-15: qty 100
  Filled 2015-05-15 (×4): qty 50

## 2015-05-15 MED ORDER — FUROSEMIDE 10 MG/ML IJ SOLN
120.0000 mg | Freq: Three times a day (TID) | INTRAVENOUS | Status: DC
  Administered 2015-05-15: 120 mg via INTRAVENOUS
  Filled 2015-05-15 (×4): qty 12

## 2015-05-15 MED ORDER — SODIUM CHLORIDE 0.9 % IV SOLN
1.0000 g | Freq: Three times a day (TID) | INTRAVENOUS | Status: DC
  Administered 2015-05-15 – 2015-05-16 (×2): 1 g via INTRAVENOUS
  Filled 2015-05-15 (×3): qty 1

## 2015-05-15 MED ORDER — FUROSEMIDE 10 MG/ML IJ SOLN
80.0000 mg | Freq: Three times a day (TID) | INTRAMUSCULAR | Status: DC
  Administered 2015-05-16 – 2015-05-18 (×6): 80 mg via INTRAVENOUS
  Filled 2015-05-15 (×6): qty 8

## 2015-05-15 MED ORDER — POTASSIUM CHLORIDE 10 MEQ/50ML IV SOLN
INTRAVENOUS | Status: AC
Start: 2015-05-15 — End: 2015-05-15
  Filled 2015-05-15: qty 200

## 2015-05-15 MED ORDER — POTASSIUM CHLORIDE 10 MEQ/50ML IV SOLN
10.0000 meq | INTRAVENOUS | Status: AC
  Administered 2015-05-15 (×4): 10 meq via INTRAVENOUS

## 2015-05-15 NOTE — Progress Notes (Addendum)
eLink Physician-Brief Progress Note Patient Name: Eddie Williams DOB: [birth year] MRN: [id number]   Date of Service  05/15/2015  HPI/Events of Note  Patient agitated after being taken off the versed drip  eICU Interventions  Will restart     Intervention Category Intermediate Interventions: Other:  Praveen Mannam 05/15/2015, 8:42 PM

## 2015-05-15 NOTE — Progress Notes (Signed)
Protocol: Vanco/Merrem  Indication: PNA  Assessment: Pt is on Vanco 1gm Q24h  and Meropenem  Q8h. CRRT has been d/ced.  Plan: Will continue Vanco 1gm IV Q24h but will check Vanco random level at 6am tomorrow. Will change Meropenem to 1gm IV Q8h starting tonight.  Thanks, Dorethea Clan, PharmD 05/15/2015

## 2015-05-15 NOTE — Progress Notes (Signed)
eLink Physician-Brief Progress Note Patient Name: Eddie Williams DOB: 1970-12-31 MRN: [id number]   Date of Service  05/15/2015  HPI/Events of Note  Hypotension - BP = 81/35 (MAP = 44) post Versed IV infusion restarted.   eICU Interventions  Will order: 1. 0.9 NaCl 1 liter IV over 1 hour now.      Intervention Category Intermediate Interventions: Hypotension - evaluation and management  Sommer,Steven Eugene 05/15/2015, 11:17 PM

## 2015-05-15 NOTE — Progress Notes (Signed)
Changed vent mode to PSV 5/5 40% patient tolerated well.

## 2015-05-15 NOTE — Progress Notes (Signed)
eLink Physician-Brief Progress Note Patient Name: Eddie Williams DOB: [birth year] MRN: [id number]   Date of Service  05/15/2015  HPI/Events of Note  Nausea. QTc interval = 337 milliseconds.   eICU Interventions  Will order: 1. Zofran 4 mg IV Q 6 hours PRN N/V. 2. Monitor QTc interval Q 6 hours. Notify MD if QTc interval is greater than 500 milliseconds.      Intervention Category Minor Interventions: Routine modifications to care plan (e.g. PRN medications for pain, fever)  Eddie Williams 05/15/2015, 11:40 PM

## 2015-05-15 NOTE — Plan of Care (Signed)
Problem: Phase I Progression Outcomes Goal: Patient tolerating weaning plan Outcome: Progressing Pt. Successfully weaned for about 3 hours on 05/15/15.

## 2015-05-15 NOTE — Progress Notes (Signed)
PULMONARY / CRITICAL CARE MEDICINE   Name: Eddie Williams MRN: [id number] DOB: 25-Oct-1970    ADMISSION DATE:  05/07/2015  REFERRING MD :  [facility] ED  CHIEF COMPLAINT:  PNA, Acute respiratory distress  INITIAL PRESENTATION: 44 y/o man, hx polysubstance abuse, seizures, COPD / asthma, presented with B infiltrates and altered MS 9/17. Possible toxic ingestion of benzos and narcs. Progressed to respiratory failure and required intubation and MV, then hypotension presumed septic shock.   STUDIES:  CXR 9/16 >> no infiltrates CXR 9/17 >> Bilateral infiltrates and consolidation, perihilar predominant but involving other fields as well CT Head 9/17 >> no acute intracranial abnormality. EEG 9/18 >> abnormal EEG with presence of generalized background slowing / encephalopathy.  NO seizures. Echo 9/18 >> LVEF 55-60%, Grade 2 Diastolic dysfunction, LAE, RV mod dilated, systolic function mildly reduced, RA mod dilated, PA pressure 53 mm Hg  SIGNIFICANT EVENTS: 9/17  Intubation & Admission 9/17  Worsening mental status & questionable seizure activity 9/19  Self extubated x2, significant agitation  9/20  ETT above cords, pt able to make sounds on NP exam.  Tube adjusted and FOB at bedside for placement, BAL.   9/21  Severe vent dyssynchrony, started on pressure control ventilation 9/22  Sedate on 400 mcg fent, 8 versed, 48 propofol  9/23 heavy sedation with dilaudid, diprivan, versed   SUBJECTIVE:   Good urine output. CVVH to stop today.  VITAL SIGNS: Temp:  [97.9 F (36.6 C)-100 F (37.8 C)] 98.2 F (36.8 C) (09/25 1000) Pulse Rate:  [80-105] 102 (09/25 1000) Resp:  [20-33] 26 (09/25 1000) BP: (91-146)/(45-82) 146/78 mmHg (09/25 1000) SpO2:  [96 %-100 %] 100 % (09/25 1000) FiO2 (%):  [40 %] 40 % (09/25 1108) Weight:  [182 lb 12.2 oz (82.9 kg)] 182 lb 12.2 oz (82.9 kg) (09/25 0222)   HEMODYNAMICS:    VENTILATOR SETTINGS: Vent Mode:  [-] PSV FiO2 (%):  [40 %] 40 % Set Rate:  [20 bmp-30  bmp] 20 bmp Vt Set:  [490 mL] 490 mL PEEP:  [5 cmH20-8 cmH20] 5 cmH20 Pressure Support:  [5 cmH20] 5 cmH20 Plateau Pressure:  [21 cmH20-24 cmH20] 24 cmH20   INTAKE / OUTPUT:  Intake/Output Summary (Last 24 hours) at 05/15/15 1125 Last data filed at 05/15/15 1101  Gross per 24 hour  Intake 3201.1 ml  Output   9546 ml  Net -6344.9 ml    PHYSICAL EXAMINATION: Gen: young adult male in NAD, Sedated on vent HENT: PERRL. Moist mucus membranes PULM: Clear antr CV: ERR, No MRG GI : Soft, + BS Neuro: sedated on vent   LABS:  CBC  Recent Labs Lab 05/12/15 0405 05/14/15 0430  05/15/15 0330  05/15/15 0637 05/15/15 0814 05/15/15 0818  WBC 14.8* 13.7*  --  16.0*  --   --   --   --   HGB 8.4* 8.1*  < > 8.6*  < > 7.5* 10.2* 9.2*  HCT 25.8* 24.2*  < > 25.0*  < > 22.0* 30.0* 27.0*  PLT 65* 43*  --  59*  --   --   --   --   < > = values in this interval not displayed. Coag's  Recent Labs Lab 05/14/15 0430 05/15/15 0330  APTT 33 33  INR 1.39  --      BMET  Recent Labs Lab 05/14/15 0430 05/14/15 1555  05/15/15 0330  05/15/15 0637 05/15/15 0814 05/15/15 0818  NA 138 139  < > 141  142  < >  142 139 137  K 4.6 4.3  < > 4.0  3.8  < > 2.7* 3.5 3.4*  CL 104 104  < > 98*  98*  < > 101 94* 94*  CO2 26 27  --  32  32  --   --   --   --   BUN 54* 45*  < > 39*  41*  < > 27* 28* 33*  CREATININE 3.06* 2.26*  < > 1.84*  1.92*  < > 1.50* 1.40* 1.90*  GLUCOSE 96 102*  < > 133*  133*  < > 112* 174* 136*  < > = values in this interval not displayed. Electrolytes  Recent Labs Lab 05/13/15 0350  05/14/15 0430 05/14/15 1555 05/15/15 0330  CALCIUM 7.2*  < > 7.3* 7.5* 7.7*  7.6*  MG 1.8  --  2.1  --  2.1  PHOS 3.8  < > 4.3 3.6 2.7  2.6  < > = values in this interval not displayed. Sepsis Markers  Recent Labs Lab 05/08/15 1410 05/08/15 2000 05/09/15 0400  LATICACIDVEN 2.3* 2.4* 1.6  PROCALCITON  --   --  5.15   ABG  Recent Labs Lab 05/13/15 1730  05/14/15 0500 05/15/15 0338  PHART 7.388 7.340* 7.451*  PCO2ART 36.1 46.6* 45.0  PO2ART 77.2* 103* 95.4   Liver Enzymes  Recent Labs Lab 05/14/15 0430 05/14/15 1555 05/15/15 0330  ALBUMIN 1.7* 1.7* 1.9*   Cardiac Enzymes No results for input(s): TROPONINI, PROBNP in the last 168 hours. Glucose  Recent Labs Lab 05/14/15 1243 05/14/15 1558 05/14/15 1929 05/14/15 2352 05/15/15 0534 05/15/15 0817  GLUCAP 87 98 150* 114* 107* 127*    Imaging 9/25- Improving b/l opacities.  ASSESSMENT / PLAN:  PULMONARY OETT 9/17 >> 9/19, 9/19>> A: ARDS-  worse again, severe vent dyssynchrony.  Improved compliance 9/22. Acute Hypercarbic Respiratory Failure HCAP vs aspiration PNA Self Extubation x2 - 9/19, bronch for placement but was in R mainstem.  CXR reviewed/measured 9/22, adjusted to 26 at teeth P:   Continue  ARDS net protocol Improving resp status. He is doing well on SBT today. Can extubate when more awake. Start precedex. Wean off the dilaudid and versed drip.  CARDIOVASCULAR CVL L IJ CVC 9/17 >>  A:  Shock - Septic, worsening shock but no new source identified Pulmonary hypertension suggested on echo - most likely due to acute vasoconstriction in setting of ARDS VT - short run noted overnight 9/22 P:  Tele monitoring  Will need f/u echo as outpatient Weaned off pressors  RENAL A:   Acute Renal Failure - due to rhabdomyolysis & shock  Hypokalemia Hypernatremia Metabolic/Lactic Acidosis - trending q6hr Hypomagnesemia - Replacing IV. P:   Replace electrolytes as needed Good urine output. Hopeful for renal recovery Will be taken of CVVH today.   GASTROINTESTINAL A:   Moderate Protein Calorie Malnutrition P:   Tube feeding per nutrition  Protonix IV q24hr D/C Pepcid with thrombocytopenia  Laxative as no bm x 4 days  HEMATOLOGIC A:   Mild Anemia - No signs of active bleeding Thrombocytopenia - suspect in setting of sepsis but also must consider HIT  with heparin  P:  Daily CBC Trend platelets daily w/ CBC D/C pepcid with thrombocytopenia SCDs Heparin [place] q8hr, dc'd 9/23 due to thrombocytopenia  INFECTIOUS A:   HCAP vs aspiration PNA > TMax 103.5 9/21 Fever - noted 9/19, tmax 102 P:    BCx2 9/17 >>  UC 9/17 >> neg Resp 9/17 >>  E-Coli >> sens to aztreonam/imipenem FOB BAL 9/20 >> few yeast  Aztreonam 9/17 >> 9/22 Vanco 9/17 >> 9/19  Vanco 9/22 >>  Meropenem 9/22 >>    Recheck procalcitonin. Will D/C abx if this is normal as he has received 9 days of broad spectrum therapy already.   ENDOCRINE: CBG (last 3)   Recent Labs  05/14/15 2352 05/15/15 0534 05/15/15 0817  GLUCAP 114* 107* 127*    A:   Hyperglycemia - in setting of acute critical illness P:   SSI  Accuchecks q4hr  NEUROLOGIC A:   Suicide Attempt w/ Overdose (benzo, narcotic, cocaine) Acute encephalopathy - Toxic Metabolic + Overdose. Head CT negative Agitated Delirium / Difficult to achieve sedation Sedation for MV > needs high doses of fentanyl and versed and propofol H/O Seizure disorder - EEG 9/19 without evidence of seizure EtOH abuse P:   Seizure precautions RASS goal: -2 Started on methadone Start precedex. Wean off Versed and dialudid gtt Thiamine, folate Psychiatry evaluation post extubation  FAMILY  - Updates: Mother updated 9/25 at bedside - Inter-disciplinary family meet or Palliative Care meeting due by: 05/14/15  Chilton Greathouse MD [place] [facility] Pager [phone] If no answer or after 3pm call: [phone] 05/15/2015, 11:25 AM

## 2015-05-15 NOTE — Progress Notes (Signed)
[facility] Progress Note   Subjective: UOP better 2700 cc yest, , net neg 5.7 L last 24hours.    Filed Vitals:   05/15/15 0602 05/15/15 0700 05/15/15 0800 05/15/15 0900  BP: 131/70 107/63 146/82 123/78  Pulse: 99 88 102 100  Temp: 99 F (37.2 C) 98.2 F (36.8 C) 98.1 F (36.7 C) 98.1 F (36.7 C)  TempSrc:   Core (Comment)   Resp: Height:      Weight:      SpO2: 100% 100% 96% 97%   Exam: On vent, sedated Sclera anicteric, throat w ETT +JVD Chest occ basilar rales bilat RRR soft M, no RG Abd soft ntnd slightly distended +BS no ascites GU foley with cloudy urine, small amounts LE's 1+ edema of LE's /UE's  Neuro is sedated on the vent  UA 9/17 > 7-10 rbc, 0-2 wbc, prot 30 Urine sediment / dip (undersigned ) > 2+blood/ trace prot/ 1.030, sediment with diffuse granular casts, lots of debris, 3-5 wbc/rbc per hpf, no cellular casts CXR 9/24 still bilat infiltrates but less dense bilat  Assessment: 1. AKI/ ATN- D#3 CRRT. Recovering renal function presumably w 2300 cc UOP yest 2. PNA - better 3. Shock- resolved, off pressors 4. Vol excess / pulm edema - mostly gone 5. Hx COPD 6. Hx seizures 7. Polysubstance abuse 8. Hx HTN, was taking acei/ thiazide/ atenolol at home  Plan - will dc CRRT this afternoon, start IV lasix, f/u CXR am    Vinson Moselle MD  pager [phone]    cell [phone]  05/15/2015, 9:06 AM     Recent Labs Lab 05/14/15 0430 05/14/15 1555  05/15/15 0330  05/15/15 0637 05/15/15 0814 05/15/15 0818  NA 138 139  < > 141  142  < > 142 139 137  K 4.6 4.3  < > 4.0  3.8  < > 2.7* 3.5 3.4*  CL 104 104  < > 98*  98*  < > 101 94* 94*  CO2 26 27  --  32  32  --   --   --   --   GLUCOSE 96 102*  < > 133*  133*  < > 112* 174* 136*  BUN 54* 45*  < > 39*  41*  < > 27* 28* 33*  CREATININE 3.06* 2.26*  < > 1.84*  1.92*  < > 1.50* 1.40* 1.90*  CALCIUM 7.3* 7.5*  --  7.7*  7.6*  --   --   --   --   PHOS 4.3 3.6  --  2.7  2.6   --   --   --   --   < > = values in this interval not displayed.  Recent Labs Lab 05/14/15 0430 05/14/15 1555 05/15/15 0330  ALBUMIN 1.7* 1.7* 1.9*    Recent Labs Lab 05/10/15 0430 05/11/15 0405 05/12/15 0405 05/14/15 0430  05/15/15 0330  05/15/15 0637 05/15/15 0814 05/15/15 0818  WBC 15.6* 13.5* 14.8* 13.7*  --  16.0*  --   --   --   --   NEUTROABS 13.2* 10.6* 11.6*  --   --   --   --   --   --   --   HGB 8.7* 8.3* 8.4* 8.1*  < > 8.6*  < > 7.5* 10.2* 9.2*  HCT 26.3* 25.5* 25.8* 24.2*  < > 25.0*  < > 22.0* 30.0* 27.0*  MCV 98.9 100.4* 102.0* 98.0  --  96.9  --   --   --   --  PLT 121* PLATELET CLUMPS NOTED ON SMEAR, UNABLE TO ESTIMATE 65* 43*  --  59*  --   --   --   --   < > = values in this interval not displayed. Marland Kitchen antiseptic oral rinse  7 mL Mouth Rinse QID  . chlorhexidine gluconate  15 mL Mouth Rinse BID  . feeding supplement (PRO-STAT SUGAR FREE 64)  30 mL Per Tube BID  . folic acid  1 mg Intravenous Daily  . free water  200 mL Per Tube 3 times per day  . furosemide  120 mg Intravenous Q8H  . meropenem (MERREM) IV  500 mg Intravenous 3 times per day  . methadone  5 mg Oral Q12H  . pantoprazole (PROTONIX) IV  40 mg Intravenous Q24H  . thiamine IV  100 mg Intravenous Daily  . vancomycin  1,000 mg Intravenous Q24H   . sodium chloride 10 mL/hr at 05/13/15 1300  . calcium gluconate infusion for CRRT 20 g (05/15/15 0820)  . feeding supplement (VITAL 1.5 CAL) Stopped (05/13/15 1057)  . HYDROmorphone 1.5 mg/hr (05/15/15 0742)  . midazolam (VERSED) infusion 5 mg/hr (05/15/15 0848)  . norepinephrine (LEVOPHED) Adult infusion Stopped (05/13/15 2327)  . dialysis replacement fluid (prismasate)    . dialysis replacement fluid (prismasate) 200 mL/hr at 05/14/15 1619  . dialysate (PRISMASATE) 2,000 mL/hr at 05/15/15 0819  . propofol (DIPRIVAN) infusion Stopped (05/14/15 1450)  . sodium citrate 2 %/dextrose 2.5% solution 3000 mL 310 mL/hr at 05/15/15 0823   sodium  chloride, Place/Maintain arterial line **AND** sodium chloride, acetaminophen, alteplase, bisacodyl, heparin, heparin, ipratropium-albuterol, midazolam

## 2015-05-15 NOTE — Progress Notes (Signed)
Placed patient back on full support due to increase in RR 38 BPM. Reduced RR to 20 BPM MD aware.

## 2015-05-16 ENCOUNTER — Inpatient Hospital Stay (HOSPITAL_COMMUNITY): Payer: Medicare Other

## 2015-05-16 LAB — BASIC METABOLIC PANEL
Anion gap: 10 (ref 5–15)
BUN: 53 mg/dL — AB (ref 6–20)
CALCIUM: 8.1 mg/dL — AB (ref 8.9–10.3)
CHLORIDE: 100 mmol/L — AB (ref 101–111)
CO2: 33 mmol/L — AB (ref 22–32)
CREATININE: 2.33 mg/dL — AB (ref 0.61–1.24)
GFR calc Af Amer: 38 mL/min — ABNORMAL LOW (ref >60)
GFR calc non Af Amer: 33 mL/min — ABNORMAL LOW (ref >60)
Glucose, Bld: 113 mg/dL — ABNORMAL HIGH (ref 65–99)
Potassium: 3.5 mmol/L (ref 3.5–5.1)
SODIUM: 143 mmol/L (ref 135–145)

## 2015-05-16 LAB — GLUCOSE, CAPILLARY
GLUCOSE-CAPILLARY: 103 mg/dL — AB (ref 65–99)
GLUCOSE-CAPILLARY: 113 mg/dL — AB (ref 65–99)
Glucose-Capillary: 106 mg/dL — ABNORMAL HIGH (ref 65–99)
Glucose-Capillary: 113 mg/dL — ABNORMAL HIGH (ref 65–99)
Glucose-Capillary: 110 mg/dL — ABNORMAL HIGH (ref 65–99)
Glucose-Capillary: 100 mg/dL — ABNORMAL HIGH (ref 65–99)

## 2015-05-16 LAB — BLOOD GAS, ARTERIAL
Acid-Base Excess: 5.9 mmol/L — ABNORMAL HIGH (ref 0.0–2.0)
Acid-base deficit: 6.2 mmol/L — ABNORMAL HIGH (ref 0.0–2.0)
BICARBONATE: 21.7 meq/L (ref 20.0–24.0)
Bicarbonate: 29.5 mEq/L — ABNORMAL HIGH (ref 20.0–24.0)
DRAWN BY: 307971
DRAWN BY: 308601
FIO2: 0.8
FIO2: 0.3
LHR: 20 {breaths}/min
MECHVT: 490 mL
MECHVT: 490 mL
O2 SAT: 98.8 %
O2 SAT: 96.5 %
PATIENT TEMPERATURE: 98.6
PATIENT TEMPERATURE: 37.7
PCO2 ART: 55 mmHg — AB (ref 35.0–45.0)
PEEP/CPAP: 5 cmH2O
PEEP: 5 cmH2O
PH ART: 7.22 — AB (ref 7.350–7.450)
PH ART: 7.472 — AB (ref 7.350–7.450)
PO2 ART: 154 mmHg — AB (ref 80.0–100.0)
PO2 ART: 86.5 mmHg (ref 80.0–100.0)
RATE: 35 resp/min
TCO2: 20.2 mmol/L (ref 0–100)
TCO2: 28 mmol/L (ref 0–100)
pCO2 arterial: 41.1 mmHg (ref 35.0–45.0)

## 2015-05-16 LAB — CBC
HCT: 21.6 % — ABNORMAL LOW (ref 39.0–52.0)
HEMOGLOBIN: 7.2 g/dL — AB (ref 13.0–17.0)
MCH: 32.7 pg (ref 26.0–34.0)
MCHC: 33.3 g/dL (ref 30.0–36.0)
MCV: 98.2 fL (ref 78.0–100.0)
Platelets: 84 10*3/uL — ABNORMAL LOW (ref 150–400)
RBC: 2.2 MIL/uL — ABNORMAL LOW (ref 4.22–5.81)
RDW: 15.8 % — AB (ref 11.5–15.5)
WBC: 19.2 10*3/uL — ABNORMAL HIGH (ref 4.0–10.5)

## 2015-05-16 LAB — VANCOMYCIN, TROUGH: VANCOMYCIN TR: 22 ug/mL — AB (ref 10.0–20.0)

## 2015-05-16 LAB — CALCIUM, IONIZED: Calcium, Ionized, Serum: 3.9 mg/dL — ABNORMAL LOW (ref 4.5–5.6)

## 2015-05-16 LAB — VANCOMYCIN, RANDOM: Vancomycin Rm: 31 ug/mL

## 2015-05-16 LAB — PROCALCITONIN: Procalcitonin: 0.7 ng/mL

## 2015-05-16 LAB — PHOSPHORUS: PHOSPHORUS: 3.7 mg/dL (ref 2.5–4.6)

## 2015-05-16 MED ORDER — VANCOMYCIN HCL IN DEXTROSE 1-5 GM/200ML-% IV SOLN
1000.0000 mg | Freq: Once | INTRAVENOUS | Status: AC
  Administered 2015-05-16: 1000 mg via INTRAVENOUS
  Filled 2015-05-16: qty 200

## 2015-05-16 MED ORDER — MEROPENEM 1 G IV SOLR
1.0000 g | Freq: Two times a day (BID) | INTRAVENOUS | Status: DC
  Administered 2015-05-16 – 2015-05-18 (×4): 1 g via INTRAVENOUS
  Filled 2015-05-16 (×5): qty 1

## 2015-05-16 NOTE — Progress Notes (Signed)
Wasted 11 mL of Hyrdomorphone 0.5mg /mL in sink with Jamison Oka., RN Milon Dikes, RN

## 2015-05-16 NOTE — Progress Notes (Signed)
Nutrition Follow-up  DOCUMENTATION CODES:   Non-severe (moderate) malnutrition in context of chronic illness  INTERVENTION:   When medically able, re-initiate Vital 1.5 @ 45 ml/hr and advance by 10 ml every 4 hours to goal rate of 55 ml/hr. 30 ml Prostat BID Tube feeding regimen provides 2180 kcal (100% of needs), 119 grams of protein (100% of needs), and 1008 ml of H2O.   RD to continue to monitor  NUTRITION DIAGNOSIS:   Inadequate oral intake related to inability to eat as evidenced by NPO status.  Ongoing.  GOAL:   Patient will meet greater than or equal to 90% of their needs  Not meeting.  MONITOR:   Vent status, TF tolerance, Weight trends, Labs, Skin, I & O's  ASSESSMENT:   44 yo man, hx polysubstance abuse, seizures, COPD / asthma, presented with B infiltrates and altered MS 9/17. Possible toxic ingestion of benzos and narcs. Progressed to respiratory failure and required intubation and MV, then hypotension presumed septic shock.   Pt off CRRT. Pt continues intubation, TF was stopped. Recommend restarting nutrition support as soon as able. Recommendations above.  Patient is currently intubated on ventilator support MV: 12.7 L/min Temp (24hrs), Avg:100.3 F (37.9 C), Min:98.6 F (37 C), Max:102.7 F (39.3 C)  Propofol: none  Labs reviewed: Elevated BUN & Creatinine Phos WNL  Diet Order:     Skin:  Wound (see comment) (R knee and R elbow wounds)  Last BM:  9/24  Height:   Ht Readings from Last 1 Encounters:  05/07/15  (1.753 m)    Weight:   Wt Readings from Last 1 Encounters:  05/16/15 175 lb 14.8 oz (79.8 kg)    Ideal Body Weight:  72.7 kg  BMI:  Body mass index is 25.97 kg/(m^2).  Estimated Nutritional Needs:   Kcal:  2132  Protein:  110-120g  Fluid:  2L/day  EDUCATION NEEDS:   No education needs identified at this time  Tilda Franco, MS, RD, LDN Pager: [phone] After Hours Pager: [phone]

## 2015-05-16 NOTE — Progress Notes (Signed)
ANTIBIOTIC CONSULT NOTE   Pharmacy Consult for Vancomycin and Meropenem Indication: HCAP  Allergies  Allergen Reactions  . Penicillins Anaphylaxis  . Ibuprofen Nausea Only  . Nsaids Nausea And Vomiting  . Erythromycin Rash   Patient Measurements: Height:  (175.3 cm) Weight: 175 lb 14.8 oz (79.8 kg) IBW/kg (Calculated) : 70.7  Vital Signs: Temp: 100 F (37.8 C) (09/26 0900) BP: 152/73 mmHg (09/26 0900) Pulse Rate: 100 (09/26 0900) Intake/Output from previous day: 09/25 0701 - 09/26 0700 In: 3640.9 [I.V.:1766.9; NG/GT:100; IV Piggyback:1774] Out: 5696 [Urine:1840; Emesis/NG output:750] Intake/Output from this shift: Total I/O In: 20 [I.V.:20] Out: -   Labs:  Recent Labs  05/14/15 0430  05/15/15 0330  05/15/15 1213 05/15/15 1217 05/15/15 1609 05/16/15 0530  WBC 13.7*  --  16.0*  --   --   --   --  19.2*  HGB 8.1*  < > 8.6*  < > 10.5* 9.9*  --  7.2*  PLT 43*  --  59*  --   --   --   --  84*  CREATININE 3.06*  < > 1.84*  1.92*  < > 1.60* 1.70* 1.52* 2.33*  < > = values in this interval not displayed. Estimated Creatinine Clearance: 40.9 mL/min (by C-G formula based on Cr of 2.33).  Recent Labs  05/16/15 0530  VANCORANDOM 31     Microbiology: Recent Results (from the past 720 hour(s))  Culture, respiratory (NON-Expectorated)     Status: None   Collection Time: 05/07/15 11:54 AM  Result Value Ref Range Status   Specimen Description SPUTUM  Final   Special Requests NONE  Final   Gram Stain   Final    FEW WBC PRESENT, PREDOMINANTLY PMN FEW SQUAMOUS EPITHELIAL CELLS PRESENT MODERATE GRAM NEGATIVE RODS FEW GRAM POSITIVE RODS FEW YEAST    Culture   Final    ABUNDANT ESCHERICHIA COLI Performed at [facility]    Report Status 05/11/2015 FINAL  Final   Organism ID, Bacteria ESCHERICHIA COLI  Final      Susceptibility   Escherichia coli - MIC*    AMPICILLIN >=32 RESISTANT Resistant     AMPICILLIN/SULBACTAM 4 SENSITIVE Sensitive     CEFEPIME  <=1 SENSITIVE Sensitive     CEFTAZIDIME <=1 SENSITIVE Sensitive     CEFTRIAXONE <=1 SENSITIVE Sensitive     CIPROFLOXACIN >=4 RESISTANT Resistant     GENTAMICIN <=1 SENSITIVE Sensitive     IMIPENEM <=0.25 SENSITIVE Sensitive     PIP/TAZO <=4 SENSITIVE Sensitive     TOBRAMYCIN <=1 SENSITIVE Sensitive     TRIMETH/SULFA <=20 SENSITIVE Sensitive     AZTREONAM <=1 SENSITIVE Sensitive     * ABUNDANT ESCHERICHIA COLI  Culture, sputum-assessment     Status: None   Collection Time: 05/07/15  2:17 PM  Result Value Ref Range Status   Specimen Description SPUTUM  Final   Special Requests Immunocompromised  Final   Sputum evaluation   Final    THIS SPECIMEN IS ACCEPTABLE. RESPIRATORY CULTURE REPORT TO FOLLOW.   Report Status 05/07/2015 FINAL  Final  Urine culture     Status: None   Collection Time: 05/07/15  3:11 PM  Result Value Ref Range Status   Specimen Description URINE, CLEAN CATCH  Final   Special Requests Immunocompromised  Final   Culture   Final    NO GROWTH 2 DAYS Performed at [facility]    Report Status 05/09/2015 FINAL  Final  Blood Culture (routine x 2)  Status: None   Collection Time: 05/07/15  5:00 PM  Result Value Ref Range Status   Specimen Description BLOOD CENTRAL LINE  Final   Special Requests BOTTLES DRAWN AEROBIC AND ANAEROBIC 5 CC EACH  Final   Culture   Final    NO GROWTH 5 DAYS Performed at [facility]    Report Status 05/12/2015 FINAL  Final  Blood Culture (routine x 2)     Status: None   Collection Time: 05/07/15  5:50 PM  Result Value Ref Range Status   Specimen Description BLOOD RIGHT ARM  Final   Special Requests   Final    BOTTLES DRAWN AEROBIC AND ANAEROBIC IMMUNOCOMPROMISED   Culture   Final    NO GROWTH 5 DAYS Performed at [facility]    Report Status 05/13/2015 FINAL  Final  MRSA PCR Screening     Status: None   Collection Time: 05/07/15  6:46 PM  Result Value Ref Range Status   MRSA by PCR NEGATIVE NEGATIVE  Final    Comment:        The GeneXpert MRSA Assay (FDA approved for NASAL specimens only), is one component of a comprehensive MRSA colonization surveillance program. It is not intended to diagnose MRSA infection nor to guide or monitor treatment for MRSA infections.   Culture, bal-quantitative     Status: None   Collection Time: 05/10/15 11:32 AM  Result Value Ref Range Status   Specimen Description BRONCHIAL ALVEOLAR LAVAGE  Final   Special Requests Normal  Final   Gram Stain   Final    NO WBC SEEN NO SQUAMOUS EPITHELIAL CELLS SEEN NO ORGANISMS SEEN Performed at [facility]   Final    10,000 COLONIES/ML Performed at [facility]    Culture   Final    YEAST CONSISTENT WITH CANDIDA SPECIES Performed at [facility]    Report Status 05/12/2015 FINAL  Final    Anti-infectives    Start     Dose/Rate Route Frequency Ordered Stop   05/16/15 1800  meropenem (MERREM) 1 g in sodium chloride 0.9 % 100 mL IVPB     1 g 200 mL/hr over 30 Minutes Intravenous Every 12 hours 05/16/15 0742     05/15/15 2200  meropenem (MERREM) 1 g in sodium chloride 0.9 % 100 mL IVPB  Status:  Discontinued     1 g 200 mL/hr over 30 Minutes Intravenous 3 times per day 05/15/15 1817 05/16/15 0742   05/13/15 1800  vancomycin (VANCOCIN) IVPB 1000 mg/200 mL premix  Status:  Discontinued     1,000 mg 200 mL/hr over 60 Minutes Intravenous Every 24 hours 05/13/15 1038 05/16/15 0743   05/13/15 1400  meropenem (MERREM) 500 mg in sodium chloride 0.9 % 50 mL IVPB  Status:  Discontinued     500 mg 100 mL/hr over 30 Minutes Intravenous 3 times per day 05/13/15 1342 05/15/15 1816   05/13/15 1100  meropenem (MERREM) 500 mg in sodium chloride 0.9 % 50 mL IVPB  Status:  Discontinued     500 mg 100 mL/hr over 30 Minutes Intravenous Every 12 hours 05/13/15 1024 05/13/15 1342   05/13/15 1000  vancomycin (VANCOCIN) 1,250 mg in sodium chloride 0.9 % 250 mL IVPB  Status:   Discontinued     1,250 mg 166.7 mL/hr over 90 Minutes Intravenous Every 24 hours 05/12/15 1102 05/13/15 1037   05/12/15 1145  meropenem (MERREM) 1 g in sodium  chloride 0.9 % 100 mL IVPB  Status:  Discontinued     1 g 200 mL/hr over 30 Minutes Intravenous Every 12 hours 05/12/15 1102 05/13/15 1023   05/12/15 1130  vancomycin (VANCOCIN) 1,500 mg in sodium chloride 0.9 % 500 mL IVPB     1,500 mg 250 mL/hr over 120 Minutes Intravenous  Once 05/12/15 1102 05/12/15 1333   05/10/15 1800  aztreonam (AZACTAM) 2 g in dextrose 5 % 50 mL IVPB  Status:  Discontinued     2 g 100 mL/hr over 30 Minutes Intravenous Every 8 hours 05/10/15 1300 05/12/15 0935   05/08/15 1530  vancomycin (VANCOCIN) 1,250 mg in sodium chloride 0.9 % 250 mL IVPB     1,250 mg 166.7 mL/hr over 90 Minutes Intravenous  Once 05/08/15 1509 05/08/15 1646   05/08/15 0200  aztreonam (AZACTAM) 1 g in dextrose 5 % 50 mL IVPB  Status:  Discontinued     1 g 100 mL/hr over 30 Minutes Intravenous Every 8 hours 05/07/15 1504 05/10/15 1300   05/07/15 1400  aztreonam (AZACTAM) 2 g in dextrose 5 % 50 mL IVPB     2 g 100 mL/hr over 30 Minutes Intravenous  Once 05/07/15 1354 05/07/15 1529   05/07/15 1400  vancomycin (VANCOCIN) IVPB 1000 mg/200 mL premix     1,000 mg 200 mL/hr over 60 Minutes Intravenous  Once 05/07/15 1354 05/07/15 1529     Assessment: 44yo M w/ SOB and wheezing (possible ingestion BZDs) Recent admission and ED visits with similar sxs, but left AMA on 9/16. CXR on 9/16 was clear, CXR ion 9/17 shows HCAP + ALI vs pulm edema. Pharmacy dosed Vanc and Aztreonam for HCAP. First doses given in the ED. Broadened to Vanc/Mero as pt has seen no imp. on Azactam 1g or 2g dose, renal function worsened, WBCs trend up, fevers continue, inc pressor need, unchanged CXR.  Today, 05/16/2015: Tmax 102.7, HR 100, RR 33 WBC: Elevated 19.2, trending up Renal: AKI taken off CRRT on 9/25>> SCr 2.33, CrCl ~41 PCT level reduced >80% from peak, but  patient still febrile and increasing WBC  Anti-Infectives 9/17 >> Aztreonam >> 9/22 9/17 >> Vanc >> 9/19; restart 9/22 9/22 >> Meropenem >>  Cultures 9/17 blood x2: NGTD 9/17 urine: NGF 9/17 sputum: abundant E-coli >> S: rocephin, imipenem, bactrim, aztreonam. R: cipro, ampicillin 9/20 BAL: 10k yeast  Dose changes/levels: - See earlier notes for vancomycin dosing - previously dosing by levels with earlier AKI 9/21: Aztreonam inc to 2g IV q8h based on imp. renal function, body wt, and new fevers/leukocytosis 9/23: Meropenem to  q12, Vancomycin to  q24h for worsening renal function 9/25: CRRT d/c'd >> Meropenem to 1g q8h, Vanc 1g  q24h 9/26: VR = 31 (11h post 1g)  Goal of Therapy:  Vancomycin trough level 15-20 mcg/ml  Appropriate antibiotic dosing for renal function; eradication of infection  Plan: --Obtain Vancomycin Level at 1730, vancomycin active order on hold, pharmacy to dose based on level --Change to Meropenem 1g q12h, based on CrCl off CRRT   Kathlynn Grate, PharmD Candidate  05/16/2015, 10:29 AM

## 2015-05-16 NOTE — Progress Notes (Signed)
Pharmacy: Re- vancomycin  Patient's a 44 y.o M now off CRRT on vancomycin and meropenem for HAP.  Last vancomycin dose (1gm) was given at 1800 on 9/25 with CRRT off at ~1600 on 9/25. Random vancomycin now back as 22 (~24 hours after last dose).    Plan: - redose vancomycin 1gm IV at 2200 tonight - will f/u with renal function and re-recheck another random level tomorrow.  Will re-dose if level is <20.  Dorna Leitz, PharmD, BCPS 05/16/2015 6:27 PM

## 2015-05-16 NOTE — Progress Notes (Signed)
PULMONARY / CRITICAL CARE MEDICINE   Name: Eddie Williams MRN: [id number] DOB: Mar 04, 1971    ADMISSION DATE:  05/07/2015  REFERRING MD :  [facility] ED  CHIEF COMPLAINT:  PNA, Acute respiratory distress  INITIAL PRESENTATION: 44 y/o man, hx polysubstance abuse, seizures, COPD / asthma, presented with B infiltrates and altered MS 9/17. Possible toxic ingestion of benzos and narcs. Progressed to respiratory failure and required intubation and MV, then hypotension presumed septic shock.   STUDIES:  CXR 9/16 >> no infiltrates CXR 9/17 >> Bilateral infiltrates and consolidation, perihilar predominant but involving other fields as well CT Head 9/17 >> no acute intracranial abnormality. EEG 9/18 >> abnormal EEG with presence of generalized background slowing / encephalopathy.  NO seizures. Echo 9/18 >> LVEF 55-60%, Grade 2 Diastolic dysfunction, LAE, RV mod dilated, systolic function mildly reduced, RA mod dilated, PA pressure 53 mm Hg  SIGNIFICANT EVENTS: 9/17  Intubation & Admission 9/17  Worsening mental status & questionable seizure activity 9/19  Self extubated x2, significant agitation  9/20  ETT above cords, pt able to make sounds on NP exam.  Tube adjusted and FOB at bedside for placement, BAL.   9/21  Severe vent dyssynchrony, started on pressure control ventilation 9/22  Sedate on 400 mcg fent, 8 versed, 48 propofol  9/23 heavy sedation with dilaudid, diprivan, versed   SUBJECTIVE:   Good urine output. CVVH stopped over the weekend. Restarted on norepinephrine.  VITAL SIGNS: Temp:  [98.6 F (37 C)-102.7 F (39.3 C)] 100.8 F (38.2 C) (09/26 1100) Pulse Rate:  [79-131] 91 (09/26 1115) Resp:  [17-60] 27 (09/26 1115) BP: (64-157)/(26-94) 117/72 mmHg (09/26 1115) SpO2:  [91 %-100 %] 100 % (09/26 1115) FiO2 (%):  [30 %-40 %] 30 % (09/26 1115) Weight:  [175 lb 14.8 oz (79.8 kg)] 175 lb 14.8 oz (79.8 kg) (09/26 0500)   HEMODYNAMICS: CVP:  [8 mmHg-25 mmHg] 8 mmHg  VENTILATOR  SETTINGS: Vent Mode:  [-] PRVC FiO2 (%):  [30 %-40 %] 30 % Set Rate:  [20 bmp] 20 bmp Vt Set:  [490 mL] 490 mL PEEP:  [5 cmH20] 5 cmH20 Plateau Pressure:  [15 cmH20-24 cmH20] 22 cmH20   INTAKE / OUTPUT:  Intake/Output Summary (Last 24 hours) at 05/16/15 1134 Last data filed at 05/16/15 1001  Gross per 24 hour  Intake 2827.34 ml  Output   3654 ml  Net -826.66 ml    PHYSICAL EXAMINATION: Gen: young adult male in NAD, Sedated on vent HENT: PERRL. Moist mucus membranes PULM: Clear antr CV: ERR, No MRG GI : Soft, + BS Neuro: sedated on vent  LABS:  CBC  Recent Labs Lab 05/14/15 0430  05/15/15 0330  05/15/15 1213 05/15/15 1217 05/16/15 0530  WBC 13.7*  --  16.0*  --   --   --  19.2*  HGB 8.1*  < > 8.6*  < > 10.5* 9.9* 7.2*  HCT 24.2*  < > 25.0*  < > 31.0* 29.0* 21.6*  PLT 43*  --  59*  --   --   --  84*  < > = values in this interval not displayed. Coag's  Recent Labs Lab 05/14/15 0430 05/15/15 0330  APTT 33 33  INR 1.39  --      BMET  Recent Labs Lab 05/15/15 0330  05/15/15 1217 05/15/15 1609 05/16/15 0530  NA 141  142  < > 138 141 143  K 4.0  3.8  < > 3.5 3.7 3.5  CL 98*  98*  < > 92* 98* 100*  CO2 32  32  --   --  33* 33*  BUN 39*  41*  < > 31* 33* 53*  CREATININE 1.84*  1.92*  < > 1.70* 1.52* 2.33*  GLUCOSE 133*  133*  < > 158* 122* 113*  < > = values in this interval not displayed. Electrolytes  Recent Labs Lab 05/13/15 0350  05/14/15 0430  05/15/15 0330 05/15/15 1609 05/16/15 0530  CALCIUM 7.2*  < > 7.3*  < > 7.7*  7.6* 8.4* 8.1*  MG 1.8  --  2.1  --  2.1  --   --   PHOS 3.8  < > 4.3  < > 2.7  2.6 2.2* 3.7  < > = values in this interval not displayed. Sepsis Markers  Recent Labs Lab 05/15/15 0330 05/16/15 0530  PROCALCITON 0.86 0.70   ABG  Recent Labs Lab 05/14/15 0500 05/15/15 0338 05/16/15 0350  PHART 7.340* 7.451* 7.472*  PCO2ART 46.6* 45.0 41.1  PO2ART 103* 95.4 86.5   Liver Enzymes  Recent Labs Lab  05/14/15 1555 05/15/15 0330 05/15/15 1609  ALBUMIN 1.7* 1.9* 2.0*   Cardiac Enzymes No results for input(s): TROPONINI, PROBNP in the last 168 hours. Glucose  Recent Labs Lab 05/15/15 0817 05/15/15 1607 05/15/15 2016 05/16/15 0006 05/16/15 0356 05/16/15 0746  GLUCAP 127* 115* 112* 106* 113* 103*    Imaging 9/26- Persistant b/l opacities.  ASSESSMENT / PLAN:  PULMONARY OETT 9/17 >> 9/19, 9/19>> A: ARDS-  worse again, severe vent dyssynchrony.  Improved compliance 9/22. Acute Hypercarbic Respiratory Failure HCAP vs aspiration PNA Self Extubation x2 - 9/19, bronch for placement but was in R mainstem.  CXR reviewed/measured 9/22, adjusted to 26 at teeth P:   Continue  ARDS net protocol Continue precedex, dilaudid and versed drips.  Daily SBTs  CARDIOVASCULAR CVL L IJ CVC 9/17 >>  A:  Shock - Septic, worsening shock but no new source identified Pulmonary hypertension suggested on echo - most likely due to acute vasoconstriction in setting of ARDS VT - short run noted overnight 9/22 P:  Tele monitoring  Will need f/u echo as outpatient Back on norepi.  RENAL A:   Acute Renal Failure - due to rhabdomyolysis & shock  Hypokalemia Hypernatremia Metabolic/Lactic Acidosis - trending q6hr Hypomagnesemia - Replacing IV. P:   Replace electrolytes as needed Good urine output. Hopeful for renal recovery Monitor of CVVH.   GASTROINTESTINAL A:   Moderate Protein Calorie Malnutrition P:   Tube feeding per nutrition  Protonix IV q24hr D/C Pepcid with thrombocytopenia  Laxative as no bm x 4 days  HEMATOLOGIC A:   Mild Anemia - No signs of active bleeding Thrombocytopenia - suspect in setting of sepsis but also must consider HIT with heparin  P:  Daily CBC Trend platelets daily w/ CBC D/C pepcid with thrombocytopenia SCDs Heparin [place] q8hr, dc'd 9/23 due to thrombocytopenia  INFECTIOUS A:   HCAP vs aspiration PNA > TMax 103.5 9/21 Fever - noted 9/19, tmax  102 P:    BCx2 9/17 >>  UC 9/17 >> neg Resp 9/17 >> E-Coli >> sens to aztreonam/imipenem FOB BAL 9/20 >> few yeast  Aztreonam 9/17 >> 9/22 Vanco 9/17 >> 9/19  Vanco 9/22 >>  Meropenem 9/22 >>    ENDOCRINE: CBG (last 3)   Recent Labs  05/16/15 0006 05/16/15 0356 05/16/15 0746  GLUCAP 106* 113* 103*   A:   Hyperglycemia - in setting of acute critical illness  P:   SSI  Accuchecks q4hr  NEUROLOGIC A:   Suicide Attempt w/ Overdose (benzo, narcotic, cocaine) Acute encephalopathy - Toxic Metabolic + Overdose. Head CT negative Agitated Delirium / Difficult to achieve sedation Sedation for MV > needs high doses of fentanyl and versed and propofol H/O Seizure disorder - EEG 9/19 without evidence of seizure EtOH abuse P:   Seizure precautions RASS goal: -2 Started on methadone Start precedex. Try to wean off Versed and dialudid gtt Thiamine, folate Psychiatry evaluation post extubation  FAMILY  - Updates: Mother updated 9/26 at bedside - Inter-disciplinary family meet or Palliative Care meeting due by: 05/14/15  Chilton Greathouse MD [facility] Pager [phone] If no answer or after 3pm call: [phone] 05/16/2015, 11:34 AM

## 2015-05-16 NOTE — Progress Notes (Signed)
eLink Physician-Brief Progress Note Patient Name: Eddie Williams DOB: 11-17-1970 MRN: [id number]   Date of Service  05/16/2015  HPI/Events of Note  Hypotension again - BP = 76/32 (MAP = 45).  eICU Interventions  Will order: 1. Norepinephrine IV infusion.  2. Monitor CVP Q 4 hours.      Intervention Category Major Interventions: Hypotension - evaluation and management  Sommer,Steven Eugene 05/16/2015, 12:50 AM

## 2015-05-16 NOTE — Progress Notes (Signed)
S: Intubated O:BP 117/72 mmHg  Pulse 91  Temp(Src) 100.8 F (38.2 C) (Oral)  Resp 27  Ht  (1.753 m)  Wt 79.8 kg (175 lb 14.8 oz)  BMI 25.97 kg/m2  SpO2 100%  Intake/Output Summary (Last 24 hours) at 05/16/15 1138 Last data filed at 05/16/15 1001  Gross per 24 hour  Intake 2827.34 ml  Output   3654 ml  Net -826.66 ml   Weight change: -3.1 kg (-6 lb 13.4 oz) YNW:[id number].  Eyes open but somnolent CVS:RRR Resp: basilar crackles Abd: + BS NTND Ext: 0-tr edema NEURO: Sedated Rt IJ HD cath.  Lt IJ triple lumen   . antiseptic oral rinse  7 mL Mouth Rinse QID  . chlorhexidine gluconate  15 mL Mouth Rinse BID  . feeding supplement (PRO-STAT SUGAR FREE 64)  30 mL Per Tube BID  . folic acid  1 mg Intravenous Daily  . free water  200 mL Per Tube 3 times per day  . furosemide  80 mg Intravenous Q8H  . meropenem (MERREM) IV  1 g Intravenous Q12H  . methadone  5 mg Oral Q12H  . pantoprazole (PROTONIX) IV  40 mg Intravenous Q24H  . thiamine IV  100 mg Intravenous Daily   Dg Chest Port 1 View  05/16/2015   CLINICAL DATA:  Acute respiratory failure with septic shock, history of asthma -COPD, current tobacco use  EXAM: PORTABLE CHEST 1 VIEW  COMPARISON:  Portable chest x-ray of May 15, 2015  FINDINGS: The lungs are adequately inflated. The interstitial markings remain increased bilaterally. The cardiac silhouette is normal in size. The pulmonary vascularity is not engorged. The mediastinum is normal in width. There is no pleural effusion. The endotracheal tube tip lies 4.8 cm above the carina. The esophagogastric tube tip projects below the inferior margin of the image. The you right internal jugular dual-lumen catheter tip projects over the proximal SVC.  IMPRESSION: Persistent interstitial prominence consistent with interstitial edema or pneumonia. No alveolar infiltrates are demonstrated.   Electronically Signed   By: David  [place] M.D.   On: 05/16/2015 07:38   Dg Chest Port 1  View  05/15/2015   CLINICAL DATA:  Acute respiratory failure, COPD.  EXAM: PORTABLE CHEST 1 VIEW  COMPARISON:  05/14/2015  FINDINGS: Support devices are stable. Heart is normal size. Improving bilateral airspace opacities. Continued diffuse interstitial prominence. No effusions. No acute bony abnormality.  IMPRESSION: Improving bilateral airspace opacities with continued diffuse interstitial prominence.   Electronically Signed   By: Charlett Nose M.D.   On: 05/15/2015 07:27   BMET    Component Value Date/Time   NA 143 05/16/2015 0530   K 3.5 05/16/2015 0530   CL 100* 05/16/2015 0530   CO2 33* 05/16/2015 0530   GLUCOSE 113* 05/16/2015 0530   BUN 53* 05/16/2015 0530   CREATININE 2.33* 05/16/2015 0530   CALCIUM 8.1* 05/16/2015 0530   GFRNONAA 33* 05/16/2015 0530   GFRAA 38* 05/16/2015 0530   CBC    Component Value Date/Time   WBC 19.2* 05/16/2015 0530   RBC 2.20* 05/16/2015 0530   HGB 7.2* 05/16/2015 0530   HCT 21.6* 05/16/2015 0530   PLT 84* 05/16/2015 0530   MCV 98.2 05/16/2015 0530   MCH 32.7 05/16/2015 0530   MCHC 33.3 05/16/2015 0530   RDW 15.8* 05/16/2015 0530   LYMPHSABS 1.6 05/12/2015 0405   MONOABS 1.0 05/12/2015 0405   EOSABS 0.6 05/12/2015 0405   BASOSABS 0.0 05/12/2015 0405  Assessment: 1.  ARF presumably due to ATN.  Baseline Scr .9 in 8/16.  UO has improved in last few days and now off CVVHD.  Scr higher today, will need to see where it levels out. 2. PNA/ARDS 3. Hypotension, improved 4. Anemia/thrombocytopenia.  Hg down to 7.2  Plan: 1. Wean levophed 2. Daily labs to see if renal fx is improving along with UO.       [name],[name]

## 2015-05-17 ENCOUNTER — Inpatient Hospital Stay (HOSPITAL_COMMUNITY): Payer: Medicare Other

## 2015-05-17 LAB — BLOOD GAS, ARTERIAL
Acid-Base Excess: 7.5 mmol/L — ABNORMAL HIGH (ref 0.0–2.0)
BICARBONATE: 31.1 meq/L — AB (ref 20.0–24.0)
DRAWN BY: 422461
FIO2: 0.3
O2 SAT: 94.6 %
PCO2 ART: 41.6 mmHg (ref 35.0–45.0)
PEEP: 5 cmH2O
PH ART: 7.488 — AB (ref 7.350–7.450)
Patient temperature: 37.5
RATE: 20 resp/min
TCO2: 29.6 mmol/L (ref 0–100)
VT: 490 mL
pO2, Arterial: 76.7 mmHg — ABNORMAL LOW (ref 80.0–100.0)

## 2015-05-17 LAB — PROTIME-INR
INR: 1.46 (ref 0.00–1.49)
PROTHROMBIN TIME: 17.8 s — AB (ref 11.6–15.2)

## 2015-05-17 LAB — PROCALCITONIN: PROCALCITONIN: 0.49 ng/mL

## 2015-05-17 LAB — GLUCOSE, CAPILLARY
GLUCOSE-CAPILLARY: 108 mg/dL — AB (ref 65–99)
GLUCOSE-CAPILLARY: 108 mg/dL — AB (ref 65–99)
GLUCOSE-CAPILLARY: 115 mg/dL — AB (ref 65–99)
GLUCOSE-CAPILLARY: 99 mg/dL (ref 65–99)
Glucose-Capillary: 107 mg/dL — ABNORMAL HIGH (ref 65–99)
Glucose-Capillary: 99 mg/dL (ref 65–99)

## 2015-05-17 LAB — BASIC METABOLIC PANEL
ANION GAP: 14 (ref 5–15)
BUN: 66 mg/dL — ABNORMAL HIGH (ref 6–20)
CALCIUM: 8 mg/dL — AB (ref 8.9–10.3)
CO2: 30 mmol/L (ref 22–32)
Chloride: 99 mmol/L — ABNORMAL LOW (ref 101–111)
Creatinine, Ser: 2.2 mg/dL — ABNORMAL HIGH (ref 0.61–1.24)
GFR, EST AFRICAN AMERICAN: 40 mL/min — AB (ref >60)
GFR, EST NON AFRICAN AMERICAN: 35 mL/min — AB (ref >60)
Glucose, Bld: 103 mg/dL — ABNORMAL HIGH (ref 65–99)
POTASSIUM: 2.7 mmol/L — AB (ref 3.5–5.1)
Sodium: 143 mmol/L (ref 135–145)

## 2015-05-17 LAB — CBC
HEMATOCRIT: 20.3 % — AB (ref 39.0–52.0)
Hemoglobin: 6.8 g/dL — CL (ref 13.0–17.0)
MCH: 33 pg (ref 26.0–34.0)
MCHC: 33.5 g/dL (ref 30.0–36.0)
MCV: 98.5 fL (ref 78.0–100.0)
PLATELETS: 150 10*3/uL (ref 150–400)
RBC: 2.06 MIL/uL — AB (ref 4.22–5.81)
RDW: 15.8 % — AB (ref 11.5–15.5)
WBC: 15.9 10*3/uL — AB (ref 4.0–10.5)

## 2015-05-17 LAB — ABO/RH: ABO/RH(D): O POS

## 2015-05-17 LAB — PREPARE RBC (CROSSMATCH)

## 2015-05-17 LAB — VANCOMYCIN, RANDOM
VANCOMYCIN RM: 35 ug/mL
Vancomycin Rm: 18 ug/mL

## 2015-05-17 LAB — PHOSPHORUS: PHOSPHORUS: 5 mg/dL — AB (ref 2.5–4.6)

## 2015-05-17 LAB — APTT: APTT: 29 s (ref 24–37)

## 2015-05-17 LAB — MAGNESIUM: MAGNESIUM: 2 mg/dL (ref 1.7–2.4)

## 2015-05-17 MED ORDER — POTASSIUM CHLORIDE 20 MEQ/15ML (10%) PO SOLN
40.0000 meq | Freq: Two times a day (BID) | ORAL | Status: AC
  Administered 2015-05-17 (×2): 40 meq
  Filled 2015-05-17 (×2): qty 30

## 2015-05-17 MED ORDER — SENNA 8.6 MG PO TABS
1.0000 | ORAL_TABLET | Freq: Every day | ORAL | Status: DC
  Administered 2015-05-17 – 2015-05-24 (×6): 8.6 mg via ORAL
  Filled 2015-05-17 (×6): qty 1

## 2015-05-17 MED ORDER — POTASSIUM CHLORIDE 10 MEQ/50ML IV SOLN
10.0000 meq | INTRAVENOUS | Status: AC
Start: 2015-05-17 — End: 2015-05-17
  Administered 2015-05-17 (×3): 10 meq via INTRAVENOUS
  Filled 2015-05-17 (×3): qty 50

## 2015-05-17 MED ORDER — SODIUM CHLORIDE 0.9 % IV SOLN: Freq: Once | INTRAVENOUS | Status: DC

## 2015-05-17 NOTE — Progress Notes (Signed)
eLink Physician-Brief Progress Note Patient Name: Eddie Williams DOB: September 02, 1970 MRN: [id number]   Date of Service  05/17/2015  HPI/Events of Note  hgb low on istat Repeat now stat plasma and type blood No active bleeding  eICU Interventions       Intervention Category Intermediate Interventions: Respiratory distress - evaluation and management;OtherNelda Bucks. 05/17/2015, 4:54 AM

## 2015-05-17 NOTE — Progress Notes (Signed)
Pt RN alerted to panic Hgb level of 6.9 g/dl at 16:10 on 9/60/45 by Dub Amis, RRT, RCP.

## 2015-05-17 NOTE — Progress Notes (Signed)
ABG drawn this morning. RT notified me of Hgb 6.9. Call placed to [facility], ordered type and screen as directed by Dr Tyson Alias.  Lab arrived to draw blood, central line is without blood return.  [phone] received call of critical value from lab, Hgb 6.8.  0629 transfusion order set placed by Tyson Alias, MD.

## 2015-05-17 NOTE — Progress Notes (Signed)
  of Dilaudid 0.5mg /ml wasted in sink Verl Blalock, RN

## 2015-05-17 NOTE — Progress Notes (Signed)
PULMONARY / CRITICAL CARE MEDICINE   Name: ROBERTLEE ROGACKI MRN: [id number] DOB: [birth year]    ADMISSION DATE:  05/07/2015  REFERRING MD :  [facility] ED  CHIEF COMPLAINT:  PNA, Acute respiratory distress  INITIAL PRESENTATION: 44 y/o man, hx polysubstance abuse, seizures, COPD / asthma, presented with B infiltrates and altered MS 9/17. Possible toxic ingestion of benzos and narcs. Progressed to respiratory failure and required intubation and MV, then hypotension with presumed septic shock.  Prolonged ICU course due to sedation / agitation needs.   STUDIES:  CXR 9/16 >> no infiltrates CXR 9/17 >> Bilateral infiltrates and consolidation, perihilar predominant but involving other fields as well CT Head 9/17 >> no acute intracranial abnormality. EEG 9/18 >> abnormal EEG with presence of generalized background slowing / encephalopathy.  NO seizures. ECHO 9/18 >> LVEF 55-60%, Grade 2 Diastolic dysfunction, LAE, RV mod dilated, systolic function mildly reduced, RA mod dilated, PA pressure 53 mm Hg  SIGNIFICANT EVENTS: 9/17  Intubation & Admission 9/17  Worsening mental status & questionable seizure activity 9/19  Self extubated x2, significant agitation  9/20  ETT above cords, pt able to make sounds on NP exam.  Tube adjusted and FOB at bedside for placement, BAL.   9/21  Severe vent dyssynchrony, started on pressure control ventilation 9/22  Sedate on 400 mcg fent, 8 versed, 48 propofol  9/23  heavy sedation with dilaudid, diprivan, versed  9/26  Good UOP, CVVHD stopped over the weekend, restarted on norepinephrine     SUBJECTIVE:   RN reports pt sedation turned off this am @ 750, remains calm.  Weaning on 10/5 in no distress. More alert. Girlfriend at bedside.  Anemia requiring transfusion overnight, 1 unit PRBC.  Levophed off this am with blood administration and sedation off.   VITAL SIGNS: Temp:  [98.8 F (37.1 C)-101.5 F (38.6 C)] 98.8 F (37.1 C) (09/27 0900) Pulse Rate:  [72-108] 94  (09/27 0900) Resp:  [0-31] 11 (09/27 0900) BP: (76-149)/(35-74) 111/68 mmHg (09/27 0900) SpO2:  [91 %-100 %] 100 % (09/27 0900) FiO2 (%):  [30 %] 30 % (09/27 0845) Weight:  [167 lb 15.9 oz (76.2 kg)] 167 lb 15.9 oz (76.2 kg) (09/27 0445)   HEMODYNAMICS: CVP:  [2 mmHg-46 mmHg] 11 mmHg   VENTILATOR SETTINGS: Vent Mode:  [-] CPAP;PSV FiO2 (%):  [30 %] 30 % Set Rate:  [20 bmp] 20 bmp Vt Set:  [490 mL] 490 mL PEEP:  [5 cmH20] 5 cmH20 Pressure Support:  [10 cmH20] 10 cmH20 Plateau Pressure:  [15 cmH20-22 cmH20] 16 cmH20   INTAKE / OUTPUT:  Intake/Output Summary (Last 24 hours) at 05/17/15 4098 Last data filed at 05/17/15 0900  Gross per 24 hour  Intake 2385.19 ml  Output   4390 ml  Net -2004.81 ml    PHYSICAL EXAMINATION: Gen: young adult male in NAD, calm on vent HENT: PERRL. Moist mucus membranes, OETT PULM: Clear anteriorly CV: s1s2 rrr, no m/r/g GI : Soft, + BS, ND Neuro: mild sedation, arouses, follows commands, MAE, calm    LABS:  CBC  Recent Labs Lab 05/15/15 0330  05/15/15 1217 05/16/15 0530 05/17/15 0555  WBC 16.0*  --   --  19.2* 15.9*  HGB 8.6*  < > 9.9* 7.2* 6.8*  HCT 25.0*  < > 29.0* 21.6* 20.3*  PLT 59*  --   --  84* 150  < > = values in this interval not displayed.   Coag's  Recent Labs Lab 05/14/15 0430 05/15/15  0330 05/17/15 0550  APTT 33 33 29  INR 1.39  --  1.46    BMET  Recent Labs Lab 05/15/15 1609 05/16/15 0530 05/17/15 0555  NA 141 143 143  K 3.7 3.5 2.7*  CL 98* 100* 99*  CO2 33* 33* 30  BUN 33* 53* 66*  CREATININE 1.52* 2.33* 2.20*  GLUCOSE 122* 113* 103*   Electrolytes  Recent Labs Lab 05/14/15 0430  05/15/15 0330 05/15/15 1609 05/16/15 0530 05/17/15 0555  CALCIUM 7.3*  < > 7.7*  7.6* 8.4* 8.1* 8.0*  MG 2.1  --  2.1  --   --  2.0  PHOS 4.3  < > 2.7  2.6 2.2* 3.7 5.0*  < > = values in this interval not displayed.   Sepsis Markers  Recent Labs Lab 05/15/15 0330 05/16/15 0530 05/17/15 0555   PROCALCITON 0.86 0.70 0.49   ABG  Recent Labs Lab 05/15/15 0338 05/16/15 0350 05/17/15 0422  PHART 7.451* 7.472* 7.488*  PCO2ART 45.0 41.1 41.6  PO2ART 95.4 86.5 76.7*   Liver Enzymes  Recent Labs Lab 05/14/15 1555 05/15/15 0330 05/15/15 1609  ALBUMIN 1.7* 1.9* 2.0*   Cardiac Enzymes No results for input(s): TROPONINI, PROBNP in the last 168 hours.   Glucose  Recent Labs Lab 05/16/15 1155 05/16/15 1543 05/16/15 2146 05/16/15 2339 05/17/15 0344 05/17/15 0743  GLUCAP 113* 110* 100* 108* 107* 99    Imaging 9/27 - improved but persistant bilateral opacities.  ASSESSMENT / PLAN:  PULMONARY OETT 9/17 >> 9/19, 9/19 >> A: ARDS -  Difficult to manage, severe vent dyssynchrony.  Improved compliance 9/22. Acute Hypercarbic Respiratory Failure HCAP vs aspiration PNA Self Extubation x2 - 9/19, bronch for placement but was in R mainstem.  CXR reviewed/measured 9/22, adjusted to 26 at teeth P:   Discontinue ARDS protocol  Daily SBT / WUA  Intermittent CXR  Continue precedex, dilaudid and versed drips.   CARDIOVASCULAR CVL L IJ CVC 9/17 >>  A:  Shock - Septic, worsening shock but no new source identified Pulmonary hypertension suggested on echo - most likely due to acute vasoconstriction in setting of ARDS VT - short run noted overnight 9/22 P:  Tele monitoring  Will need f/u echo as outpatient Levophed off 9/27 am, BP improved with PRBC administration and sedation off   RENAL A:   Acute Renal Failure - due to rhabdomyolysis & shock.  Required CVVHD.   Hypokalemia Hypernatremia Metabolic/Lactic Acidosis - trending q6hr Hypomagnesemia - Replacing IV. P:   Replace electrolytes as needed Good urine output. Hopeful for renal recovery Nephrology following, appreciate input Free water 200 ml Q8  GASTROINTESTINAL A:   Moderate Protein Calorie Malnutrition Constipation P:   Tube feeding per nutrition, on hold due to subglottic tube with concern for TF.    Consider restart PM 9/27 if not extubated Protonix IV q24hr D/C Pepcid with thrombocytopenia  Laxative PRN  HEMATOLOGIC A:   Mild Anemia - No signs of active bleeding Thrombocytopenia - suspect in setting of sepsis but also must consider HIT with heparin.  Improved 9/27 P:  Daily CBC Trend platelets daily w/ CBC D/C pepcid with thrombocytopenia SCDs Heparin [place] q8hr, dc'd 9/23 due to thrombocytopenia  INFECTIOUS A:   HCAP vs aspiration PNA > TMax 103.5 9/21 Fever - noted 9/19, tmax 102 P:    BCx2 9/17 >> neg  UC 9/17 >> neg Resp 9/17 >> E-Coli >> sens to aztreonam/imipenem FOB BAL 9/20 >> few yeast  Aztreonam 9/17 >> 9/22 Vanco  9/17 >> 9/19  Vanco 9/22 >>  Meropenem 9/22 >>    Consider 7 days abx, D6/x  ENDOCRINE: A:   Hyperglycemia - in setting of acute critical illness P:   SSI  Accuchecks q4hr  NEUROLOGIC A:   Suicide Attempt w/ Overdose (benzo, narcotic, cocaine) Acute encephalopathy - Toxic Metabolic + Overdose. Head CT negative Agitated Delirium / Difficult to achieve sedation Sedation for MV > needs high doses of fentanyl and versed and propofol H/O Seizure disorder - EEG 9/19 without evidence of seizure EtOH abuse P:   Seizure precautions RASS goal: -1 Methadone  BID Start precedex. Try to wean off Versed and dialudid gtt Thiamine, folate Psychiatry evaluation post extubation  FAMILY  - Updates: Mother updated 9/27 at bedside     Canary Brim, NP-C [facility] Pgr: [phone] or if no answer [phone] 05/17/2015, 9:14 AM

## 2015-05-17 NOTE — Progress Notes (Signed)
S: Intubated O:BP 125/72 mmHg  Pulse 89  Temp(Src) 101.7 F (38.7 C) (Core (Comment))  Resp 15  Ht  (1.753 m)  Wt 76.2 kg (167 lb 15.9 oz)  BMI 24.80 kg/m2  SpO2 100%  Intake/Output Summary (Last 24 hours) at 05/17/15 1208 Last data filed at 05/17/15 1115  Gross per 24 hour  Intake 2197.44 ml  Output   4040 ml  Net -1842.56 ml   Weight change: -3.6 kg (-7 lb 15 oz) JYN:[id number] but more alert CVS:RRR Resp: basilar crackles Abd: + BS NTND Ext: no edema NEURO: follows commands and moves all extremeties Rt IJ HD cath.  Lt IJ triple lumen   . sodium chloride   Intravenous Once  . chlorhexidine gluconate  15 mL Mouth Rinse BID  . feeding supplement (PRO-STAT SUGAR FREE 64)  30 mL Per Tube BID  . folic acid  1 mg Intravenous Daily  . free water  200 mL Per Tube 3 times per day  . furosemide  80 mg Intravenous Q8H  . meropenem (MERREM) IV  1 g Intravenous Q12H  . methadone  5 mg Oral Q12H  . pantoprazole (PROTONIX) IV  40 mg Intravenous Q24H  . potassium chloride  10 mEq Intravenous Q1 Hr x 3  . potassium chloride  40 mEq Per Tube BID  . senna  1 tablet Oral Daily  . thiamine IV  100 mg Intravenous Daily   Dg Chest Port 1 View  05/17/2015   CLINICAL DATA:  Acute respiratory failure.  Endotracheal tube.  EXAM: PORTABLE CHEST 1 VIEW  COMPARISON:  05/16/2015  FINDINGS: Endotracheal tube remains in place with tip approximately 4.2 cm above the carina. Bilateral jugular central venous catheters terminate over the upper SVC. Enteric tube courses into the upper abdomen with tip not imaged.  Cardiomediastinal silhouette is within normal limits. Mild, diffuse bilateral interstitial opacities as well as patchy airspace opacities most notable in the left lung base overall have not significantly changed. No sizable pleural effusion or pneumothorax is identified.  IMPRESSION: 1. Unchanged interstitial and patchy airspace opacities which may reflect edema or infection. 2. Unchanged  support devices.   Electronically Signed   By: Eddie Williams M.D.   On: 05/17/2015 07:31   Dg Chest Port 1 View  05/16/2015   CLINICAL DATA:  Acute respiratory failure with septic shock, history of asthma -COPD, current tobacco use  EXAM: PORTABLE CHEST 1 VIEW  COMPARISON:  Portable chest x-ray of May 15, 2015  FINDINGS: The lungs are adequately inflated. The interstitial markings remain increased bilaterally. The cardiac silhouette is normal in size. The pulmonary vascularity is not engorged. The mediastinum is normal in width. There is no pleural effusion. The endotracheal tube tip lies 4.8 cm above the carina. The esophagogastric tube tip projects below the inferior margin of the image. The you right internal jugular dual-lumen catheter tip projects over the proximal SVC.  IMPRESSION: Persistent interstitial prominence consistent with interstitial edema or pneumonia. No alveolar infiltrates are demonstrated.   Electronically Signed   By: Eddie  Williams M.D.   On: 05/16/2015 07:38   Dg Abd Portable 1v  05/17/2015   CLINICAL DATA:  43 year old male with constipation and abdominal discomfort. Subsequent encounter.  EXAM: PORTABLE ABDOMEN - 1 VIEW  COMPARISON:  05/09/2015.  FINDINGS: Nasogastric tube side all just beyond the gastroesophageal junction with the tip at the expected level of the gastric body unchanged in position from the prior exam.  Foley catheter in place.  Moderate stool throughout the descending colon with prominent amount of stool rectosigmoid colon.  The possibility of free intraperitoneal air cannot be assessed on a supine view.  IMPRESSION: Moderate stool throughout the descending colon with prominent amount of stool rectosigmoid colon.   Electronically Signed   By: Eddie Williams M.D.   On: 05/17/2015 09:16   BMET    Component Value Date/Time   NA 143 05/17/2015 0555   K 2.7* 05/17/2015 0555   CL 99* 05/17/2015 0555   CO2 30 05/17/2015 0555   GLUCOSE 103* 05/17/2015 0555    BUN 66* 05/17/2015 0555   CREATININE 2.20* 05/17/2015 0555   CALCIUM 8.0* 05/17/2015 0555   GFRNONAA 35* 05/17/2015 0555   GFRAA 40* 05/17/2015 0555   CBC    Component Value Date/Time   WBC 15.9* 05/17/2015 0555   RBC 2.06* 05/17/2015 0555   HGB 6.8* 05/17/2015 0555   HCT 20.3* 05/17/2015 0555   PLT 150 05/17/2015 0555   MCV 98.5 05/17/2015 0555   MCH 33.0 05/17/2015 0555   MCHC 33.5 05/17/2015 0555   RDW 15.8* 05/17/2015 0555   LYMPHSABS 1.6 05/12/2015 0405   MONOABS 1.0 05/12/2015 0405   EOSABS 0.6 05/12/2015 0405   BASOSABS 0.0 05/12/2015 0405     Assessment: 1.  ARF presumably due to ATN.  Baseline Scr .9 in 8/16.  UO is good and Scr stable to sl better compared to yest 2. PNA/ARDS 3. Hypotension, improved.  Off pressors 4. Anemia/thrombocytopenia.  Hg down to 6.8.  Plt improving 5. Hypokalemia  Plan: 1. Agree with transfusion 2.  Replace K as you are doing 3. Daily labs.  Hopefully he won'Williams require anymore HD     Eddie Williams

## 2015-05-18 ENCOUNTER — Inpatient Hospital Stay (HOSPITAL_COMMUNITY): Payer: Medicare Other

## 2015-05-18 LAB — BASIC METABOLIC PANEL
Anion gap: 11 (ref 5–15)
BUN: 70 mg/dL — AB (ref 6–20)
CO2: 36 mmol/L — AB (ref 22–32)
Calcium: 8.3 mg/dL — ABNORMAL LOW (ref 8.9–10.3)
Chloride: 101 mmol/L (ref 101–111)
Creatinine, Ser: 1.84 mg/dL — ABNORMAL HIGH (ref 0.61–1.24)
GFR calc Af Amer: 50 mL/min — ABNORMAL LOW (ref >60)
GFR, EST NON AFRICAN AMERICAN: 43 mL/min — AB (ref >60)
GLUCOSE: 106 mg/dL — AB (ref 65–99)
POTASSIUM: 2.9 mmol/L — AB (ref 3.5–5.1)
Sodium: 148 mmol/L — ABNORMAL HIGH (ref 135–145)

## 2015-05-18 LAB — CBC
HCT: 23 % — ABNORMAL LOW (ref 39.0–52.0)
HEMOGLOBIN: 7.4 g/dL — AB (ref 13.0–17.0)
MCH: 31.5 pg (ref 26.0–34.0)
MCHC: 32.2 g/dL (ref 30.0–36.0)
MCV: 97.9 fL (ref 78.0–100.0)
Platelets: 211 10*3/uL (ref 150–400)
RBC: 2.35 MIL/uL — AB (ref 4.22–5.81)
RDW: 16.2 % — ABNORMAL HIGH (ref 11.5–15.5)
WBC: 9.8 10*3/uL (ref 4.0–10.5)

## 2015-05-18 LAB — TYPE AND SCREEN
ABO/RH(D): O POS
ANTIBODY SCREEN: NEGATIVE
UNIT DIVISION: 0

## 2015-05-18 LAB — GLUCOSE, CAPILLARY
GLUCOSE-CAPILLARY: 109 mg/dL — AB (ref 65–99)
GLUCOSE-CAPILLARY: 115 mg/dL — AB (ref 65–99)
GLUCOSE-CAPILLARY: 133 mg/dL — AB (ref 65–99)
Glucose-Capillary: 103 mg/dL — ABNORMAL HIGH (ref 65–99)
Glucose-Capillary: 94 mg/dL (ref 65–99)
Glucose-Capillary: 86 mg/dL (ref 65–99)

## 2015-05-18 LAB — MAGNESIUM: Magnesium: 1.8 mg/dL (ref 1.7–2.4)

## 2015-05-18 MED ORDER — POTASSIUM CHLORIDE 20 MEQ/15ML (10%) PO SOLN
40.0000 meq | Freq: Once | ORAL | Status: AC
  Administered 2015-05-18: 40 meq
  Filled 2015-05-18: qty 30

## 2015-05-18 MED ORDER — SODIUM CHLORIDE 0.9 % IV SOLN
1.0000 g | Freq: Three times a day (TID) | INTRAVENOUS | Status: AC
  Administered 2015-05-18 – 2015-05-19 (×2): 1 g via INTRAVENOUS
  Filled 2015-05-18 (×3): qty 1

## 2015-05-18 MED ORDER — ANTISEPTIC ORAL RINSE SOLUTION (CORINZ)
7.0000 mL | Freq: Four times a day (QID) | OROMUCOSAL | Status: DC
  Administered 2015-05-18 – 2015-05-24 (×13): 7 mL via OROMUCOSAL

## 2015-05-18 MED ORDER — VANCOMYCIN HCL IN DEXTROSE 1-5 GM/200ML-% IV SOLN
1000.0000 mg | Freq: Every day | INTRAVENOUS | Status: AC
  Administered 2015-05-18 (×2): 1000 mg via INTRAVENOUS
  Filled 2015-05-18 (×2): qty 200

## 2015-05-18 MED ORDER — CHLORHEXIDINE GLUCONATE 0.12% ORAL RINSE (MEDLINE KIT)
15.0000 mL | Freq: Two times a day (BID) | OROMUCOSAL | Status: DC
  Administered 2015-05-18 – 2015-05-24 (×9): 15 mL via OROMUCOSAL

## 2015-05-18 MED ORDER — POTASSIUM CHLORIDE 10 MEQ/50ML IV SOLN
10.0000 meq | INTRAVENOUS | Status: AC
  Administered 2015-05-18 (×3): 10 meq via INTRAVENOUS
  Filled 2015-05-18: qty 50

## 2015-05-18 NOTE — Progress Notes (Signed)
ANTIBIOTIC CONSULT NOTE - FOLLOW UP  Pharmacy Consult for Vancomycin Indication: pneumonia  Allergies  Allergen Reactions  . Penicillins Anaphylaxis  . Ibuprofen Nausea Only  . Nsaids Nausea And Vomiting  . Erythromycin Rash    Patient Measurements: Height:  (175.3 cm) Weight: 167 lb 15.9 oz (76.2 kg) IBW/kg (Calculated) : 70.7 Adjusted Body Weight:   Vital Signs: Temp: 99.7 F (37.6 C) (09/28 0030) Temp Source: Core (Comment) (09/28 0000) BP: 80/40 mmHg (09/28 0030) Pulse Rate: 67 (09/28 0030) Intake/Output from previous day: 09/27 0701 - 09/28 0700 In: 1296.8 [I.V.:666.8; Blood:330; NG/GT:150; IV Piggyback:150] Out: 5000 [Urine:4400; Emesis/NG output:600] Intake/Output from this shift: Total I/O In: 257.9 [I.V.:257.9] Out: 1650 [Urine:1650]  Labs:  Recent Labs  05/15/15 0330  05/15/15 1217 05/15/15 1609 05/16/15 0530 05/17/15 0555  WBC 16.0*  --   --   --  19.2* 15.9*  HGB 8.6*  < > 9.9*  --  7.2* 6.8*  PLT 59*  --   --   --  84* 150  CREATININE 1.84*  1.92*  < > 1.70* 1.52* 2.33* 2.20*  < > = values in this interval not displayed. Estimated Creatinine Clearance: 43.3 mL/min (by C-G formula based on Cr of 2.2).  Recent Labs  05/16/15 1730 05/17/15 0550 05/17/15 2255  VANCOTROUGH 22*  --   --   VANCORANDOM  --  35 18     Microbiology: Recent Results (from the past 720 hour(s))  Culture, respiratory (NON-Expectorated)     Status: None   Collection Time: 05/07/15 11:54 AM  Result Value Ref Range Status   Specimen Description SPUTUM  Final   Special Requests NONE  Final   Gram Stain   Final    FEW WBC PRESENT, PREDOMINANTLY PMN FEW SQUAMOUS EPITHELIAL CELLS PRESENT MODERATE GRAM NEGATIVE RODS FEW GRAM POSITIVE RODS FEW YEAST    Culture   Final    ABUNDANT ESCHERICHIA COLI Performed at [facility]    Report Status 05/11/2015 FINAL  Final   Organism ID, Bacteria ESCHERICHIA COLI  Final      Susceptibility   Escherichia coli -  MIC*    AMPICILLIN >=32 RESISTANT Resistant     AMPICILLIN/SULBACTAM 4 SENSITIVE Sensitive     CEFEPIME <=1 SENSITIVE Sensitive     CEFTAZIDIME <=1 SENSITIVE Sensitive     CEFTRIAXONE <=1 SENSITIVE Sensitive     CIPROFLOXACIN >=4 RESISTANT Resistant     GENTAMICIN <=1 SENSITIVE Sensitive     IMIPENEM <=0.25 SENSITIVE Sensitive     PIP/TAZO <=4 SENSITIVE Sensitive     TOBRAMYCIN <=1 SENSITIVE Sensitive     TRIMETH/SULFA <=20 SENSITIVE Sensitive     AZTREONAM <=1 SENSITIVE Sensitive     * ABUNDANT ESCHERICHIA COLI  Culture, sputum-assessment     Status: None   Collection Time: 05/07/15  2:17 PM  Result Value Ref Range Status   Specimen Description SPUTUM  Final   Special Requests Immunocompromised  Final   Sputum evaluation   Final    THIS SPECIMEN IS ACCEPTABLE. RESPIRATORY CULTURE REPORT TO FOLLOW.   Report Status 05/07/2015 FINAL  Final  Urine culture     Status: None   Collection Time: 05/07/15  3:11 PM  Result Value Ref Range Status   Specimen Description URINE, CLEAN CATCH  Final   Special Requests Immunocompromised  Final   Culture   Final    NO GROWTH 2 DAYS Performed at [facility]    Report Status 05/09/2015 FINAL  Final  Blood Culture (routine x 2)     Status: None   Collection Time: 05/07/15  5:00 PM  Result Value Ref Range Status   Specimen Description BLOOD CENTRAL LINE  Final   Special Requests BOTTLES DRAWN AEROBIC AND ANAEROBIC 5 CC EACH  Final   Culture   Final    NO GROWTH 5 DAYS Performed at [facility]    Report Status 05/12/2015 FINAL  Final  Blood Culture (routine x 2)     Status: None   Collection Time: 05/07/15  5:50 PM  Result Value Ref Range Status   Specimen Description BLOOD RIGHT ARM  Final   Special Requests   Final    BOTTLES DRAWN AEROBIC AND ANAEROBIC IMMUNOCOMPROMISED   Culture   Final    NO GROWTH 5 DAYS Performed at [facility]    Report Status 05/13/2015 FINAL  Final  MRSA PCR Screening     Status:  None   Collection Time: 05/07/15  6:46 PM  Result Value Ref Range Status   MRSA by PCR NEGATIVE NEGATIVE Final    Comment:        The GeneXpert MRSA Assay (FDA approved for NASAL specimens only), is one component of a comprehensive MRSA colonization surveillance program. It is not intended to diagnose MRSA infection nor to guide or monitor treatment for MRSA infections.   Culture, bal-quantitative     Status: None   Collection Time: 05/10/15 11:32 AM  Result Value Ref Range Status   Specimen Description BRONCHIAL ALVEOLAR LAVAGE  Final   Special Requests Normal  Final   Gram Stain   Final    NO WBC SEEN NO SQUAMOUS EPITHELIAL CELLS SEEN NO ORGANISMS SEEN Performed at [facility]   Final    10,000 COLONIES/ML Performed at [facility]    Culture   Final    YEAST CONSISTENT WITH CANDIDA SPECIES Performed at [facility]    Report Status 05/12/2015 FINAL  Final    Anti-infectives    Start     Dose/Rate Route Frequency Ordered Stop   05/18/15 0100  vancomycin (VANCOCIN) IVPB 1000 mg/200 mL premix     1,000 mg 200 mL/hr over 60 Minutes Intravenous Daily at bedtime 05/18/15 0055     05/16/15 2200  vancomycin (VANCOCIN) IVPB 1000 mg/200 mL premix     1,000 mg 200 mL/hr over 60 Minutes Intravenous  Once 05/16/15 1831 05/16/15 2235   05/16/15 1800  meropenem (MERREM) 1 g in sodium chloride 0.9 % 100 mL IVPB     1 g 200 mL/hr over 30 Minutes Intravenous Every 12 hours 05/16/15 0742     05/15/15 2200  meropenem (MERREM) 1 g in sodium chloride 0.9 % 100 mL IVPB  Status:  Discontinued     1 g 200 mL/hr over 30 Minutes Intravenous 3 times per day 05/15/15 1817 05/16/15 0742   05/13/15 1800  vancomycin (VANCOCIN) IVPB 1000 mg/200 mL premix  Status:  Discontinued     1,000 mg 200 mL/hr over 60 Minutes Intravenous Every 24 hours 05/13/15 1038 05/16/15 0743   05/13/15 1400  meropenem (MERREM) 500 mg in sodium chloride 0.9 % 50 mL IVPB   Status:  Discontinued     500 mg 100 mL/hr over 30 Minutes Intravenous 3 times per day 05/13/15 1342 05/15/15 1816   05/13/15 1100  meropenem (MERREM) 500 mg in sodium chloride 0.9 % 50 mL IVPB  Status:  Discontinued  500 mg 100 mL/hr over 30 Minutes Intravenous Every 12 hours 05/13/15 1024 05/13/15 1342   05/13/15 1000  vancomycin (VANCOCIN) 1,250 mg in sodium chloride 0.9 % 250 mL IVPB  Status:  Discontinued     1,250 mg 166.7 mL/hr over 90 Minutes Intravenous Every 24 hours 05/12/15 1102 05/13/15 1037   05/12/15 1145  meropenem (MERREM) 1 g in sodium chloride 0.9 % 100 mL IVPB  Status:  Discontinued     1 g 200 mL/hr over 30 Minutes Intravenous Every 12 hours 05/12/15 1102 05/13/15 1023   05/12/15 1130  vancomycin (VANCOCIN) 1,500 mg in sodium chloride 0.9 % 500 mL IVPB     1,500 mg 250 mL/hr over 120 Minutes Intravenous  Once 05/12/15 1102 05/12/15 1333   05/10/15 1800  aztreonam (AZACTAM) 2 g in dextrose 5 % 50 mL IVPB  Status:  Discontinued     2 g 100 mL/hr over 30 Minutes Intravenous Every 8 hours 05/10/15 1300 05/12/15 0935   05/08/15 1530  vancomycin (VANCOCIN) 1,250 mg in sodium chloride 0.9 % 250 mL IVPB     1,250 mg 166.7 mL/hr over 90 Minutes Intravenous  Once 05/08/15 1509 05/08/15 1646   05/08/15 0200  aztreonam (AZACTAM) 1 g in dextrose 5 % 50 mL IVPB  Status:  Discontinued     1 g 100 mL/hr over 30 Minutes Intravenous Every 8 hours 05/07/15 1504 05/10/15 1300   05/07/15 1400  aztreonam (AZACTAM) 2 g in dextrose 5 % 50 mL IVPB     2 g 100 mL/hr over 30 Minutes Intravenous  Once 05/07/15 1354 05/07/15 1529   05/07/15 1400  vancomycin (VANCOCIN) IVPB 1000 mg/200 mL premix     1,000 mg 200 mL/hr over 60 Minutes Intravenous  Once 05/07/15 1354 05/07/15 1529      Assessment: Patient with vancomycin level 18.  Prior dose of 1gm 24hr before dose.  Goal of Therapy:  Vancomycin trough level 15-20 mcg/ml  Plan:  Measure antibiotic drug levels at steady state Follow  up culture results Vancomycin 1gm iv q24hr  Darlina Guys, Julian Crowford 05/18/2015,12:56 AM

## 2015-05-18 NOTE — Progress Notes (Signed)
Nutrition Follow-up  DOCUMENTATION CODES:   Non-severe (moderate) malnutrition in context of chronic illness  INTERVENTION:   Diet advancement per MD pending swallowing eval RD to continue to monitor for needs post extubation  NUTRITION DIAGNOSIS:   Inadequate oral intake related to inability to eat as evidenced by NPO status.  Ongoing.  GOAL:   Patient will meet greater than or equal to 90% of their needs  Not meeting.  MONITOR:   Diet advancement, Labs, Weight trends, Skin, I & O's  ASSESSMENT:   44 yo man, hx polysubstance abuse, seizures, COPD / asthma, presented with B infiltrates and altered MS 9/17. Possible toxic ingestion of benzos and narcs. Progressed to respiratory failure and required intubation and MV, then hypotension presumed septic shock.  Pt extubated today 9/28. Pt is awaiting swallow evaluation. RD awaiting results and will monitor for any future needs.  Labs reviewed: CBGs: 109-133 Elevated Na, BUN, Creatinine Low K Mg WNL  Diet Order:  Diet NPO time specified Except for: Ice Chips  Skin:  Wound (see comment) (R knee and R elbow wounds)  Last BM:  9/28  Height:   Ht Readings from Last 1 Encounters:  05/07/15  (1.753 m)    Weight:   Wt Readings from Last 1 Encounters:  05/18/15 160 lb 11.5 oz (72.9 kg)    Ideal Body Weight:  72.7 kg  BMI:  Body mass index is 23.72 kg/(m^2).  Estimated Nutritional Needs:   Kcal:  1800-2000  Protein:  90-100g  Fluid:  2L/day  EDUCATION NEEDS:   No education needs identified at this time  Tilda Franco, MS, RD, LDN Pager: [phone] After Hours Pager: [phone]

## 2015-05-18 NOTE — Progress Notes (Signed)
eLink Physician-Brief Progress Note Patient Name: Eddie Williams DOB: 1971-06-25 MRN: [id number]   Date of Service  05/18/2015  HPI/Events of Note  Low k   eICU Interventions  suopp     Intervention Category Intermediate Interventions: Electrolyte abnormality - evaluation and management  [name]. 05/18/2015, 6:28 AM

## 2015-05-18 NOTE — Progress Notes (Signed)
PULMONARY / CRITICAL CARE MEDICINE   Name: Eddie Williams MRN: [id number] DOB: [birth year]    ADMISSION DATE:  05/07/2015  REFERRING MD :  [facility] ED  CHIEF COMPLAINT:  PNA, Acute respiratory distress  INITIAL PRESENTATION: 44 y/o man, hx polysubstance abuse, seizures, COPD / asthma, presented with B infiltrates and altered MS 9/17. Possible toxic ingestion of benzos and narcs. Progressed to respiratory failure and required intubation and MV, then hypotension with presumed septic shock.  Prolonged ICU course due to sedation / agitation needs.   STUDIES:  CXR 9/16 >> no infiltrates CXR 9/17 >> Bilateral infiltrates and consolidation, perihilar predominant but involving other fields as well CT Head 9/17 >> no acute intracranial abnormality. EEG 9/18 >> abnormal EEG with presence of generalized background slowing / encephalopathy.  NO seizures. ECHO 9/18 >> LVEF 55-60%, Grade 2 Diastolic dysfunction, LAE, RV mod dilated, systolic function mildly reduced, RA mod dilated, PA pressure 53 mm Hg  SIGNIFICANT EVENTS: 9/17  Intubation & Admission 9/17  Worsening mental status & questionable seizure activity 9/19  Self extubated x2, significant agitation  9/20  ETT above cords, pt able to make sounds on NP exam.  Tube adjusted and FOB at bedside for placement, BAL.   9/21  Severe vent dyssynchrony, started on pressure control ventilation 9/22  Sedate on 400 mcg fent, 8 versed, 48 propofol  9/23  heavy sedation with dilaudid, diprivan, versed  9/26  Good UOP, CVVHD stopped over the weekend, restarted on norepinephrine  9/27  ARDS protocol d/c'd, weaning, low grade fever, anemia s/p 1 unit PRBC 9/28  Extubated     SUBJECTIVE:    Pt passed SBT on wean.  No distress/calm.  Extubated to [place] O2.  Fever/WBC improved.  Sr Cr improving.   VITAL SIGNS: Temp:  [93.4 F (34.1 C)-101.5 F (38.6 C)] 99.3 F (37.4 C) (09/28 1133) Pulse Rate:  [63-102] 83 (09/28 1133) Resp:  [0-34] 27 (09/28 1133) BP:  (79-149)/(31-110) 124/71 mmHg (09/28 1100) SpO2:  [98 %-100 %] 100 % (09/28 1133) FiO2 (%):  [30 %] 30 % (09/28 0730) Weight:  [160 lb 11.5 oz (72.9 kg)] 160 lb 11.5 oz (72.9 kg) (09/28 0400)   HEMODYNAMICS:     VENTILATOR SETTINGS: Vent Mode:  [-] PRVC FiO2 (%):  [30 %] 30 % Set Rate:  [16 bmp] 16 bmp Vt Set:  [490 mL] 490 mL PEEP:  [5 cmH20] 5 cmH20 Plateau Pressure:  [13 cmH20-16 cmH20] 14 cmH20   INTAKE / OUTPUT:  Intake/Output Summary (Last 24 hours) at 05/18/15 1145 Last data filed at 05/18/15 1130  Gross per 24 hour  Intake 1854.72 ml  Output   6645 ml  Net -4790.28 ml    PHYSICAL EXAMINATION: Gen: young adult male in NAD HENT: PERRL. Moist mucus membranes, edentulous  PULM: Clear anteriorly CV: s1s2 rrr, no m/r/g GI : Soft, + BS, ND Neuro: Awake, alert, speech clear, generalized weakness but MAE    LABS:  CBC  Recent Labs Lab 05/16/15 0530 05/17/15 0555 05/18/15 0450  WBC 19.2* 15.9* 9.8  HGB 7.2* 6.8* 7.4*  HCT 21.6* 20.3* 23.0*  PLT 84* 150 211    Coag's  Recent Labs Lab 05/14/15 0430 05/15/15 0330 05/17/15 0550  APTT 33 33 29  INR 1.39  --  1.46    BMET  Recent Labs Lab 05/16/15 0530 05/17/15 0555 05/18/15 0450  NA 143 143 148*  K 3.5 2.7* 2.9*  CL 100* 99* 101  CO2 33* 30 36*  BUN 53* 66* 70*  CREATININE 2.33* 2.20* 1.84*  GLUCOSE 113* 103* 106*   Electrolytes  Recent Labs Lab 05/15/15 0330 05/15/15 1609 05/16/15 0530 05/17/15 0555 05/18/15 0450  CALCIUM 7.7*  7.6* 8.4* 8.1* 8.0* 8.3*  MG 2.1  --   --  2.0 1.8  PHOS 2.7  2.6 2.2* 3.7 5.0*  --      Sepsis Markers  Recent Labs Lab 05/15/15 0330 05/16/15 0530 05/17/15 0555  PROCALCITON 0.86 0.70 0.49   ABG  Recent Labs Lab 05/15/15 0338 05/16/15 0350 05/17/15 0422  PHART 7.451* 7.472* 7.488*  PCO2ART 45.0 41.1 41.6  PO2ART 95.4 86.5 76.7*   Liver Enzymes  Recent Labs Lab 05/14/15 1555 05/15/15 0330 05/15/15 1609  ALBUMIN 1.7* 1.9* 2.0*    Cardiac Enzymes No results for input(s): TROPONINI, PROBNP in the last 168 hours.   Glucose  Recent Labs Lab 05/17/15 1157 05/17/15 1604 05/17/15 2002 05/17/15 2346 05/18/15 0323 05/18/15 0745  GLUCAP 115* 108* 99 115* 133* 109*    Imaging 9/27 - improved but persistant bilateral opacities.  ASSESSMENT / PLAN:  PULMONARY OETT 9/17 >> 9/19, 9/19 >> A: ARDS -  Difficult to manage, severe vent dyssynchrony.  Improved compliance 9/22.  Extubated 9/28.   Acute Hypercarbic Respiratory Failure - resolved HCAP vs aspiration PNA Self Extubation x2 - 9/19, bronch for placement but was in R mainstem.  CXR reviewed/measured 9/22, adjusted to 26 at teeth P:   Pulmonary hygiene:  IS, mobilize Intermittent CXR  Continue precedex gtt for now, wean to off  CARDIOVASCULAR CVL L IJ CVC 9/17 >>  A:  Shock - Septic, worsening shock but no new source identified Pulmonary hypertension suggested on Echo - most likely due to acute vasoconstriction in setting of ARDS VT - short run noted overnight 9/22 P:  Tele monitoring  Will need f/u echo as outpatient Levophed off 9/27 am, BP improved with PRBC administration and sedation off   RENAL A:   Acute Renal Failure - due to rhabdomyolysis & shock.  Required CVVHD.   Hypokalemia Hypernatremia Metabolic/Lactic Acidosis - trending q6hr Hypomagnesemia - Replacing IV. P:   Trend BMP / UOP Replace electrolytes as indicated Good urine output. Improving sr creatinine  Nephrology following, appreciate input  GASTROINTESTINAL A:   Moderate Protein Calorie Malnutrition Constipation P:   NPO post extubation, SLP eval pending - pt coughed with ice chips Protonix IV q24hr Laxative PRN  HEMATOLOGIC A:   Mild Anemia - No signs of active bleeding Thrombocytopenia - suspect in setting of sepsis but also must consider HIT with heparin.  Improved 9/27 P:  Daily CBC D/C pepcid with thrombocytopenia SCDs Heparin Suring q8hr, dc'd 9/23 due to  thrombocytopenia  INFECTIOUS A:   HCAP vs aspiration PNA > TMax 103.5 9/21 Fever - noted 9/19, tmax 102 P:   BCx2 9/17 >> neg  UC 9/17 >> neg Resp 9/17 >> E-Coli >> sens to aztreonam/imipenem FOB BAL 9/20 >> few yeast  Aztreonam 9/17 >> 9/22 Vanco 9/17 >> 9/19  Vanco 9/22 >> 9/28 Meropenem 9/22 >> 9/28  Consider 7 days abx, D7/7 on 9/28.  Stop time in place.   ENDOCRINE: A:   Hyperglycemia - in setting of acute critical illness P:   SSI  Accuchecks q4hr  NEUROLOGIC A:   Accidental Overdose - benzo, narcotic, cocaine.  Patient denies suicidal intent. Acute encephalopathy - Toxic Metabolic + Overdose. Head CT negative Agitated Delirium / Difficult to achieve sedation - resolved Sedation for MV >  needed high doses of fentanyl and versed and propofol H/O Seizure disorder - EEG 9/19 without evidence of seizure EtOH abuse P:   Seizure precautions RASS goal: 0 Methadone  BID Wean precedex gtt to off 9/28 Thiamine, folate    FAMILY  - Updates: Mother updated 9/28 at bedside     Canary Brim, NP-C [facility] Pgr: [phone] or if no answer [phone] 05/18/2015, 11:45 AM

## 2015-05-18 NOTE — Progress Notes (Signed)
ANTIBIOTIC CONSULT NOTE   Pharmacy Consult for Vancomycin and Meropenem Indication: HCAP  Allergies  Allergen Reactions  . Penicillins Anaphylaxis  . Ibuprofen Nausea Only  . Nsaids Nausea And Vomiting  . Erythromycin Rash   Patient Measurements: Height:  (175.3 cm) Weight: 160 lb 11.5 oz (72.9 kg) IBW/kg (Calculated) : 70.7  Vital Signs: Temp: 99.5 F (37.5 C) (09/28 0800) Temp Source: Core (Comment) (09/28 0700) BP: 119/66 mmHg (09/28 0715) Pulse Rate: 73 (09/28 0800) Intake/Output from previous day: 09/27 0701 - 09/28 0700 In: 2013.5 [I.V.:783.5; Blood:330; NG/GT:550; IV Piggyback:350] Out: 5850 [Urine:5250; Emesis/NG output:600] Intake/Output from this shift: Total I/O In: 80 [NG/GT:80] Out: 195 [Urine:195]  Labs:  Recent Labs  05/16/15 0530 05/17/15 0555 05/18/15 0450  WBC 19.2* 15.9* 9.8  HGB 7.2* 6.8* 7.4*  PLT 84* 150 211  CREATININE 2.33* 2.20* 1.84*   Estimated Creatinine Clearance: 51.8 mL/min (by C-G formula based on Cr of 1.84).  Recent Labs  05/16/15 1730 05/17/15 0550 05/17/15 2255  VANCOTROUGH 22*  --   --   VANCORANDOM  --  35 18     Microbiology: Recent Results (from the past 720 hour(s))  Culture, respiratory (NON-Expectorated)     Status: None   Collection Time: 05/07/15 11:54 AM  Result Value Ref Range Status   Specimen Description SPUTUM  Final   Special Requests NONE  Final   Gram Stain   Final    FEW WBC PRESENT, PREDOMINANTLY PMN FEW SQUAMOUS EPITHELIAL CELLS PRESENT MODERATE GRAM NEGATIVE RODS FEW GRAM POSITIVE RODS FEW YEAST    Culture   Final    ABUNDANT ESCHERICHIA COLI Performed at [facility]    Report Status 05/11/2015 FINAL  Final   Organism ID, Bacteria ESCHERICHIA COLI  Final      Susceptibility   Escherichia coli - MIC*    AMPICILLIN >=32 RESISTANT Resistant     AMPICILLIN/SULBACTAM 4 SENSITIVE Sensitive     CEFEPIME <=1 SENSITIVE Sensitive     CEFTAZIDIME <=1 SENSITIVE Sensitive    CEFTRIAXONE <=1 SENSITIVE Sensitive     CIPROFLOXACIN >=4 RESISTANT Resistant     GENTAMICIN <=1 SENSITIVE Sensitive     IMIPENEM <=0.25 SENSITIVE Sensitive     PIP/TAZO <=4 SENSITIVE Sensitive     TOBRAMYCIN <=1 SENSITIVE Sensitive     TRIMETH/SULFA <=20 SENSITIVE Sensitive     AZTREONAM <=1 SENSITIVE Sensitive     * ABUNDANT ESCHERICHIA COLI  Culture, sputum-assessment     Status: None   Collection Time: 05/07/15  2:17 PM  Result Value Ref Range Status   Specimen Description SPUTUM  Final   Special Requests Immunocompromised  Final   Sputum evaluation   Final    THIS SPECIMEN IS ACCEPTABLE. RESPIRATORY CULTURE REPORT TO FOLLOW.   Report Status 05/07/2015 FINAL  Final  Urine culture     Status: None   Collection Time: 05/07/15  3:11 PM  Result Value Ref Range Status   Specimen Description URINE, CLEAN CATCH  Final   Special Requests Immunocompromised  Final   Culture   Final    NO GROWTH 2 DAYS Performed at [facility]    Report Status 05/09/2015 FINAL  Final  Blood Culture (routine x 2)     Status: None   Collection Time: 05/07/15  5:00 PM  Result Value Ref Range Status   Specimen Description BLOOD CENTRAL LINE  Final   Special Requests BOTTLES DRAWN AEROBIC AND ANAEROBIC 5 CC EACH  Final   Culture  Final    NO GROWTH 5 DAYS Performed at [facility]    Report Status 05/12/2015 FINAL  Final  Blood Culture (routine x 2)     Status: None   Collection Time: 05/07/15  5:50 PM  Result Value Ref Range Status   Specimen Description BLOOD RIGHT ARM  Final   Special Requests   Final    BOTTLES DRAWN AEROBIC AND ANAEROBIC IMMUNOCOMPROMISED   Culture   Final    NO GROWTH 5 DAYS Performed at [facility]    Report Status 05/13/2015 FINAL  Final  MRSA PCR Screening     Status: None   Collection Time: 05/07/15  6:46 PM  Result Value Ref Range Status   MRSA by PCR NEGATIVE NEGATIVE Final    Comment:        The GeneXpert MRSA Assay (FDA approved for  NASAL specimens only), is one component of a comprehensive MRSA colonization surveillance program. It is not intended to diagnose MRSA infection nor to guide or monitor treatment for MRSA infections.   Culture, bal-quantitative     Status: None   Collection Time: 05/10/15 11:32 AM  Result Value Ref Range Status   Specimen Description BRONCHIAL ALVEOLAR LAVAGE  Final   Special Requests Normal  Final   Gram Stain   Final    NO WBC SEEN NO SQUAMOUS EPITHELIAL CELLS SEEN NO ORGANISMS SEEN Performed at [facility]   Final    10,000 COLONIES/ML Performed at [facility]    Culture   Final    YEAST CONSISTENT WITH CANDIDA SPECIES Performed at [facility]    Report Status 05/12/2015 FINAL  Final    Anti-infectives    Start     Dose/Rate Route Frequency Ordered Stop   05/18/15 1400  meropenem (MERREM) 1 g in sodium chloride 0.9 % 100 mL IVPB     1 g 200 mL/hr over 30 Minutes Intravenous 3 times per day 05/18/15 1006     05/18/15 0100  vancomycin (VANCOCIN) IVPB 1000 mg/200 mL premix     1,000 mg 200 mL/hr over 60 Minutes Intravenous Daily at bedtime 05/18/15 0055     05/16/15 2200  vancomycin (VANCOCIN) IVPB 1000 mg/200 mL premix     1,000 mg 200 mL/hr over 60 Minutes Intravenous  Once 05/16/15 1831 05/16/15 2235   05/16/15 1800  meropenem (MERREM) 1 g in sodium chloride 0.9 % 100 mL IVPB  Status:  Discontinued     1 g 200 mL/hr over 30 Minutes Intravenous Every 12 hours 05/16/15 0742 05/18/15 1006   05/15/15 2200  meropenem (MERREM) 1 g in sodium chloride 0.9 % 100 mL IVPB  Status:  Discontinued     1 g 200 mL/hr over 30 Minutes Intravenous 3 times per day 05/15/15 1817 05/16/15 0742   05/13/15 1800  vancomycin (VANCOCIN) IVPB 1000 mg/200 mL premix  Status:  Discontinued     1,000 mg 200 mL/hr over 60 Minutes Intravenous Every 24 hours 05/13/15 1038 05/16/15 0743   05/13/15 1400  meropenem (MERREM) 500 mg in sodium chloride 0.9 % 50  mL IVPB  Status:  Discontinued     500 mg 100 mL/hr over 30 Minutes Intravenous 3 times per day 05/13/15 1342 05/15/15 1816   05/13/15 1100  meropenem (MERREM) 500 mg in sodium chloride 0.9 % 50 mL IVPB  Status:  Discontinued     500 mg 100 mL/hr over 30 Minutes Intravenous  Every 12 hours 05/13/15 1024 05/13/15 1342   05/13/15 1000  vancomycin (VANCOCIN) 1,250 mg in sodium chloride 0.9 % 250 mL IVPB  Status:  Discontinued     1,250 mg 166.7 mL/hr over 90 Minutes Intravenous Every 24 hours 05/12/15 1102 05/13/15 1037   05/12/15 1145  meropenem (MERREM) 1 g in sodium chloride 0.9 % 100 mL IVPB  Status:  Discontinued     1 g 200 mL/hr over 30 Minutes Intravenous Every 12 hours 05/12/15 1102 05/13/15 1023   05/12/15 1130  vancomycin (VANCOCIN) 1,500 mg in sodium chloride 0.9 % 500 mL IVPB     1,500 mg 250 mL/hr over 120 Minutes Intravenous  Once 05/12/15 1102 05/12/15 1333   05/10/15 1800  aztreonam (AZACTAM) 2 g in dextrose 5 % 50 mL IVPB  Status:  Discontinued     2 g 100 mL/hr over 30 Minutes Intravenous Every 8 hours 05/10/15 1300 05/12/15 0935   05/08/15 1530  vancomycin (VANCOCIN) 1,250 mg in sodium chloride 0.9 % 250 mL IVPB     1,250 mg 166.7 mL/hr over 90 Minutes Intravenous  Once 05/08/15 1509 05/08/15 1646   05/08/15 0200  aztreonam (AZACTAM) 1 g in dextrose 5 % 50 mL IVPB  Status:  Discontinued     1 g 100 mL/hr over 30 Minutes Intravenous Every 8 hours 05/07/15 1504 05/10/15 1300   05/07/15 1400  aztreonam (AZACTAM) 2 g in dextrose 5 % 50 mL IVPB     2 g 100 mL/hr over 30 Minutes Intravenous  Once 05/07/15 1354 05/07/15 1529   05/07/15 1400  vancomycin (VANCOCIN) IVPB 1000 mg/200 mL premix     1,000 mg 200 mL/hr over 60 Minutes Intravenous  Once 05/07/15 1354 05/07/15 1529     Assessment: 44yo M w/ SOB and wheezing (possible ingestion BZDs) Recent admission and ED visits with similar sxs, but left AMA on 9/16. CXR on 9/16 was clear, CXR ion 9/17 shows HCAP + ALI vs pulm  edema. Pharmacy dosed Vanc and Aztreonam for HCAP. First doses given in the ED. Broadened to Vanc/Mero as pt has seen no imp. on Azactam 1g or 2g dose, renal function worsened, WBCs trend up, fevers continue, inc pressor need, unchanged CXR.  Today, 05/18/2015: Tmax 101.7, HR 63-123, RR 33 WBC: 9.8, Renal: AKI taken off CRRT on 9/25>> SCr 1.84, CrCl 51.8 PCT level reduced >80% from peak  Anti-Infectives 9/17 >> Aztreonam >> 9/22 9/17 >> Vanc >> 9/19; restart 9/22 9/22 >> Meropenem >>  Cultures 9/17 blood x2: NGTD 9/17 urine: NGF 9/17 sputum: abundant E-coli >> S: rocephin, imipenem, bactrim, aztreonam. R: cipro, ampicillin 9/20 BAL: 10k yeast, consistent with Candida  Dose changes/levels: - See earlier notes for vancomycin dosing - previously dosing by levels with earlier AKI 9/21: Aztreonam inc to 2g IV q8h based on imp. renal function, body wt, and new fevers/leukocytosis 9/23: Meropenem to 500mg  q12, Vancomycin to 1000mg  q24h for worsening renal function 9/25: CRRT d/c'd >> Meropenem to 1g q8h, Vanc 1g  q24h 9/26: VR = 31 (11h post 1g) 9/26: VT = 22 9/27: VR = 35 (8h post 1g) 9/27: VT = 18 (25h post 1g) 9/28: Increase Meropenem to 1g q8h for CrCl >50  Goal of Therapy:  Vancomycin trough level 15-20 mcg/ml  Appropriate antibiotic dosing for renal function; eradication of infection  Plan: (Day 7 on Vanc/Meropenem) --Continue Vancomycin 1g q24h --Change to Meropenem 1g q8h --F/U CBC, SCr, Clinical Criteria Vanc Trough, and Length of Therapy  Kathlynn Grate, PharmD Candidate  05/18/2015, 10:11 AM

## 2015-05-18 NOTE — Progress Notes (Signed)
Wasted 55mL midazolam  ( /mL) down the sink with Chales Salmon, RN. Milon Dikes, RN 05/18/2015 2:10 PM

## 2015-05-18 NOTE — Progress Notes (Signed)
S: Intubated O:BP 116/74 mmHg  Pulse 82  Temp(Src) 99.5 F (37.5 C) (Core (Comment))  Resp 4  Ht  (1.753 m)  Wt 72.9 kg (160 lb 11.5 oz)  BMI 23.72 kg/m2  SpO2 100%  Intake/Output Summary (Last 24 hours) at 05/18/15 1029 Last data filed at 05/18/15 1000  Gross per 24 hour  Intake 2093.42 ml  Output   6245 ml  Net -4151.58 ml   Weight change: -3.3 kg (-7 lb 4.4 oz) ZOX:[id number] but more alert CVS:RRR Resp: basilar crackles Abd: + BS NTND Ext: no edema NEURO: follows commands and moves all extremeties Rt IJ HD cath.  Lt IJ triple lumen   . sodium chloride   Intravenous Once  . antiseptic oral rinse  7 mL Mouth Rinse QID  . chlorhexidine gluconate  15 mL Mouth Rinse BID  . feeding supplement (PRO-STAT SUGAR FREE 64)  30 mL Per Tube BID  . folic acid  1 mg Intravenous Daily  . free water  200 mL Per Tube 3 times per day  . furosemide  80 mg Intravenous Q8H  . meropenem (MERREM) IV  1 g Intravenous 3 times per day  . methadone  5 mg Oral Q12H  . pantoprazole (PROTONIX) IV  40 mg Intravenous Q24H  . senna  1 tablet Oral Daily  . thiamine IV  100 mg Intravenous Daily  . vancomycin  1,000 mg Intravenous QHS   Dg Chest Port 1 View  05/18/2015   CLINICAL DATA:  ARDS  EXAM: PORTABLE CHEST 1 VIEW  COMPARISON:  05/17/2015  FINDINGS: Support devices are stable. Heart is normal size. Stable interstitial prominence and patchy airspace disease, most pronounced in the upper lobes and left base. No effusions. No acute bony abnormality. No real change.  IMPRESSION: Stable interstitial prominence and patchy bilateral airspace disease, edema versus infection. No change.   Electronically Signed   By: Charlett Nose M.D.   On: 05/18/2015 07:21   Dg Chest Port 1 View  05/17/2015   CLINICAL DATA:  Acute respiratory failure.  Endotracheal tube.  EXAM: PORTABLE CHEST 1 VIEW  COMPARISON:  05/16/2015  FINDINGS: Endotracheal tube remains in place with tip approximately 4.2 cm above the carina.  Bilateral jugular central venous catheters terminate over the upper SVC. Enteric tube courses into the upper abdomen with tip not imaged.  Cardiomediastinal silhouette is within normal limits. Mild, diffuse bilateral interstitial opacities as well as patchy airspace opacities most notable in the left lung base overall have not significantly changed. No sizable pleural effusion or pneumothorax is identified.  IMPRESSION: 1. Unchanged interstitial and patchy airspace opacities which may reflect edema or infection. 2. Unchanged support devices.   Electronically Signed   By: Sebastian Ache M.D.   On: 05/17/2015 07:31   Dg Abd Portable 1v  05/17/2015   CLINICAL DATA:  44 year old male with constipation and abdominal discomfort. Subsequent encounter.  EXAM: PORTABLE ABDOMEN - 1 VIEW  COMPARISON:  05/09/2015.  FINDINGS: Nasogastric tube side all just beyond the gastroesophageal junction with the tip at the expected level of the gastric body unchanged in position from the prior exam.  Foley catheter in place.  Moderate stool throughout the descending colon with prominent amount of stool rectosigmoid colon.  The possibility of free intraperitoneal air cannot be assessed on a supine view.  IMPRESSION: Moderate stool throughout the descending colon with prominent amount of stool rectosigmoid colon.   Electronically Signed   By: Lacy Duverney M.D.   On:  05/17/2015 09:16   BMET    Component Value Date/Time   NA 148* 05/18/2015 0450   K 2.9* 05/18/2015 0450   CL 101 05/18/2015 0450   CO2 36* 05/18/2015 0450   GLUCOSE 106* 05/18/2015 0450   BUN 70* 05/18/2015 0450   CREATININE 1.84* 05/18/2015 0450   CALCIUM 8.3* 05/18/2015 0450   GFRNONAA 43* 05/18/2015 0450   GFRAA 50* 05/18/2015 0450   CBC    Component Value Date/Time   WBC 9.8 05/18/2015 0450   RBC 2.35* 05/18/2015 0450   HGB 7.4* 05/18/2015 0450   HCT 23.0* 05/18/2015 0450   PLT 211 05/18/2015 0450   MCV 97.9 05/18/2015 0450   MCH 31.5 05/18/2015  0450   MCHC 32.2 05/18/2015 0450   RDW 16.2* 05/18/2015 0450   LYMPHSABS 1.6 05/12/2015 0405   MONOABS 1.0 05/12/2015 0405   EOSABS 0.6 05/12/2015 0405   BASOSABS 0.0 05/12/2015 0405     Assessment: 1.  ARF presumably due to ATN.  Baseline Scr .9 in 8/16.  UO is excellent and Scr improving 2. PNA/ARDS 3. Hypotension, resolved 4. Anemia/thrombocytopenia.  Hg 7.4 after 1 unit blood.  Plt now NL 5. Hypokalemia 6. Mild Hypernatremia  Plan: 1. Hold lasix for today and resume PRN 2. Replace K 3. Since renal fx is improving and should cont to do so, I will sign off.  Please call if further renal issues    [name],[name]

## 2015-05-18 NOTE — Procedures (Signed)
Extubation Procedure Note  Patient Details:   Name: ARTHOR GORTER DOB: [birth year] MRN: [id number]   Airway Documentation:     Evaluation  O2 sats: 100 Complications: none Patient tolerated procedure well. Bilateral Breath Sounds: Coarse crackles Suctioning: Airway Pt able to speak  Per verbal order from CCM, extubated pt and placed on nasal cannula.  Pt tolerated well, no complications.  Revonda Humphrey 05/18/2015, 10:47 AM

## 2015-05-18 NOTE — Progress Notes (Addendum)
Wasted 45mL of Hydromorphone  (0.5mg /mL) down the sink with Chales Salmon, RN. Milon Dikes, RN 1:59 PM 05/18/2015

## 2015-05-19 ENCOUNTER — Inpatient Hospital Stay (HOSPITAL_COMMUNITY): Payer: Medicare Other

## 2015-05-19 LAB — RENAL FUNCTION PANEL
ALBUMIN: 2.7 g/dL — AB (ref 3.5–5.0)
Anion gap: 13 (ref 5–15)
BUN: 62 mg/dL — AB (ref 6–20)
CHLORIDE: 107 mmol/L (ref 101–111)
CO2: 34 mmol/L — ABNORMAL HIGH (ref 22–32)
CREATININE: 1.4 mg/dL — AB (ref 0.61–1.24)
Calcium: 8.5 mg/dL — ABNORMAL LOW (ref 8.9–10.3)
Glucose, Bld: 99 mg/dL (ref 65–99)
PHOSPHORUS: 4.1 mg/dL (ref 2.5–4.6)
POTASSIUM: 3.1 mmol/L — AB (ref 3.5–5.1)
Sodium: 154 mmol/L — ABNORMAL HIGH (ref 135–145)

## 2015-05-19 LAB — GLUCOSE, CAPILLARY
GLUCOSE-CAPILLARY: 88 mg/dL (ref 65–99)
Glucose-Capillary: 82 mg/dL (ref 65–99)
Glucose-Capillary: 89 mg/dL (ref 65–99)

## 2015-05-19 LAB — CBC
HEMATOCRIT: 25.3 % — AB (ref 39.0–52.0)
Hemoglobin: 8.1 g/dL — ABNORMAL LOW (ref 13.0–17.0)
MCH: 31.8 pg (ref 26.0–34.0)
MCHC: 32 g/dL (ref 30.0–36.0)
MCV: 99.2 fL (ref 78.0–100.0)
PLATELETS: 334 10*3/uL (ref 150–400)
RBC: 2.55 MIL/uL — AB (ref 4.22–5.81)
RDW: 15.8 % — AB (ref 11.5–15.5)
WBC: 9.4 10*3/uL (ref 4.0–10.5)

## 2015-05-19 MED ORDER — DEXTROSE 5 % IV SOLN
INTRAVENOUS | Status: DC
  Administered 2015-05-19 – 2015-05-22 (×4): via INTRAVENOUS

## 2015-05-19 MED ORDER — RESOURCE THICKENUP CLEAR PO POWD
ORAL | Status: DC | PRN
  Filled 2015-05-19: qty 125

## 2015-05-19 MED ORDER — SERTRALINE HCL 100 MG PO TABS
100.0000 mg | ORAL_TABLET | Freq: Every morning | ORAL | Status: DC
  Administered 2015-05-19 – 2015-05-24 (×6): 100 mg via ORAL
  Filled 2015-05-19 (×6): qty 1

## 2015-05-19 MED ORDER — POTASSIUM CHLORIDE 10 MEQ/50ML IV SOLN
10.0000 meq | INTRAVENOUS | Status: AC
  Administered 2015-05-19 (×3): 10 meq via INTRAVENOUS
  Filled 2015-05-19 (×3): qty 50

## 2015-05-19 MED ORDER — POTASSIUM CHLORIDE CRYS ER 20 MEQ PO TBCR
40.0000 meq | EXTENDED_RELEASE_TABLET | Freq: Once | ORAL | Status: AC
  Administered 2015-05-19: 40 meq via ORAL
  Filled 2015-05-19: qty 2

## 2015-05-19 MED ORDER — BUPROPION HCL ER (XL) 300 MG PO TB24
300.0000 mg | ORAL_TABLET | Freq: Every morning | ORAL | Status: DC
  Administered 2015-05-19 – 2015-05-24 (×6): 300 mg via ORAL
  Filled 2015-05-19 (×7): qty 1

## 2015-05-19 NOTE — Progress Notes (Signed)
PULMONARY / CRITICAL CARE MEDICINE   Name: Eddie Williams MRN: [id number] DOB: 07/16/1971    ADMISSION DATE:  05/07/2015  REFERRING MD :  [facility] ED  CHIEF COMPLAINT:  PNA, Acute respiratory distress  INITIAL PRESENTATION: 44 y/o man, hx polysubstance abuse, seizures, COPD / asthma, presented with B infiltrates and altered MS 9/17. Possible toxic ingestion of benzos and narcs. Progressed to respiratory failure and required intubation and MV, then hypotension with presumed septic shock.  Prolonged ICU course due to sedation / agitation needs.   STUDIES:  CXR 9/16 >> no infiltrates CXR 9/17 >> Bilateral infiltrates and consolidation, perihilar predominant but involving other fields as well CT Head 9/17 >> no acute intracranial abnormality. EEG 9/18 >> abnormal EEG with presence of generalized background slowing / encephalopathy.  NO seizures. ECHO 9/18 >> LVEF 55-60%, Grade 2 Diastolic dysfunction, LAE, RV mod dilated, systolic function mildly reduced, RA mod dilated, PA pressure 53 mm Hg  SIGNIFICANT EVENTS: 9/17  Intubation & Admission 9/17  Worsening mental status & questionable seizure activity 9/19  Self extubated x2, significant agitation  9/20  ETT above cords, pt able to make sounds on NP exam.  Tube adjusted and FOB at bedside for placement, BAL.   9/21  Severe vent dyssynchrony, started on pressure control ventilation 9/22  Sedate on 400 mcg fent, 8 versed, 48 propofol  9/23  heavy sedation with dilaudid, diprivan, versed  9/26  Good UOP, CVVHD stopped over the weekend, restarted on norepinephrine  9/27  ARDS protocol d/c'd, weaning, low grade fever, anemia s/p 1 unit PRBC 9/28  Extubated     SUBJECTIVE:   RN reports pt mildly confused, pleasant.  No acute events overnight.   VITAL SIGNS: Temp:  [97.9 F (36.6 C)-99.7 F (37.6 C)] 98.5 F (36.9 C) (09/29 0800) Pulse Rate:  [77-96] 92 (09/29 0900) Resp:  [4-33] 21 (09/29 0900) BP: (103-143)/(47-90) 143/80 mmHg (09/29  0900) SpO2:  [92 %-100 %] 96 % (09/29 0900) Weight:  [184 lb 4.9 oz (83.6 kg)] 184 lb 4.9 oz (83.6 kg) (09/29 0500)   HEMODYNAMICS:     VENTILATOR SETTINGS:     INTAKE / OUTPUT:  Intake/Output Summary (Last 24 hours) at 05/19/15 0932 Last data filed at 05/19/15 0600  Gross per 24 hour  Intake 441.54 ml  Output   2705 ml  Net -2263.46 ml    PHYSICAL EXAMINATION: Gen: young adult male in NAD HENT: PERRL. Moist mucus membranes, edentulous  PULM: Clear anteriorly, even/non-labored CV: s1s2 rrr, no m/r/g GI : Soft, + BS, ND Neuro: Awake, alert, speech clear, generalized weakness but MAE Skin:  Warm/dry, no edema   LABS:  CBC  Recent Labs Lab 05/17/15 0555 05/18/15 0450 05/19/15 0500  WBC 15.9* 9.8 9.4  HGB 6.8* 7.4* 8.1*  HCT 20.3* 23.0* 25.3*  PLT 150 211 334    Coag's  Recent Labs Lab 05/14/15 0430 05/15/15 0330 05/17/15 0550  APTT 33 33 29  INR 1.39  --  1.46    BMET  Recent Labs Lab 05/17/15 0555 05/18/15 0450 05/19/15 0500  NA 143 148* 154*  K 2.7* 2.9* 3.1*  CL 99* 101 107  CO2 30 36* 34*  BUN 66* 70* 62*  CREATININE 2.20* 1.84* 1.40*  GLUCOSE 103* 106* 99   Electrolytes  Recent Labs Lab 05/15/15 0330  05/16/15 0530 05/17/15 0555 05/18/15 0450 05/19/15 0500  CALCIUM 7.7*  7.6*  < > 8.1* 8.0* 8.3* 8.5*  MG 2.1  --   --  2.0 1.8  --   PHOS 2.7  2.6  < > 3.7 5.0*  --  4.1  < > = values in this interval not displayed.   Sepsis Markers  Recent Labs Lab 05/15/15 0330 05/16/15 0530 05/17/15 0555  PROCALCITON 0.86 0.70 0.49   ABG  Recent Labs Lab 05/15/15 0338 05/16/15 0350 05/17/15 0422  PHART 7.451* 7.472* 7.488*  PCO2ART 45.0 41.1 41.6  PO2ART 95.4 86.5 76.7*   Liver Enzymes  Recent Labs Lab 05/15/15 0330 05/15/15 1609 05/19/15 0500  ALBUMIN 1.9* 2.0* 2.7*   Cardiac Enzymes No results for input(s): TROPONINI, PROBNP in the last 168 hours.   Glucose  Recent Labs Lab 05/18/15 1147 05/18/15 1541  05/18/15 2038 05/19/15 0003 05/19/15 0331 05/19/15 0738  GLUCAP 103* 94 86 89 88 82    Imaging 9/29 - images personally reviewed, improving but persistant bilateral opacities.  ASSESSMENT / PLAN:  PULMONARY OETT 9/17 >> 9/19, 9/19 >>9/28 A: ARDS -  Difficult to manage, severe vent dyssynchrony.  Improved compliance 9/22.  Extubated 9/28.   Acute Hypercarbic Respiratory Failure - resolved HCAP vs aspiration PNA Self Extubation x2 - 9/19, bronch for placement but was in R mainstem.  CXR reviewed/measured 9/22, adjusted to 26 at teeth P:   Pulmonary hygiene:  IS, mobilize Intermittent CXR   CARDIOVASCULAR CVL L IJ CVC 9/17 >>  A:  Shock - Septic, worsening shock but no new source identified Pulmonary hypertension suggested on Echo - most likely due to acute vasoconstriction in setting of ARDS VT - short run noted overnight 9/22 Hx HTN P:  Tele / SDU monitoring  Will need f/u echo as outpatient Hold home medications  RENAL R IJ HD Cath 9/23 >>  A:   Acute Renal Failure - due to rhabdomyolysis & shock.  Required CVVHD.   Hypokalemia Hypernatremia Metabolic/Lactic Acidosis - trending q6hr Hypomagnesemia - Replacing IV. P:   Trend BMP / UOP Replace electrolytes as indicated Good urine output. Improving sr creatinine  Nephrology signed off IVF to D5 at 75 ml/hr Hopeful to establish PIV and d/c HD catheter   GASTROINTESTINAL A:   Dysphagia - s/p SLP evaluation 9/29 with clearance for D3 (mechnical soft), nectar thick liquid diet  Moderate Protein Calorie Malnutrition Constipation P:   SLP following, cleared or D3, nectar thick 9/29 Protonix IV q24hr, consider d/c  Laxative PRN  HEMATOLOGIC A:   Mild Anemia - No signs of active bleeding Thrombocytopenia - suspect in setting of sepsis but also must consider HIT with heparin.  Improved 9/27 P:  Daily CBC D/C pepcid with thrombocytopenia SCDs Heparin [place] q8hr, dc'd 9/23 due to thrombocytopenia.  Consider  restart 9/30 am and monitor platelets closely  INFECTIOUS A:   HCAP vs aspiration PNA (E-Coli)   Fever - resolved.  P:   BCx2 9/17 >> neg  UC 9/17 >> neg Resp 9/17 >> E-Coli >> sens to aztreonam/imipenem FOB BAL 9/20 >> few yeast  Aztreonam 9/17 >> 9/22 Vanco 9/17 >> 9/19  Vanco 9/22 >> 9/28 Meropenem 9/22 >> 9/28  Consider 7 days abx, D7/7 on 9/28.    ENDOCRINE: A:   Hyperglycemia - in setting of acute critical illness P:   SSI  Accuchecks q4hr  NEUROLOGIC A:   Accidental Overdose - benzo, narcotic, cocaine.  Patient denies suicidal intent. Acute encephalopathy - Toxic Metabolic + Overdose. Head CT negative Agitated Delirium / Difficult to achieve sedation - resolved Sedation for MV > needed high doses of fentanyl and versed  and propofol H/O Seizure disorder - EEG 9/19 without evidence of seizure EtOH abuse Critical Illness Deconditioning  P:   Seizure precautions Methadone  BID, consider slow wean to off Thiamine, folate Physical Therapy consult Resume home wellbutrin & zoloft 9/29    FAMILY  - Updates: No family at bedside 9/29    SDU status, PT consult.  Transfer to [facility] as of 9/30 am.  PCCM will sign off.    Canary Brim, NP-C [facility] Pgr: [phone] or if no answer [phone] 05/19/2015, 9:32 AM

## 2015-05-19 NOTE — Evaluation (Signed)
Clinical/Bedside Swallow Evaluation Patient Details  Name: Eddie Williams MRN: [id number] Date of Birth: 09-21-1970  Today's Date: 05/19/2015 Time: SLP Start Time (ACUTE ONLY): 4098 SLP Stop Time (ACUTE ONLY): 0857 SLP Time Calculation (min) (ACUTE ONLY): 22 min  Past Medical History:  Past Medical History  Diagnosis Date  . Anxiety   . Chronic back pain   . Seizures   . COPD (chronic obstructive pulmonary disease)   . Hypertension   . Kidney stones   . Asthma    Past Surgical History:  Past Surgical History  Procedure Laterality Date  . Back surgery    . Appendectomy    . Ankle surgery     HPI:  44 yr old o man, hx polysubstance abuse, tobacco abuse, anxiety, chronic back pain, seizures, COPD/asthma, presented with bilateral infiltrates and AMS 9/17. Found to have possible toxic ingestion of benzos and narcs. Progressed to respiratory failure, intubated 9/17 self extubated x 29/19, reintubated and extubated 9/27, hypotension presumed septic shock. CXR stable inflation after extubation. History of pneumonia with stable lung opacities.   Assessment / Plan / Recommendation Clinical Impression  Pt confused, decreased vocal intensity, quality not significantly hoarse. Immediate s/s with cough following thin liquids consistently apparently alleviated with nectar thick liquids. Dysphagia reversible and acute following 10 day intubation. Pt will need full supervision due to confusion; recommend Dys 3 diet (girlfriend to get powder for dentures) and nectar thick liquids, meds whole in applesauce and follow up ST.    Aspiration Risk  Severe    Diet Recommendation Dysphagia 3 (Mech soft);Nectar   Medication Administration: Whole meds with puree Compensations: Small sips/bites;Check for pocketing;Slow rate    Other  Recommendations Oral Care Recommendations: Oral care BID Other Recommendations: Order thickener from pharmacy   Follow Up Recommendations       Frequency and Duration  min 2x/week  2 weeks   Pertinent Vitals/Pain none    SLP Swallow Goals     Swallow Study Prior Functional Status       General Other Pertinent Information: 44 yr old o man, hx polysubstance abuse, tobacco abuse, anxiety, chronic back pain, seizures, COPD/asthma, presented with bilateral infiltrates and AMS 9/17. Found to have possible toxic ingestion of benzos and narcs. Progressed to respiratory failure, intubated 9/17 self extubated x 29/19, reintubated and extubated 9/27, hypotension presumed septic shock. CXR stable inflation after extubation. History of pneumonia with stable lung opacities. Type of Study: Bedside swallow evaluation Previous Swallow Assessment:  (none) Diet Prior to this Study: NPO Temperature Spikes Noted: Yes Respiratory Status: Supplemental O2 delivered via (comment) History of Recent Intubation: Yes Length of Intubations (days):  (10) Date extubated: 05/18/15 (self extubation x 2) Behavior/Cognition: Alert;Cooperative;Confused;Requires cueing Oral Cavity - Dentition:  (dentures) Self-Feeding Abilities: Able to feed self;Needs set up;Needs assist Patient Positioning: Upright in bed Baseline Vocal Quality: Low vocal intensity Volitional Cough: Strong Volitional Swallow: Able to elicit    Oral/Motor/Sensory Function Overall Oral Motor/Sensory Function: Appears within functional limits for tasks assessed   Ice Chips Ice chips: Not tested   Thin Liquid Thin Liquid: Impaired Presentation: Cup;Spoon Oral Phase Impairments: Reduced labial seal Pharyngeal  Phase Impairments: Suspected delayed Swallow;Cough - Immediate;Throat Clearing - Delayed    Nectar Thick Nectar Thick Liquid: Impaired Presentation: Cup Pharyngeal Phase Impairments: Suspected delayed Swallow   Honey Thick Honey Thick Liquid: Not tested   Puree Puree: Impaired Pharyngeal Phase Impairments: Suspected delayed Swallow   Solid   GO    Solid: Impaired Oral Phase  Impairments: Reduced  lingual movement/coordination       Royce Macadamia 05/19/2015,9:05 AM   Breck Coons Lonell Face.Ed [employer] [phone]

## 2015-05-19 NOTE — Plan of Care (Signed)
Problem: Phase II Progression Outcomes Goal: Date pt extubated/weaned off vent Outcome: Completed/Met Date Met:  05/19/15 05/18/15

## 2015-05-20 DIAGNOSIS — E440 Moderate protein-calorie malnutrition: Secondary | ICD-10-CM

## 2015-05-20 LAB — CBC
HCT: 27.2 % — ABNORMAL LOW (ref 39.0–52.0)
Hemoglobin: 8.5 g/dL — ABNORMAL LOW (ref 13.0–17.0)
MCH: 32.3 pg (ref 26.0–34.0)
MCHC: 31.3 g/dL (ref 30.0–36.0)
MCV: 103.4 fL — AB (ref 78.0–100.0)
PLATELETS: 401 10*3/uL — AB (ref 150–400)
RBC: 2.63 MIL/uL — ABNORMAL LOW (ref 4.22–5.81)
RDW: 15.9 % — AB (ref 11.5–15.5)
WBC: 9.7 10*3/uL (ref 4.0–10.5)

## 2015-05-20 LAB — COMPREHENSIVE METABOLIC PANEL
ALT: 35 U/L (ref 17–63)
AST: 40 U/L (ref 15–41)
Albumin: 2.8 g/dL — ABNORMAL LOW (ref 3.5–5.0)
Alkaline Phosphatase: 50 U/L (ref 38–126)
Anion gap: 8 (ref 5–15)
BUN: 44 mg/dL — AB (ref 6–20)
CHLORIDE: 107 mmol/L (ref 101–111)
CO2: 35 mmol/L — AB (ref 22–32)
CREATININE: 1.12 mg/dL (ref 0.61–1.24)
Calcium: 8.5 mg/dL — ABNORMAL LOW (ref 8.9–10.3)
GFR calc Af Amer: >60 mL/min (ref >60)
GFR calc non Af Amer: >60 mL/min (ref >60)
Glucose, Bld: 136 mg/dL — ABNORMAL HIGH (ref 65–99)
Potassium: 3 mmol/L — ABNORMAL LOW (ref 3.5–5.1)
SODIUM: 150 mmol/L — AB (ref 135–145)
Total Bilirubin: 0.3 mg/dL (ref 0.3–1.2)
Total Protein: 6.8 g/dL (ref 6.5–8.1)

## 2015-05-20 LAB — PHOSPHORUS: Phosphorus: 2.9 mg/dL (ref 2.5–4.6)

## 2015-05-20 LAB — MAGNESIUM: Magnesium: 1.8 mg/dL (ref 1.7–2.4)

## 2015-05-20 MED ORDER — BOOST / RESOURCE BREEZE PO LIQD
1.0000 | Freq: Three times a day (TID) | ORAL | Status: DC
  Administered 2015-05-20 – 2015-05-24 (×6): 1 via ORAL

## 2015-05-20 MED ORDER — POTASSIUM CHLORIDE 10 MEQ/100ML IV SOLN
10.0000 meq | INTRAVENOUS | Status: AC
  Administered 2015-05-20 (×3): 10 meq via INTRAVENOUS
  Filled 2015-05-20 (×3): qty 100

## 2015-05-20 MED ORDER — FOLIC ACID 1 MG PO TABS
1.0000 mg | ORAL_TABLET | Freq: Every day | ORAL | Status: DC
  Administered 2015-05-21 – 2015-05-24 (×4): 1 mg via ORAL
  Filled 2015-05-20 (×4): qty 1

## 2015-05-20 MED ORDER — PANTOPRAZOLE SODIUM 40 MG PO TBEC
40.0000 mg | DELAYED_RELEASE_TABLET | Freq: Every day | ORAL | Status: DC
  Administered 2015-05-20 – 2015-05-24 (×5): 40 mg via ORAL
  Filled 2015-05-20 (×7): qty 1

## 2015-05-20 MED ORDER — CHLORHEXIDINE GLUCONATE 0.12 % MT SOLN
OROMUCOSAL | Status: AC
  Administered 2015-05-20: 15 mL via OROMUCOSAL
  Filled 2015-05-20: qty 15

## 2015-05-20 MED ORDER — VITAMIN B-1 100 MG PO TABS
100.0000 mg | ORAL_TABLET | Freq: Every day | ORAL | Status: DC
  Administered 2015-05-21 – 2015-05-24 (×4): 100 mg via ORAL
  Filled 2015-05-20 (×4): qty 1

## 2015-05-20 MED ORDER — NICOTINE 21 MG/24HR TD PT24
21.0000 mg | MEDICATED_PATCH | Freq: Every day | TRANSDERMAL | Status: DC
  Administered 2015-05-20 – 2015-05-24 (×5): 21 mg via TRANSDERMAL
  Filled 2015-05-20 (×5): qty 1

## 2015-05-20 NOTE — Progress Notes (Signed)
Speech Language Pathology Treatment: Dysphagia  Patient Details Name: Eddie Williams MRN: [id number] DOB: [birth year] Today's Date: 05/20/2015 Time: 5284-1324 SLP Time Calculation (min) (ACUTE ONLY): 11 min  Assessment / Plan / Recommendation Clinical Impression  Pt has immediate coughing with large, consecutive sips both by cup and by straw. With Mod cues from SLP for smaller, single sips, no further coughing was noted. Per RN, pt will not drink the thickened liquids. Recommend to advance liquids to thin consistency but with FULL supervision for pacing.    HPI Other Pertinent Information: 44 yr old o man, hx polysubstance abuse, tobacco abuse, anxiety, chronic back pain, seizures, COPD/asthma, presented with bilateral infiltrates and AMS 9/17. Found to have possible toxic ingestion of benzos and narcs. Progressed to respiratory failure, intubated 9/17 self extubated x 29/19, reintubated and extubated 9/27, hypotension presumed septic shock. CXR stable inflation after extubation. History of pneumonia with stable lung opacities.   Pertinent Vitals Pain Assessment: Faces Faces Pain Scale: No hurt  SLP Plan  Continue with current plan of care    Recommendations Diet recommendations: Dysphagia 3 (mechanical soft);Thin liquid Liquids provided via: Cup;Straw Medication Administration: Whole meds with puree Supervision: Patient able to self feed;Full supervision/cueing for compensatory strategies Compensations: Small sips/bites;Check for pocketing;Slow rate Postural Changes and/or Swallow Maneuvers: Seated upright 90 degrees       Oral Care Recommendations: Oral care BID Follow up Recommendations: None Plan: Continue with current plan of care    Maxcine Ham, M.A. CCC-SLP [phone]  Maxcine Ham 05/20/2015, 10:45 AM

## 2015-05-20 NOTE — Progress Notes (Signed)
Key Points: Use following P&T approved IV to PO change policy.  Description contains the criteria that are approved Note: Policy Excludes:  Esophagectomy patientsPHARMACIST - PHYSICIAN COMMUNICATION DR:   Blake Divine CONCERNING: IV to Oral Route Change Policy  RECOMMENDATION: This patient is receiving protonix, thiamine, folic acid by the intravenous route.  Based on criteria approved by the Pharmacy and Therapeutics Committee, the intravenous medication(s) is/are being converted to the equivalent oral dose form(s).   DESCRIPTION: These criteria include:  The patient is eating (either orally or via tube) and/or has been taking other orally administered medications for a least 24 hours  The patient has no evidence of active gastrointestinal bleeding or impaired GI absorption (gastrectomy, short bowel, patient on TNA or NPO).  If you have questions about this conversion, please contact the Pharmacy Department    [phone] )  [facility]   [phone] )  [facility]   [phone] )  [facility]   [phone] )  [facility]   [phone] )  [facility]   Loralee Pacas, PharmD, BCPS Pager: [phone] 05/20/2015 10:43 AM

## 2015-05-20 NOTE — Progress Notes (Signed)
[id number] Davis,RN,BSN,CCM:  Chart reviewed will continued to follow for needs.

## 2015-05-20 NOTE — Progress Notes (Signed)
Nutrition Follow-up  DOCUMENTATION CODES:   Non-severe (moderate) malnutrition in context of chronic illness  INTERVENTION:  - Will order Boost Breeze TID, each supplement provides 250 kcal and 9 grams of protein - RD will continue to monitor for needs   NUTRITION DIAGNOSIS:   Inadequate oral intake related to inability to eat as evidenced by NPO status. -resolving with diet advancement  GOAL:   Patient will meet greater than or equal to 90% of their needs -likely not fully met at this time  MONITOR:   PO intake, Supplement acceptance, Weight trends, Labs, Skin, I & O's  ASSESSMENT:   44 yo man, hx polysubstance abuse, seizures, COPD / asthma, presented with B infiltrates and altered MS 9/17. Possible toxic ingestion of benzos and narcs. Progressed to respiratory failure and required intubation and MV, then hypotension presumed septic shock.    Pt was extubated 9/28 and diet advanced to Dysphagia 3, nectar-thick yesterday (9/29) at 0911. SLP saw pt again this AM and cleared him to have thin liquids rather than nectar-thick. Mother is at bedside and reports that pt has always had "a bad digestive tract" but pt and mother unable to further clarify.  Pt reports that he has had a good appetite. No intakes documented in the chart. Asked pt what he had for breakfast and he states: "eggs, bacon, baked potato, french fries, hashbrowns, pancakes." He states that he has been feeling nauseated and having abdominal pain but intakes have not exacerbated this.   Unsure if pt is fully meeting needs at this time. Will order Boost Breeze to supplement as pt may accept this supplement over Ensure Enlive. Medications reviewed. Labs reviewed; CBGs: 82-133 mg/dL, Na: 154 mmol/L, K: 3.1 mg/dL, BUN/creatinine elevated but trending down, Ca: 8.5 mg/dL.   Diet Order:  DIET DYS 3 Room service appropriate?: Yes; Fluid consistency:: Thin  Skin:  Wound (see comment) (R knee and R elbow wounds)  Last BM:   9/30  Height:   Ht Readings from Last 1 Encounters:  05/07/15 _0  (1.753 m)    Weight:   Wt Readings from Last 1 Encounters:  05/20/15 154 lb 12.2 oz (70.2 kg)    Ideal Body Weight:  72.7 kg  BMI:  Body mass index is 22.84 kg/(m^2).  Estimated Nutritional Needs:   Kcal:  1800-2000  Protein:  90-100g  Fluid:  2L/day  EDUCATION NEEDS:   No education needs identified at this time     Jarome Matin, RD, LDN Inpatient Clinical Dietitian Pager # [phone] After hours/weekend pager # [phone]

## 2015-05-20 NOTE — Progress Notes (Signed)
[facility] PROGRESS NOTE  Eddie Williams MWU:[id number] DOB: January 20, 1971 DOA: 05/07/2015 PCP: [name], MD  HPI:  44 y/o man, hx polysubstance abuse, seizures, COPD / asthma, presented with B infiltrates and altered MS 9/17. Possible toxic ingestion of benzos and narcs. Progressed to respiratory failure and required intubation and MV, then hypotension with presumed septic shock. \  SIGNIFICANT EVENTS: 9/17 Intubation & Admission 9/17 Worsening mental status & questionable seizure activity 9/19 Self extubated x2, significant agitation  9/20 ETT above cords, pt able to make sounds on NP exam. Tube adjusted and FOB at bedside for placement, BAL.  9/21 Severe vent dyssynchrony, started on pressure control ventilation 9/22 Sedate on 400 mcg fent, 8 versed, 48 propofol  9/23 heavy sedation with dilaudid, diprivan, versed  9/26 Good UOP, CVVHD stopped over the weekend, restarted on norepinephrine  9/27 ARDS protocol d/c'd, weaning, low grade fever, anemia s/p 1 unit PRBC 9/28 Extubated   ASSESSMENT AND PLAN:   ARDS:with acute hypercarbic respiratory failure/ HCAP/ aspiration pneumonia.  - currently on 2 lit of [place] oxygen.  Plan to wean him off oxygen soon.  Currently blood cultures are negative, respiratory cultures grew E COLI completed the course of antibiotics.   Septic shock: Resolved.    Acute renal failure due to rhabdomyolysis : Improved.    Hypokalemia: replete as needed adn repeat in am.    Overdose with benzo's, narcotics and cocaine: no suicidal ideation.  improved   Hypernatremia/ metabolic acidosis: Improving, currently on dextrose fluids.  Nephrology signed off.   Dysphagia: SLP eval ON dysphagia 3 diet.   Plan: d/c HD catheter and insert PICC line.  Initiate PT eval.    Tobacco abuse: Nicotine patch ordered.     Code Status: full code.  Family Communication: none at bedside.  Disposition Plan: transfer to telemetry.     Consultants:  CRITICAL CARE.  Renal  SLP  Physical therapy  Procedures: CXR 9/16 >> no infiltrates CXR 9/17 >> Bilateral infiltrates and consolidation, perihilar predominant but involving other fields as well CT Head 9/17 >> no acute intracranial abnormality. EEG 9/18 >> abnormal EEG with presence of generalized background slowing / encephalopathy. NO seizures. ECHO 9/18 >> LVEF 55-60%, Grade 2 Diastolic dysfunction, LAE, RV mod dilated, systolic function mildly reduced, RA mod dilated, PA pressure 53 mm Hg Antibiotics:  Completed aztreonam.  HPI/Subjective: Requesting for a cigarette and clonazepam.  Objective: Filed Vitals:   05/20/15 1721  BP: 158/89  Pulse:   Temp:   Resp: 26    Intake/Output Summary (Last 24 hours) at 05/20/15 1957 Last data filed at 05/20/15 1900  Gross per 24 hour  Intake   3135 ml  Output   1005 ml  Net   2130 ml   Filed Weights   05/18/15 0400 05/19/15 0500 05/20/15 0500  Weight: 72.9 kg (160 lb 11.5 oz) 83.6 kg (184 lb 4.9 oz) 70.2 kg (154 lb 12.2 oz)    Exam:   General:  Alert , shaky in the chair comfortable.   Cardiovascular: s1s2  Respiratory: diminished at bases.   Abdomen: soft NT nd bs+  Musculoskeletal: trace pedal edema.   Data Reviewed: Basic Metabolic Panel:  Recent Labs Lab 05/14/15 0430  05/15/15 0330  05/15/15 1609 05/16/15 0530 05/17/15 0555 05/18/15 0450 05/19/15 0500 05/20/15 1240  NA 138  < > 141  142  < > 141 143 143 148* 154* 150*  K 4.6  < > 4.0  3.8  < > 3.7 3.5  2.7* 2.9* 3.1* 3.0*  CL 104  < > 98*  98*  < > 98* 100* 99* 101 107 107  CO2 26  < > 32  32  --  33* 33* 30 36* 34* 35*  GLUCOSE 96  < > 133*  133*  < > 122* 113* 103* 106* 99 136*  BUN 54*  < > 39*  41*  < > 33* 53* 66* 70* 62* 44*  CREATININE 3.06*  < > 1.84*  1.92*  < > 1.52* 2.33* 2.20* 1.84* 1.40* 1.12  CALCIUM 7.3*  < > 7.7*  7.6*  --  8.4* 8.1* 8.0* 8.3* 8.5* 8.5*  MG 2.1  --  2.1  --   --   --  2.0 1.8  --  1.8   PHOS 4.3  < > 2.7  2.6  --  2.2* 3.7 5.0*  --  4.1 2.9  < > = values in this interval not displayed. Liver Function Tests:  Recent Labs Lab 05/14/15 1555 05/15/15 0330 05/15/15 1609 05/19/15 0500 05/20/15 1240  AST  --   --   --   --  40  ALT  --   --   --   --  35  ALKPHOS  --   --   --   --  50  BILITOT  --   --   --   --  0.3  PROT  --   --   --   --  6.8  ALBUMIN 1.7* 1.9* 2.0* 2.7* 2.8*   No results for input(s): LIPASE, AMYLASE in the last 168 hours. No results for input(s): AMMONIA in the last 168 hours. CBC:  Recent Labs Lab 05/16/15 0530 05/17/15 0555 05/18/15 0450 05/19/15 0500 05/20/15 1240  WBC 19.2* 15.9* 9.8 9.4 9.7  HGB 7.2* 6.8* 7.4* 8.1* 8.5*  HCT 21.6* 20.3* 23.0* 25.3* 27.2*  MCV 98.2 98.5 97.9 99.2 103.4*  PLT 84* 150 211 334 401*   Cardiac Enzymes: No results for input(s): CKTOTAL, CKMB, CKMBINDEX, TROPONINI in the last 168 hours. BNP (last 3 results) No results for input(s): BNP in the last 8760 hours.  ProBNP (last 3 results) No results for input(s): PROBNP in the last 8760 hours.  CBG:  Recent Labs Lab 05/18/15 1541 05/18/15 2038 05/19/15 0003 05/19/15 0331 05/19/15 0738  GLUCAP 94 86 89 88 82    No results found for this or any previous visit (from the past 240 hour(s)).   Studies: Dg Chest Port 1 View  05/19/2015   CLINICAL DATA:  ARDS.  Pneumonia and sepsis.  EXAM: PORTABLE CHEST 1 VIEW  COMPARISON:  Yesterday  FINDINGS: Interval tracheal and esophageal extubation. Lung volumes remain stable. Unchanged bilateral lung opacity with mild apical predominance. Stable positioning of right IJ central line, tip near the SVC confluence. Normal heart size. No air leak.  IMPRESSION: Stable inflation after extubation. History of pneumonia with stable lung opacities.   Electronically Signed   By: Marnee Spring M.D.   On: 05/19/2015 05:37    Scheduled Meds: . antiseptic oral rinse  7 mL Mouth Rinse QID  . buPROPion  300 mg Oral q  morning - 10a  . chlorhexidine gluconate  15 mL Mouth Rinse BID  . feeding supplement  1 Container Oral TID BM  . [START ON 05/21/2015] folic acid  1 mg Oral Daily  . methadone  5 mg Oral Q12H  . nicotine  21 mg Transdermal Daily  . pantoprazole  40 mg  Oral Daily  . senna  1 tablet Oral Daily  . sertraline  100 mg Oral q morning - 10a  . [START ON 05/21/2015] thiamine  100 mg Oral Daily   Continuous Infusions: . dextrose 75 mL/hr at 05/20/15 1836    Active Problems:   HCAP (healthcare-associated pneumonia)   Septic shock   Altered mental status   Malnutrition of moderate degree    Time spent: 45 minutes.     [facility]  [facility] Pager [phone]. If 7PM-7AM, please contact night-coverage at [email or web address], password [facility] 05/20/2015, 7:57 PM  LOS: 13 days

## 2015-05-21 LAB — BASIC METABOLIC PANEL
Anion gap: 6 (ref 5–15)
BUN: 27 mg/dL — AB (ref 6–20)
CHLORIDE: 105 mmol/L (ref 101–111)
CO2: 33 mmol/L — ABNORMAL HIGH (ref 22–32)
Calcium: 7.9 mg/dL — ABNORMAL LOW (ref 8.9–10.3)
Creatinine, Ser: 0.86 mg/dL (ref 0.61–1.24)
Glucose, Bld: 98 mg/dL (ref 65–99)
POTASSIUM: 3 mmol/L — AB (ref 3.5–5.1)
SODIUM: 144 mmol/L (ref 135–145)

## 2015-05-21 LAB — MAGNESIUM: MAGNESIUM: 1.5 mg/dL — AB (ref 1.7–2.4)

## 2015-05-21 MED ORDER — SODIUM CHLORIDE 0.9 % IJ SOLN
10.0000 mL | INTRAMUSCULAR | Status: DC | PRN
Start: 2015-05-21 — End: 2015-05-24
  Administered 2015-05-23: 20 mL
  Filled 2015-05-21: qty 40

## 2015-05-21 MED ORDER — MAGNESIUM SULFATE 2 GM/50ML IV SOLN
2.0000 g | Freq: Once | INTRAVENOUS | Status: AC
  Administered 2015-05-21: 2 g via INTRAVENOUS
  Filled 2015-05-21: qty 50

## 2015-05-21 MED ORDER — POTASSIUM CHLORIDE 10 MEQ/100ML IV SOLN
10.0000 meq | INTRAVENOUS | Status: AC
  Administered 2015-05-21 (×3): 10 meq via INTRAVENOUS
  Filled 2015-05-21 (×3): qty 100

## 2015-05-21 NOTE — Progress Notes (Addendum)
[facility] PROGRESS NOTE  Eddie Williams:[id number] DOB: June 25, 1971 DOA: 05/07/2015 PCP: [name], MD  HPI:  44 y/o man, hx polysubstance abuse, seizures, COPD / asthma, presented with B infiltrates and altered MS 9/17. Possible toxic ingestion of benzos and narcs. Progressed to respiratory failure and required intubation and MV, then hypotension with presumed septic shock.   SIGNIFICANT EVENTS: 9/17 Intubation & Admission 9/17 Worsening mental status & questionable seizure activity 9/19 Self extubated x2, significant agitation  9/20 ETT above cords, pt able to make sounds on NP exam. Tube adjusted and FOB at bedside for placement, BAL.  9/21 Severe vent dyssynchrony, started on pressure control ventilation 9/22 Sedate on 400 mcg fent, 8 versed, 48 propofol  9/23 heavy sedation with dilaudid, diprivan, versed  9/26 Good UOP, CVVHD stopped over the weekend, restarted on norepinephrine  9/27 ARDS protocol d/c'd, weaning, low grade fever, anemia s/p 1 unit PRBC 9/28 Extubated   ASSESSMENT AND PLAN:   ARDS:with acute hypercarbic respiratory failure/ HCAP/ aspiration pneumonia.  - currently on 2 lit of [place] oxygen.  Plan to wean him off oxygen soon.  Currently blood cultures are negative, respiratory cultures grew E COLI completed the course of antibiotics.   Septic shock: Resolved.    Acute renal failure due to rhabdomyolysis : Improved.    Hypokalemia: replete as needed and repeat in am.   Hypomagnesemia: replete as needed.    Overdose with benzo's, narcotics and cocaine: no suicidal ideation.  Improved.   Hypernatremia/ metabolic acidosis: Improving, currently on dextrose fluids.  Nephrology signed off.   Dysphagia: SLP eval ON dysphagia 3 diet.   Plan: d/c HD catheter and insert PICC line.  Initiate PT eval.    Tobacco abuse: Nicotine patch ordered.     Code Status: full code.  Family Communication: none at bedside.   Disposition Plan: transfer to telemetry.    Consultants:  CRITICAL CARE.  Renal  SLP  Physical therapy  Procedures: CXR 9/16 >> no infiltrates CXR 9/17 >> Bilateral infiltrates and consolidation, perihilar predominant but involving other fields as well CT Head 9/17 >> no acute intracranial abnormality. EEG 9/18 >> abnormal EEG with presence of generalized background slowing / encephalopathy. NO seizures. ECHO 9/18 >> LVEF 55-60%, Grade 2 Diastolic dysfunction, LAE, RV mod dilated, systolic function mildly reduced, RA mod dilated, PA pressure 53 mm Hg Antibiotics:  Completed aztreonam.  HPI/Subjective: Wants to know when he is going to get the picc line.  Told him he doesn't need a PICC line anymore, and we should be able to stop the fluids.   Objective: Filed Vitals:   05/21/15 1207  BP:   Pulse:   Temp: 98.2 F (36.8 C)  Resp:     Intake/Output Summary (Last 24 hours) at 05/21/15 1541 Last data filed at 05/21/15 1208  Gross per 24 hour  Intake   1925 ml  Output   1000 ml  Net    925 ml   Filed Weights   05/18/15 0400 05/19/15 0500 05/20/15 0500  Weight: 72.9 kg (160 lb 11.5 oz) 83.6 kg (184 lb 4.9 oz) 70.2 kg (154 lb 12.2 oz)    Exam:   General:  Alert ,comfortable, denies any new complaints.   Cardiovascular: s1s2  Respiratory: diminished at bases.   Abdomen: soft NT nd bs+  Musculoskeletal: trace pedal edema.   Data Reviewed: Basic Metabolic Panel:  Recent Labs Lab 05/15/15 0330  05/15/15 1609 05/16/15 0530 05/17/15 0555 05/18/15 0450 05/19/15 0500 05/20/15 1240  05/21/15 0540 05/21/15 0840  NA 141  142  < > 141 143 143 148* 154* 150*  --  144  K 4.0  3.8  < > 3.7 3.5 2.7* 2.9* 3.1* 3.0*  --  3.0*  CL 98*  98*  < > 98* 100* 99* 101 107 107  --  105  CO2 32  32  --  33* 33* 30 36* 34* 35*  --  33*  GLUCOSE 133*  133*  < > 122* 113* 103* 106* 99 136*  --  98  BUN 39*  41*  < > 33* 53* 66* 70* 62* 44*  --  27*  CREATININE 1.84*   1.92*  < > 1.52* 2.33* 2.20* 1.84* 1.40* 1.12  --  0.86  CALCIUM 7.7*  7.6*  --  8.4* 8.1* 8.0* 8.3* 8.5* 8.5*  --  7.9*  MG 2.1  --   --   --  2.0 1.8  --  1.8 1.5*  --   PHOS 2.7  2.6  --  2.2* 3.7 5.0*  --  4.1 2.9  --   --   < > = values in this interval not displayed. Liver Function Tests:  Recent Labs Lab 05/14/15 1555 05/15/15 0330 05/15/15 1609 05/19/15 0500 05/20/15 1240  AST  --   --   --   --  40  ALT  --   --   --   --  35  ALKPHOS  --   --   --   --  50  BILITOT  --   --   --   --  0.3  PROT  --   --   --   --  6.8  ALBUMIN 1.7* 1.9* 2.0* 2.7* 2.8*   No results for input(s): LIPASE, AMYLASE in the last 168 hours. No results for input(s): AMMONIA in the last 168 hours. CBC:  Recent Labs Lab 05/16/15 0530 05/17/15 0555 05/18/15 0450 05/19/15 0500 05/20/15 1240  WBC 19.2* 15.9* 9.8 9.4 9.7  HGB 7.2* 6.8* 7.4* 8.1* 8.5*  HCT 21.6* 20.3* 23.0* 25.3* 27.2*  MCV 98.2 98.5 97.9 99.2 103.4*  PLT 84* 150 211 334 401*   Cardiac Enzymes: No results for input(s): CKTOTAL, CKMB, CKMBINDEX, TROPONINI in the last 168 hours. BNP (last 3 results) No results for input(s): BNP in the last 8760 hours.  ProBNP (last 3 results) No results for input(s): PROBNP in the last 8760 hours.  CBG:  Recent Labs Lab 05/18/15 1541 05/18/15 2038 05/19/15 0003 05/19/15 0331 05/19/15 0738  GLUCAP 94 86 89 88 82    No results found for this or any previous visit (from the past 240 hour(s)).   Studies: No results found.  Scheduled Meds: . antiseptic oral rinse  7 mL Mouth Rinse QID  . buPROPion  300 mg Oral q morning - 10a  . chlorhexidine gluconate  15 mL Mouth Rinse BID  . feeding supplement  1 Container Oral TID BM  . folic acid  1 mg Oral Daily  . methadone  5 mg Oral Q12H  . nicotine  21 mg Transdermal Daily  . pantoprazole  40 mg Oral Daily  . potassium chloride  10 mEq Intravenous Q1 Hr x 3  . senna  1 tablet Oral Daily  . sertraline  100 mg Oral q morning -  10a  . thiamine  100 mg Oral Daily   Continuous Infusions: . dextrose 75 mL/hr at 05/21/15 1100    Active Problems:  HCAP (healthcare-associated pneumonia)   Septic shock   Altered mental status   Malnutrition of moderate degree    Time spent: 25 minutes.     [facility]  [facility] Pager [phone]. If 7PM-7AM, please contact night-coverage at [email or web address], password [facility] 05/21/2015, 3:41 PM  LOS: 14 days

## 2015-05-21 NOTE — Progress Notes (Signed)
Spoke with Dr. Blake Divine regarding PICC placement.  Pt indicates slight hesitation for PICC.  Dr. Blake Divine states okay to leave HD cath in place and will reevaluate if PICC necessary later.  To d/c PICC order and continue use of HD cath.

## 2015-05-22 ENCOUNTER — Inpatient Hospital Stay (HOSPITAL_COMMUNITY): Payer: Medicare Other

## 2015-05-22 LAB — BASIC METABOLIC PANEL
Anion gap: 8 (ref 5–15)
BUN: 12 mg/dL (ref 6–20)
CHLORIDE: 102 mmol/L (ref 101–111)
CO2: 27 mmol/L (ref 22–32)
CREATININE: 0.81 mg/dL (ref 0.61–1.24)
Calcium: 8.1 mg/dL — ABNORMAL LOW (ref 8.9–10.3)
GFR calc non Af Amer: >60 mL/min (ref >60)
Glucose, Bld: 91 mg/dL (ref 65–99)
POTASSIUM: 3.5 mmol/L (ref 3.5–5.1)
SODIUM: 137 mmol/L (ref 135–145)

## 2015-05-22 LAB — MAGNESIUM: MAGNESIUM: 1.5 mg/dL — AB (ref 1.7–2.4)

## 2015-05-22 MED ORDER — MAGNESIUM SULFATE 50 % IJ SOLN
3.0000 g | Freq: Once | INTRAMUSCULAR | Status: AC
  Administered 2015-05-22: 3 g via INTRAVENOUS
  Filled 2015-05-22: qty 6

## 2015-05-22 MED ORDER — TRAMADOL HCL 50 MG PO TABS
100.0000 mg | ORAL_TABLET | Freq: Four times a day (QID) | ORAL | Status: DC | PRN
  Administered 2015-05-22 – 2015-05-24 (×6): 100 mg via ORAL
  Filled 2015-05-22 (×7): qty 2

## 2015-05-22 MED ORDER — POTASSIUM CHLORIDE CRYS ER 20 MEQ PO TBCR
40.0000 meq | EXTENDED_RELEASE_TABLET | Freq: Once | ORAL | Status: AC
  Administered 2015-05-22: 40 meq via ORAL
  Filled 2015-05-22: qty 2

## 2015-05-22 NOTE — Progress Notes (Signed)
[facility] PROGRESS NOTE  Eddie Williams ZOX:[id number] DOB: 1971-07-23 DOA: 05/07/2015 PCP: [name], MD  HPI:  44 y/o man, hx polysubstance abuse, seizures, COPD / asthma, presented with B infiltrates and altered MS 9/17. Possible toxic ingestion of benzos and narcs. Progressed to respiratory failure and required intubation and MV, then hypotension with presumed septic shock.   SIGNIFICANT EVENTS: 9/17 Intubation & Admission 9/17 Worsening mental status & questionable seizure activity 9/19 Self extubated x2, significant agitation  9/20 ETT above cords, pt able to make sounds on NP exam. Tube adjusted and FOB at bedside for placement, BAL.  9/21 Severe vent dyssynchrony, started on pressure control ventilation 9/22 Sedate on 400 mcg fent, 8 versed, 48 propofol  9/23 heavy sedation with dilaudid, diprivan, versed  9/26 Good UOP, CVVHD stopped over the weekend, restarted on norepinephrine  9/27 ARDS protocol d/c'd, weaning, low grade fever, anemia s/p 1 unit PRBC 9/28 Extubated   ASSESSMENT AND PLAN:   ARDS:with acute hypercarbic respiratory failure/ HCAP/ aspiration pneumonia.  On RA with good oxygen sats.  Currently blood cultures are negative, respiratory cultures grew E COLI completed the course of antibiotics.   Septic shock: Resolved.    Acute renal failure due to rhabdomyolysis : Improved.    Hypokalemia: replete as needed and repeat in am.   Hypomagnesemia: replete as needed.    Overdose with benzo's, narcotics and cocaine: no suicidal ideation.  Improved.   Hypernatremia/ metabolic acidosis: Improving, currently on dextrose fluids.  Nephrology signed off.   Dysphagia: SLP eval ON dysphagia 3 diet.    Initiate PT eval.    Tobacco abuse: Nicotine patch ordered.   Right 2 nd toe discolaration: X rays are negative for fracture.  Wound care consulted.     Code Status: full code.  Family Communication: none at bedside.   Disposition Plan: transfer to telemetry.    Consultants:  CRITICAL CARE.  Renal  SLP  Physical therapy  Procedures: CXR 9/16 >> no infiltrates CXR 9/17 >> Bilateral infiltrates and consolidation, perihilar predominant but involving other fields as well CT Head 9/17 >> no acute intracranial abnormality. EEG 9/18 >> abnormal EEG with presence of generalized background slowing / encephalopathy. NO seizures. ECHO 9/18 >> LVEF 55-60%, Grade 2 Diastolic dysfunction, LAE, RV mod dilated, systolic function mildly reduced, RA mod dilated, PA pressure 53 mm Hg Antibiotics:  Completed aztreonam.  HPI/Subjective:  waiting for PT evaluation.  Requesting for stronger pain medication.  Objective: Filed Vitals:   05/22/15 1340  BP: 145/91  Pulse: 70  Temp: 98.6 F (37 C)  Resp: 20    Intake/Output Summary (Last 24 hours) at 05/22/15 1859 Last data filed at 05/22/15 1700  Gross per 24 hour  Intake   1950 ml  Output   1650 ml  Net    300 ml   Filed Weights   05/19/15 0500 05/20/15 0500 05/22/15 0420  Weight: 83.6 kg (184 lb 4.9 oz) 70.2 kg (154 lb 12.2 oz) 73.7 kg (162 lb 7.7 oz)    Exam:   General:  Alert ,comfortable, denies any new complaints.   Cardiovascular: s1s2  Respiratory: diminished at bases.   Abdomen: soft NT nd bs+  Musculoskeletal: trace pedal edema.   Data Reviewed: Basic Metabolic Panel:  Recent Labs Lab 05/16/15 0530 05/17/15 0555 05/18/15 0450 05/19/15 0500 05/20/15 1240 05/21/15 0540 05/21/15 0840 05/22/15 1355  NA 143 143 148* 154* 150*  --  144 137  K 3.5 2.7* 2.9* 3.1* 3.0*  --  3.0* 3.5  CL 100* 99* 101 107 107  --  105 102  CO2 33* 30 36* 34* 35*  --  33* 27  GLUCOSE 113* 103* 106* 99 136*  --  98 91  BUN 53* 66* 70* 62* 44*  --  27* 12  CREATININE 2.33* 2.20* 1.84* 1.40* 1.12  --  0.86 0.81  CALCIUM 8.1* 8.0* 8.3* 8.5* 8.5*  --  7.9* 8.1*  MG  --  2.0 1.8  --  1.8 1.5*  --  1.5*  PHOS 3.7 5.0*  --  4.1 2.9  --   --   --     Liver Function Tests:  Recent Labs Lab 05/19/15 0500 05/20/15 1240  AST  --  40  ALT  --  35  ALKPHOS  --  50  BILITOT  --  0.3  PROT  --  6.8  ALBUMIN 2.7* 2.8*   No results for input(s): LIPASE, AMYLASE in the last 168 hours. No results for input(s): AMMONIA in the last 168 hours. CBC:  Recent Labs Lab 05/16/15 0530 05/17/15 0555 05/18/15 0450 05/19/15 0500 05/20/15 1240  WBC 19.2* 15.9* 9.8 9.4 9.7  HGB 7.2* 6.8* 7.4* 8.1* 8.5*  HCT 21.6* 20.3* 23.0* 25.3* 27.2*  MCV 98.2 98.5 97.9 99.2 103.4*  PLT 84* 150 211 334 401*   Cardiac Enzymes: No results for input(s): CKTOTAL, CKMB, CKMBINDEX, TROPONINI in the last 168 hours. BNP (last 3 results) No results for input(s): BNP in the last 8760 hours.  ProBNP (last 3 results) No results for input(s): PROBNP in the last 8760 hours.  CBG:  Recent Labs Lab 05/18/15 1541 05/18/15 2038 05/19/15 0003 05/19/15 0331 05/19/15 0738  GLUCAP 94 86 89 88 82    No results found for this or any previous visit (from the past 240 hour(s)).   Studies: Dg Foot Complete Right  Jun 19, 2015   CLINICAL DATA:  Pain 2nd digit right foot, which has a black spot at distal portion of digit,lateral to the toenail-pt tripped and fell on 05/01/2015  EXAM: RIGHT FOOT COMPLETE - 3+ VIEW  COMPARISON:  None.  FINDINGS: There is no evidence of fracture or dislocation. There is no evidence of arthropathy or other focal bone abnormality. Soft tissues are unremarkable.  IMPRESSION: Negative.   Electronically Signed   By: Esperanza Heir M.D.   On: Jun 19, 2015 15:44    Scheduled Meds: . antiseptic oral rinse  7 mL Mouth Rinse QID  . buPROPion  300 mg Oral q morning - 10a  . chlorhexidine gluconate  15 mL Mouth Rinse BID  . feeding supplement  1 Container Oral TID BM  . folic acid  1 mg Oral Daily  . methadone  5 mg Oral Q12H  . nicotine  21 mg Transdermal Daily  . pantoprazole  40 mg Oral Daily  . senna  1 tablet Oral Daily  . sertraline  100  mg Oral q morning - 10a  . thiamine  100 mg Oral Daily   Continuous Infusions:    Active Problems:   HCAP (healthcare-associated pneumonia)   Septic shock (HCC)   Altered mental status   Malnutrition of moderate degree (HCC)    Time spent: 25 minutes.     [facility]  [facility] Pager [phone]. If 7PM-7AM, please contact night-coverage at [email or web address], password [facility] 06-19-2015, 6:59 PM  LOS: 15 days

## 2015-05-22 NOTE — Progress Notes (Signed)
Blackened area of discoloration noted to tip of right 2nd toe. Area measures 1cm x 1 cm. Erythema noted at base of nail measuring 1 cm x 0.5 cm. Wound nurse on unit Celesta Aver, RN) and Teacher, music with assessment. Recommendation included cleansing daily with Betadine swab.

## 2015-05-23 LAB — BASIC METABOLIC PANEL
ANION GAP: 9 (ref 5–15)
BUN: 10 mg/dL (ref 6–20)
CALCIUM: 8.4 mg/dL — AB (ref 8.9–10.3)
CHLORIDE: 100 mmol/L — AB (ref 101–111)
CO2: 27 mmol/L (ref 22–32)
Creatinine, Ser: 0.84 mg/dL (ref 0.61–1.24)
GFR calc non Af Amer: >60 mL/min (ref >60)
GLUCOSE: 98 mg/dL (ref 65–99)
Potassium: 3 mmol/L — ABNORMAL LOW (ref 3.5–5.1)
Sodium: 136 mmol/L (ref 135–145)

## 2015-05-23 LAB — MAGNESIUM: Magnesium: 2.2 mg/dL (ref 1.7–2.4)

## 2015-05-23 MED ORDER — PROMETHAZINE HCL 25 MG/ML IJ SOLN
12.5000 mg | Freq: Four times a day (QID) | INTRAMUSCULAR | Status: DC | PRN
  Administered 2015-05-23 – 2015-05-24 (×4): 12.5 mg via INTRAVENOUS
  Filled 2015-05-23 (×4): qty 1

## 2015-05-23 MED ORDER — POTASSIUM CHLORIDE CRYS ER 20 MEQ PO TBCR
40.0000 meq | EXTENDED_RELEASE_TABLET | Freq: Two times a day (BID) | ORAL | Status: AC
  Administered 2015-05-23 (×2): 40 meq via ORAL
  Filled 2015-05-23 (×2): qty 2

## 2015-05-23 NOTE — Progress Notes (Signed)
K+ 3 reported to hospitalist provider.

## 2015-05-23 NOTE — Consult Note (Signed)
WOC wound consult note Reason for Consult: Discolored area on the distal tip of right foot, second toe.  This presents as black discoloration with no elevation (eschar), and no purple or maroon pigment (consistent with deep tissue pressure injury from perhaps an improperly fitting shoe).  Xray is negative for fracture and patient denies injury. He does say that a "chicken beak pecked at it". There is no obvious sign of trauma and pedal pulses are strong, leading me to discard arterial insufficieny as a differential diagnosis. Wound type: Unknown etiology Pressure Ulcer POA: No Measurement: 1cm x 0.8xcm with no depth Wound ZOX:WRUE Drainage (amount, consistency, odor) None Periwound:intactng procedure/placement/frequency:I will provide Nursing with guidance to paint the affected area with a betadine swabstick twice daily and allow to air-dry to promote both an antimicrobial environment conducive to wound healing and an astringent to dry the tissue. In the absence of drainage, erythema, induration or warmth, there is no other indication for topical therapy at this time. WOC nursing team will not follow, but will remain available to this patient, the nursing and medical teams. Please re-consult if needed. Thanks, Ladona Mow, MSN, RN, GNP, [place], CWON-AP [phone])

## 2015-05-23 NOTE — Progress Notes (Signed)
Speech Language Pathology Treatment: Dysphagia  Patient Details Name: Eddie Williams MRN: [id number] DOB: 07/16/1971 Today's Date: 05/23/2015 Time: 1138-1146 SLP Time Calculation (min) (ACUTE ONLY): 8 min  Assessment / Plan / Recommendation Clinical Impression  Sitting up in chair, lucid (was confused 9/29 during initial eval with this SLPt). Able to recall small sips, chew thoroughly and implemented with modified independence. No indications of aspiration. Upgraded texture to regular and continue thin liquids. Pt prefers cups over straw. No follow up ST needed.     HPI Other Pertinent Information: 44 yr old o man, hx polysubstance abuse, tobacco abuse, anxiety, chronic back pain, seizures, COPD/asthma, presented with bilateral infiltrates and AMS 9/17. Found to have possible toxic ingestion of benzos and narcs. Progressed to respiratory failure, intubated 9/17 self extubated x 29/19, reintubated and extubated 9/27, hypotension presumed septic shock. CXR stable inflation after extubation. History of pneumonia with stable lung opacities.   Pertinent Vitals Pain Assessment: No/denies pain  SLP Plan  All goals met;Discharge SLP treatment due to (comment)    Recommendations Diet recommendations: Regular;Thin liquid Liquids provided via: Cup Medication Administration: Whole meds with liquid Supervision: Patient able to self feed;Intermittent supervision to cue for compensatory strategies Compensations: Slow rate;Small sips/bites Postural Changes and/or Swallow Maneuvers: Seated upright 90 degrees              Oral Care Recommendations: Oral care BID Follow up Recommendations: None Plan: All goals met;Discharge SLP treatment due to (comment)    GO     ,  Willis 05/23/2015, 11:44 AM   Willis  M.Ed CCC-SLP Pager [phone]     

## 2015-05-23 NOTE — Evaluation (Signed)
Physical Therapy Evaluation Patient Details Name: Eddie Williams MRN: [id number] DOB: 12-22-1970 Today's Date: 05/23/2015   History of Present Illness  44 yo man, hx polysubstance abuse, seizures, COPD / asthma, presented with B infiltrates and altered MS 9/17. Possible toxic ingestion of benzos and narcs. Progressed to respiratory failure and required intubation and MV, then hypotension presumed septic shock., required intubation-->self extubated on 9/28;  Clinical Impression  Pt admitted with above diagnosis. Pt currently with functional limitations due to the deficits listed below (see PT Problem List).  Pt will benefit from skilled PT to increase their independence and safety with mobility to allow discharge to the venue listed below.   Pt cooperative and motivated to work with PT, will continue to follow; May need HHPT depending on progress, pt reports his girlfriend can provide 24/7 assist at D/C     Follow Up Recommendations Home health PT    Equipment Recommendations  Rolling walker with 5" wheels    Recommendations for Other Services       Precautions / Restrictions Precautions Precautions: Fall      Mobility  Bed Mobility Overal bed mobility: Needs Assistance Bed Mobility: Supine to Sit     Supine to sit: Supervision     General bed mobility comments:  for safety   Transfers Overall transfer level: Needs assistance Equipment used: None;Rolling walker (2 wheeled) Transfers: Sit to/from [employer] Sit to Stand: Min assist Stand pivot transfers: Min assist       General transfer comment: incr time,  cues for safety; pt LEs weak, shaky with transfer, slight bil knee buckling  Ambulation/Gait Ambulation/Gait assistance: +2 safety/equipment;Min assist Ambulation Distance (Feet): 20 Feet (9' with HHA) Assistive device: Rolling walker (2 wheeled) Gait Pattern/deviations: Step-through pattern;Narrow base of support     General Gait Details:  cues for RW safety, step length, use of UEs/walker d/t LE weakness  Stairs            Wheelchair Mobility    Modified Rankin (Stroke Patients Only)       Balance Overall balance assessment: Needs assistance         Standing balance support: Bilateral upper extremity supported;During functional activity Standing balance-Leahy Scale: Poor                               Pertinent Vitals/Pain Pain Assessment: No/denies pain    Home Living Family/patient expects to be discharged to:: Private residence Living Arrangements: Other relatives   Type of Home: House       Home Layout: One level Home Equipment: Cane - single point;Crutches      Prior Function Level of Independence: Independent;Independent with assistive device(s)         Comments: using cane for amb prior to this adm     Hand Dominance        Extremity/Trunk Assessment   Upper Extremity Assessment: Overall WFL for tasks assessed           Lower Extremity Assessment: Generalized weakness         Communication   Communication: No difficulties  Cognition Arousal/Alertness: Awake/alert Behavior During Therapy: WFL for tasks assessed/performed Overall Cognitive Status: Within Functional Limits for tasks assessed                      General Comments      Exercises        Assessment/Plan  PT Assessment Patient needs continued PT services  PT Diagnosis Difficulty walking;Generalized weakness   PT Problem List Decreased strength;Decreased activity tolerance;Decreased mobility;Decreased knowledge of use of DME  PT Treatment Interventions DME instruction;Gait training;Functional mobility training;Therapeutic activities;Patient/family education;Therapeutic exercise   PT Goals (Current goals can be found in the Care Plan section) Acute Rehab PT Goals Patient Stated Goal: to get home, be able to get  out of bed PT Goal Formulation: With patient Time For Goal  Achievement: 05/30/15 Potential to Achieve Goals: Good    Frequency Min 3X/week   Barriers to discharge        Co-evaluation               End of Session Equipment Utilized During Treatment: Gait belt Activity Tolerance: Patient tolerated treatment well Patient left: in chair;with call bell/phone within reach;with chair alarm set Nurse Communication: Mobility status         Time: 0454-0981 PT Time Calculation (min) (ACUTE ONLY): 14 min   Charges:   PT Evaluation $Initial PT Evaluation Tier I: 1 Procedure     PT G CodesDrucilla Chalet 2015/06/01, 11:33 AM

## 2015-05-23 NOTE — Care Management Note (Signed)
Case Management Note  Patient Details  Name: BRIGHAM COBBINS MRN: [id number] Date of Birth: [birth year]  Subjective/Objective:   44 y/o m transferred from SDU. PT-HHPT.Will offer [facility] agency provider list.Await choice. Need HHPT, face to face order.                Action/Plan:d/c home w/HHC.   Expected Discharge Date:   (unknown)               Expected Discharge Plan:  Home w Home Health Services  In-House Referral:     Discharge planning Services  CM Consult  Post Acute Care Choice:    Choice offered to:     DME Arranged:    DME Agency:     HH Arranged:    HH Agency:     Status of Service:  In process, will continue to follow  Medicare Important Message Given:    Date Medicare IM Given:    Medicare IM give by:    Date Additional Medicare IM Given:    Additional Medicare Important Message give by:     If discussed at Long Length of Stay Meetings, dates discussed:    Additional Comments:  Lanier Clam, RN 05/23/2015, 8:30 PM

## 2015-05-23 NOTE — Progress Notes (Signed)
[facility] PROGRESS NOTE  Eddie Williams:[id number] DOB: 07/30/1971 DOA: 05/07/2015 PCP: [name], MD  HPI:  44 y/o man, hx polysubstance abuse, seizures, COPD / asthma, presented with B infiltrates and altered MS 9/17. Possible toxic ingestion of benzos and narcs. Progressed to respiratory failure and required intubation and MV, then hypotension with presumed septic shock.   SIGNIFICANT EVENTS: 9/17 Intubation & Admission 9/17 Worsening mental status & questionable seizure activity 9/19 Self extubated x2, significant agitation  9/20 ETT above cords, pt able to make sounds on NP exam. Tube adjusted and FOB at bedside for placement, BAL.  9/21 Severe vent dyssynchrony, started on pressure control ventilation 9/22 Sedate on 400 mcg fent, 8 versed, 48 propofol  9/23 heavy sedation with dilaudid, diprivan, versed  9/26 Good UOP, CVVHD stopped over the weekend, restarted on norepinephrine  9/27 ARDS protocol d/c'd, weaning, low grade fever, anemia s/p 1 unit PRBC 9/28 Extubated   ASSESSMENT AND PLAN:   ARDS:with acute hypercarbic respiratory failure/ HCAP/ aspiration pneumonia.  On RA with good oxygen sats.  Currently blood cultures are negative, respiratory cultures grew E COLI completed the course of antibiotics.   Septic shock: Resolved.    Acute renal failure due to rhabdomyolysis : Improved.    Hypokalemia: replete as needed and repeat in am.   Hypomagnesemia: repleted as needed.    Overdose with benzo's, narcotics and cocaine: no suicidal ideation.  Improved.   Hypernatremia/ metabolic acidosis: Improving, currently on dextrose fluids.  Nephrology signed off.   Dysphagia: SLP eval ON dysphagia 3 diet.    Initiate PT eval. Recommending home health PT. Will be ordered.    Tobacco abuse: Nicotine patch ordered.   Right 2 nd toe discolaration: X rays are negative for fracture.  Wound care consulted. Recommendations given.      Code Status: full code.  Family Communication: none at bedside.  Disposition Plan: d/c home tomorrow.    Consultants:  CRITICAL CARE.  Renal  SLP  Physical therapy  Procedures: CXR 9/16 >> no infiltrates CXR 9/17 >> Bilateral infiltrates and consolidation, perihilar predominant but involving other fields as well CT Head 9/17 >> no acute intracranial abnormality. EEG 9/18 >> abnormal EEG with presence of generalized background slowing / encephalopathy. NO seizures. ECHO 9/18 >> LVEF 55-60%, Grade 2 Diastolic dysfunction, LAE, RV mod dilated, systolic function mildly reduced, RA mod dilated, PA pressure 53 mm Hg Antibiotics:  Completed aztreonam.  HPI/Subjective: Feel better today.   Objective: Filed Vitals:   05/23/15 1317  BP: 141/87  Pulse: 75  Temp: 98.2 F (36.8 C)  Resp: 16    Intake/Output Summary (Last 24 hours) at 05/23/15 1845 Last data filed at 05/23/15 1721  Gross per 24 hour  Intake   1506 ml  Output   1676 ml  Net   -170 ml   Filed Weights   05/20/15 0500 05/22/15 0420 05/23/15 0409  Weight: 70.2 kg (154 lb 12.2 oz) 73.7 kg (162 lb 7.7 oz) 72.621 kg (160 lb 1.6 oz)    Exam:   General:  Alert ,comfortable, denies any new complaints.   Cardiovascular: s1s2  Respiratory: diminished at bases.   Abdomen: soft NT nd bs+  Musculoskeletal: no pedal edema.   Data Reviewed: Basic Metabolic Panel:  Recent Labs Lab 05/17/15 0555 05/18/15 0450 05/19/15 0500 05/20/15 1240 05/21/15 0540 05/21/15 0840 05/22/15 1355 05/23/15 0600  NA 143 148* 154* 150*  --  144 137 136  K 2.7* 2.9* 3.1* 3.0*  --  3.0* 3.5 3.0*  CL 99* 101 107 107  --  105 102 100*  CO2 30 36* 34* 35*  --  33* 27 27  GLUCOSE 103* 106* 99 136*  --  98 91 98  BUN 66* 70* 62* 44*  --  27* 12 10  CREATININE 2.20* 1.84* 1.40* 1.12  --  0.86 0.81 0.84  CALCIUM 8.0* 8.3* 8.5* 8.5*  --  7.9* 8.1* 8.4*  MG 2.0 1.8  --  1.8 1.5*  --  1.5* 2.2  PHOS 5.0*  --  4.1 2.9  --    --   --   --    Liver Function Tests:  Recent Labs Lab 05/19/15 0500 05/20/15 1240  AST  --  40  ALT  --  35  ALKPHOS  --  50  BILITOT  --  0.3  PROT  --  6.8  ALBUMIN 2.7* 2.8*   No results for input(s): LIPASE, AMYLASE in the last 168 hours. No results for input(s): AMMONIA in the last 168 hours. CBC:  Recent Labs Lab 05/17/15 0555 05/18/15 0450 05/19/15 0500 05/20/15 1240  WBC 15.9* 9.8 9.4 9.7  HGB 6.8* 7.4* 8.1* 8.5*  HCT 20.3* 23.0* 25.3* 27.2*  MCV 98.5 97.9 99.2 103.4*  PLT 150 211 334 401*   Cardiac Enzymes: No results for input(s): CKTOTAL, CKMB, CKMBINDEX, TROPONINI in the last 168 hours. BNP (last 3 results) No results for input(s): BNP in the last 8760 hours.  ProBNP (last 3 results) No results for input(s): PROBNP in the last 8760 hours.  CBG:  Recent Labs Lab 05/18/15 1541 05/18/15 2038 05/19/15 0003 05/19/15 0331 05/19/15 0738  GLUCAP 94 86 89 88 82    No results found for this or any previous visit (from the past 240 hour(s)).   Studies: Dg Foot Complete Right  06-07-15   CLINICAL DATA:  Pain 2nd digit right foot, which has a black spot at distal portion of digit,lateral to the toenail-pt tripped and fell on 05/01/2015  EXAM: RIGHT FOOT COMPLETE - 3+ VIEW  COMPARISON:  None.  FINDINGS: There is no evidence of fracture or dislocation. There is no evidence of arthropathy or other focal bone abnormality. Soft tissues are unremarkable.  IMPRESSION: Negative.   Electronically Signed   By: Esperanza Heir M.D.   On: 06-07-15 15:44    Scheduled Meds: . antiseptic oral rinse  7 mL Mouth Rinse QID  . buPROPion  300 mg Oral q morning - 10a  . chlorhexidine gluconate  15 mL Mouth Rinse BID  . feeding supplement  1 Container Oral TID BM  . folic acid  1 mg Oral Daily  . methadone  5 mg Oral Q12H  . nicotine  21 mg Transdermal Daily  . pantoprazole  40 mg Oral Daily  . potassium chloride  40 mEq Oral BID  . senna  1 tablet Oral Daily  .  sertraline  100 mg Oral q morning - 10a  . thiamine  100 mg Oral Daily   Continuous Infusions:    Active Problems:   HCAP (healthcare-associated pneumonia)   Septic shock (HCC)   Altered mental status   Malnutrition of moderate degree (HCC)    Time spent: 25 minutes.     [facility]  [facility] Pager [phone]. If 7PM-7AM, please contact night-coverage at [email or web address], password [facility] 05/23/2015, 6:45 PM  LOS: 16 days

## 2015-05-24 LAB — BASIC METABOLIC PANEL
Anion gap: 4 — ABNORMAL LOW (ref 5–15)
BUN: 8 mg/dL (ref 6–20)
CHLORIDE: 106 mmol/L (ref 101–111)
CO2: 29 mmol/L (ref 22–32)
CREATININE: 0.83 mg/dL (ref 0.61–1.24)
Calcium: 8.3 mg/dL — ABNORMAL LOW (ref 8.9–10.3)
GFR calc Af Amer: >60 mL/min (ref >60)
Glucose, Bld: 93 mg/dL (ref 65–99)
Potassium: 3.7 mmol/L (ref 3.5–5.1)
SODIUM: 139 mmol/L (ref 135–145)

## 2015-05-24 MED ORDER — NICOTINE 21 MG/24HR TD PT24: 21.0000 mg | MEDICATED_PATCH | Freq: Every day | TRANSDERMAL | Status: DC

## 2015-05-24 MED ORDER — THIAMINE HCL 100 MG PO TABS: 100.0000 mg | ORAL_TABLET | Freq: Every day | ORAL | Status: AC

## 2015-05-24 MED ORDER — TRAMADOL HCL 50 MG PO TABS: 100.0000 mg | ORAL_TABLET | Freq: Four times a day (QID) | ORAL | Status: DC | PRN

## 2015-05-24 MED ORDER — FOLIC ACID 1 MG PO TABS: 1.0000 mg | ORAL_TABLET | Freq: Every day | ORAL | Status: AC

## 2015-05-24 MED ORDER — BOOST / RESOURCE BREEZE PO LIQD: 1.0000 | Freq: Three times a day (TID) | ORAL | Status: AC

## 2015-05-24 MED ORDER — RESOURCE THICKENUP CLEAR PO POWD: ORAL | Status: AC

## 2015-05-24 NOTE — Care Management Note (Signed)
Case Management Note  Patient Details  Name: Eddie Williams MRN: [id number] Date of Birth: Jun 25, 1971  Subjective/Objective:   [facility] chosen for HHPT.TC Kristen rep aware of d/c , & HHPT order.Address patient will stay:[address] [place] [address] [phone].                Action/Plan:d/c home w/HHC.   Expected Discharge Date:   (unknown)               Expected Discharge Plan:  Home w Home Health Services  In-House Referral:     Discharge planning Services  CM Consult  Post Acute Care Choice:    Choice offered to:  Patient  DME Arranged:    DME Agency:     HH Arranged:  PT HH Agency:  [facility]  Status of Service:  Completed, signed off  Medicare Important Message Given:    Date Medicare IM Given:    Medicare IM give by:    Date Additional Medicare IM Given:    Additional Medicare Important Message give by:     If discussed at Long Length of Stay Meetings, dates discussed:    Additional Comments:  Lanier Clam, RN 05/24/2015, 11:50 AM

## 2015-05-24 NOTE — Care Management Important Message (Signed)
Important Message  Patient Details  Name: MOSI HANNOLD MRN: [id number] Date of Birth: 1971-06-09   Medicare Important Message Given:  Yes-second notification given    Haskell Flirt 05/24/2015, 12:09 PMImportant Message  Patient Details  Name: ZYAIR RUSSI MRN: [id number] Date of Birth: 28-Jun-1971   Medicare Important Message Given:  Yes-second notification given    Haskell Flirt 05/24/2015, 12:09 PM

## 2015-05-24 NOTE — Discharge Summary (Signed)
Physician Discharge Summary  Eddie Williams ZOX:[id number] DOB: 12/18/1970 DOA: 43/17/2016  PCP: [name], MD  Admit date: 05/07/2015 Discharge date: 05/24/2015  Time spent: 30 minutes  Recommendations for Outpatient Follow-up:  Follow up with PCP in one week.  Discharge Diagnoses:  Active Problems:   HCAP (healthcare-associated pneumonia)   Septic shock (HCC)   Altered mental status   Malnutrition of moderate degree (HCC)   Discharge Condition:improved.   Diet recommendation: regular diet  Filed Weights   05/22/15 0420 05/23/15 0409 05/24/15 0512  Weight: 73.7 kg (162 lb 7.7 oz) 72.621 kg (160 lb 1.6 oz) 72.53 kg (159 lb 14.4 oz)    History of present illness:  44 y/o man, hx polysubstance abuse, seizures, COPD / asthma, presented with B infiltrates and altered MS 9/17. Possible toxic ingestion of benzos and narcs. Progressed to respiratory failure and required intubation and MV, then hypotension with presumed septic shock.  Hospital Course:  ARDS:with acute hypercarbic respiratory failure/ HCAP/ aspiration pneumonia.  On RA with good oxygen sats.  Currently blood cultures are negative, respiratory cultures grew E COLI completed the course of antibiotics.   Septic shock: Resolved.    Acute renal failure due to rhabdomyolysis : Improved.    Hypokalemia: replete as needed and repeat in am.   Hypomagnesemia: repleted as needed.    Overdose with benzo's, narcotics and cocaine: no suicidal ideation.  Improved.   Hypernatremia/ metabolic acidosis: Improving, currently on dextrose fluids.  Nephrology signed off.   Dysphagia: SLP eval ON dysphagia 3 diet.    Initiate PT eval. Recommending home health PT. Will be ordered.    Tobacco abuse: Nicotine patch ordered.   Right 2 nd toe discolaration: X rays are negative for fracture.  Wound care consulted. Recommendations given  Procedures: SIGNIFICANT EVENTS: 9/17 Intubation & Admission 9/17  Worsening mental status & questionable seizure activity 9/19 Self extubated x2, significant agitation  9/20 ETT above cords, pt able to make sounds on NP exam. Tube adjusted and FOB at bedside for placement, BAL.  9/21 Severe vent dyssynchrony, started on pressure control ventilation 9/22 Sedate on 400 mcg fent, 8 versed, 48 propofol  9/23 heavy sedation with dilaudid, diprivan, versed  9/26 Good UOP, CVVHD stopped over the weekend, restarted on norepinephrine  9/27 ARDS protocol d/c'd, weaning, low grade fever, anemia s/p 1 unit PRBC 9/28 Extubated   Consultations:  PCCM.  Discharge Exam: Filed Vitals:   05/24/15 0512  BP: 152/89  Pulse: 75  Temp: 98.2 F (36.8 C)  Resp: 16    General: alert afebrile comfortable  Cardiovascular: s1s2 Respiratory:ctab.  Discharge Instructions   Discharge Instructions    Diet - low sodium heart healthy    Complete by:  As directed      Diet - low sodium heart healthy    Complete by:  As directed      Discharge instructions    Complete by:  As directed   Follow upw ith PCP in one week.     Discharge instructions    Complete by:  As directed   Follow up with PCP tomorrow, and get referral to methadone clinic.          Current Discharge Medication List    START taking these medications   Details  feeding supplement (BOOST / RESOURCE BREEZE) LIQD Take 1 Container by mouth 3 (three) times daily between meals. Refills: 0    folic acid (FOLVITE) 1 MG tablet Take 1 tablet (1 mg total) by mouth  daily.    Maltodextrin-Xanthan Gum (RESOURCE THICKENUP CLEAR) POWD As needed.    nicotine (NICODERM CQ - DOSED IN MG/24 HOURS) 21 mg/24hr patch Place 1 patch (21 mg total) onto the skin daily. Qty: 28 patch, Refills: 0    thiamine 100 MG tablet Take 1 tablet (100 mg total) by mouth daily.    traMADol (ULTRAM) 50 MG tablet Take 2 tablets (100 mg total) by mouth every 6 (six) hours as needed for moderate pain. Qty: 20 tablet,  Refills: 0      CONTINUE these medications which have NOT CHANGED   Details  acetaminophen (TYLENOL) 500 MG tablet Take 1,000 mg by mouth every 6 (six) hours as needed for moderate pain.     albuterol (PROVENTIL HFA;VENTOLIN HFA) 108 (90 BASE) MCG/ACT inhaler Inhale 2 puffs into the lungs every 6 (six) hours as needed for wheezing or shortness of breath.    albuterol (PROVENTIL) (2.5 MG/3ML) 0.083% nebulizer solution Take 2.5 mg by nebulization every 6 (six) hours as needed for wheezing or shortness of breath.    atenolol-chlorthalidone (TENORETIC) 100-25 MG per tablet Take 1 tablet by mouth daily. Refills: 5    buPROPion (WELLBUTRIN XL) 300 MG 24 hr tablet Take 300 mg by mouth every morning.     fluticasone (FLONASE) 50 MCG/ACT nasal spray Place 1-2 sprays into both nostrils daily as needed for allergies or rhinitis.     loratadine (CLARITIN) 10 MG tablet Take 10 mg by mouth every morning.     omeprazole (PRILOSEC) 20 MG capsule Take 20 mg by mouth daily.    sertraline (ZOLOFT) 100 MG tablet Take 100 mg by mouth every morning.       STOP taking these medications     clonazePAM (KLONOPIN) 1 MG tablet      gabapentin (NEURONTIN) 300 MG capsule      HYDROcodone-acetaminophen (NORCO) 10-325 MG per tablet      lisinopril-hydrochlorothiazide (PRINZIDE,ZESTORETIC) 10-12.5 MG per tablet        Allergies  Allergen Reactions  . Penicillins Anaphylaxis  . Ibuprofen Nausea Only  . Nsaids Nausea And Vomiting  . Erythromycin Rash   Follow-up Information    Follow up with AVBUERE,EDWIN A, MD. Schedule an appointment as soon as possible for a visit in 1 week.   Specialty:  Internal Medicine   Contact information:   [address] [place] [address] [phone]       Follow up with [facility].   Why:  HHPT   Contact information:   [address] [place] [address] [phone]       Follow up with [name], MD.   Specialty:   Internal Medicine   Contact information:   [address] [place] [address] [phone]        The results of significant diagnostics from this hospitalization (including imaging, microbiology, ancillary and laboratory) are listed below for reference.    Significant Diagnostic Studies: Dg Chest 2 View  05/06/2015   CLINICAL DATA:  Several day history of cough and congestion  EXAM: CHEST  2 VIEW  COMPARISON:  April 08, 2015  FINDINGS: Lungs are clear. Heart size and pulmonary vascularity are normal. No adenopathy. No bone lesions.  IMPRESSION: No edema or consolidation.   Electronically Signed   By: Bretta Bang III M.D.   On: 05/06/2015 07:45   Dg Abd 1 View  05/09/2015   CLINICAL DATA:  Status post OG tube placement  EXAM: ABDOMEN - 1  VIEW  COMPARISON:  9/17/sick  FINDINGS: Scattered large and small bowel gas is noted. A gastric catheter is noted within the stomach directed towards the body. The overall appearance is similar to that seen on the prior exam.  IMPRESSION: OG tube within the mid stomach.   Electronically Signed   By: Alcide Clever M.D.   On: 05/09/2015 17:19   Dg Abd 1 View  05/07/2015   CLINICAL DATA:  Encounter for orogastric tube placement.  EXAM: ABDOMEN - 1 VIEW  COMPARISON:  None.  FINDINGS: Orogastric tube passes below the diaphragm into the mid to distal stomach.  Unremarkable bowel gas pattern.  IMPRESSION: Orogastric tube is well positioned with the tip in the mid to distal stomach.   Electronically Signed   By: Amie Portland M.D.   On: 05/07/2015 21:32   Ct Head Wo Contrast  05/08/2015   CLINICAL DATA:  Altered mental status.  EXAM: CT HEAD WITHOUT CONTRAST  TECHNIQUE: Contiguous axial images were obtained from the base of the skull through the vertex without intravenous contrast.  COMPARISON:  04/24/2015  FINDINGS: No intracranial hemorrhage, mass effect, or midline shift. No hydrocephalus. The basilar cisterns are patent. No evidence of territorial  infarct. No intracranial fluid collection. Calvarium is intact. Scattered mucosal thickening of the ethmoid air cells. No fluid levels. Mastoid air cells are clear.  IMPRESSION: No acute intracranial abnormality.   Electronically Signed   By: Rubye Oaks M.D.   On: 05/08/2015 00:04   Ct Head Wo Contrast  04/24/2015   CLINICAL DATA:  44 year old male with history of trauma from a fall onto concrete.  EXAM: CT HEAD WITHOUT CONTRAST  CT CERVICAL SPINE WITHOUT CONTRAST  TECHNIQUE: Multidetector CT imaging of the head and cervical spine was performed following the standard protocol without intravenous contrast. Multiplanar CT image reconstructions of the cervical spine were also generated.  COMPARISON:  Head CT 10/01/2013.  FINDINGS: CT HEAD FINDINGS  No acute displaced skull fractures are identified. No acute intracranial abnormality. Specifically, no evidence of acute post-traumatic intracranial hemorrhage, no definite regions of acute/subacute cerebral ischemia, no focal mass, mass effect, hydrocephalus or abnormal intra or extra-axial fluid collections. Mild multifocal mucosal thickening throughout the paranasal sinuses, and some frothy secretions in the left frontoethmoidal recess. Mastoids are well pneumatized.  CT CERVICAL SPINE FINDINGS  No acute displaced fractures of the cervical spine. Alignment is anatomic. Prevertebral soft tissues are normal. Visualized portions of the upper thorax are unremarkable.  IMPRESSION: 1. No evidence of significant acute traumatic injury to the skull, brain or cervical spine. 2. The appearance of the brain is normal. 3. Mild paranasal sinus disease, as above.   Electronically Signed   By: Trudie Reed M.D.   On: 04/24/2015 19:54   Ct Cervical Spine Wo Contrast  04/24/2015   CLINICAL DATA:  44 year old male with history of trauma from a fall onto concrete.  EXAM: CT HEAD WITHOUT CONTRAST  CT CERVICAL SPINE WITHOUT CONTRAST  TECHNIQUE: Multidetector CT imaging of the  head and cervical spine was performed following the standard protocol without intravenous contrast. Multiplanar CT image reconstructions of the cervical spine were also generated.  COMPARISON:  Head CT 10/01/2013.  FINDINGS: CT HEAD FINDINGS  No acute displaced skull fractures are identified. No acute intracranial abnormality. Specifically, no evidence of acute post-traumatic intracranial hemorrhage, no definite regions of acute/subacute cerebral ischemia, no focal mass, mass effect, hydrocephalus or abnormal intra or extra-axial fluid collections. Mild multifocal mucosal thickening throughout the paranasal sinuses,  and some frothy secretions in the left frontoethmoidal recess. Mastoids are well pneumatized.  CT CERVICAL SPINE FINDINGS  No acute displaced fractures of the cervical spine. Alignment is anatomic. Prevertebral soft tissues are normal. Visualized portions of the upper thorax are unremarkable.  IMPRESSION: 1. No evidence of significant acute traumatic injury to the skull, brain or cervical spine. 2. The appearance of the brain is normal. 3. Mild paranasal sinus disease, as above.   Electronically Signed   By: Trudie Reed M.D.   On: 04/24/2015 19:54   Dg Chest Port 1 View  05/19/2015   CLINICAL DATA:  ARDS.  Pneumonia and sepsis.  EXAM: PORTABLE CHEST 1 VIEW  COMPARISON:  Yesterday  FINDINGS: Interval tracheal and esophageal extubation. Lung volumes remain stable. Unchanged bilateral lung opacity with mild apical predominance. Stable positioning of right IJ central line, tip near the SVC confluence. Normal heart size. No air leak.  IMPRESSION: Stable inflation after extubation. History of pneumonia with stable lung opacities.   Electronically Signed   By: Marnee Spring M.D.   On: 05/19/2015 05:37   Dg Chest Port 1 View  05/18/2015   CLINICAL DATA:  ARDS  EXAM: PORTABLE CHEST 1 VIEW  COMPARISON:  05/17/2015  FINDINGS: Support devices are stable. Heart is normal size. Stable interstitial  prominence and patchy airspace disease, most pronounced in the upper lobes and left base. No effusions. No acute bony abnormality. No real change.  IMPRESSION: Stable interstitial prominence and patchy bilateral airspace disease, edema versus infection. No change.   Electronically Signed   By: Charlett Nose M.D.   On: 05/18/2015 07:21   Dg Chest Port 1 View  05/17/2015   CLINICAL DATA:  Acute respiratory failure.  Endotracheal tube.  EXAM: PORTABLE CHEST 1 VIEW  COMPARISON:  05/16/2015  FINDINGS: Endotracheal tube remains in place with tip approximately 4.2 cm above the carina. Bilateral jugular central venous catheters terminate over the upper SVC. Enteric tube courses into the upper abdomen with tip not imaged.  Cardiomediastinal silhouette is within normal limits. Mild, diffuse bilateral interstitial opacities as well as patchy airspace opacities most notable in the left lung base overall have not significantly changed. No sizable pleural effusion or pneumothorax is identified.  IMPRESSION: 1. Unchanged interstitial and patchy airspace opacities which may reflect edema or infection. 2. Unchanged support devices.   Electronically Signed   By: Sebastian Ache M.D.   On: 05/17/2015 07:31   Dg Chest Port 1 View  05/16/2015   CLINICAL DATA:  Acute respiratory failure with septic shock, history of asthma -COPD, current tobacco use  EXAM: PORTABLE CHEST 1 VIEW  COMPARISON:  Portable chest x-ray of May 15, 2015  FINDINGS: The lungs are adequately inflated. The interstitial markings remain increased bilaterally. The cardiac silhouette is normal in size. The pulmonary vascularity is not engorged. The mediastinum is normal in width. There is no pleural effusion. The endotracheal tube tip lies 4.8 cm above the carina. The esophagogastric tube tip projects below the inferior margin of the image. The you right internal jugular dual-lumen catheter tip projects over the proximal SVC.  IMPRESSION: Persistent interstitial  prominence consistent with interstitial edema or pneumonia. No alveolar infiltrates are demonstrated.   Electronically Signed   By: David  [place] M.D.   On: 05/16/2015 07:38   Dg Chest Port 1 View  05/15/2015   CLINICAL DATA:  Acute respiratory failure, COPD.  EXAM: PORTABLE CHEST 1 VIEW  COMPARISON:  05/14/2015  FINDINGS: Support devices are stable. Heart  is normal size. Improving bilateral airspace opacities. Continued diffuse interstitial prominence. No effusions. No acute bony abnormality.  IMPRESSION: Improving bilateral airspace opacities with continued diffuse interstitial prominence.   Electronically Signed   By: Charlett Nose M.D.   On: 05/15/2015 07:27   Dg Chest Port 1 View  05/14/2015   CLINICAL DATA:  Respiratory failure  EXAM: PORTABLE CHEST 1 VIEW  COMPARISON:  05/13/2015  FINDINGS: Interval retraction of the endotracheal tube. The tip is now 10 cm above the carina. Remainder of the support devices are stable. Patchy bilateral airspace disease again noted, increasing in the right upper lobe and improving in the left upper lobe. Heart is normal size. No visible effusions or pneumothorax.  IMPRESSION: Retraction of the endotracheal tube, now 10 cm above the carina.  Patchy bilateral airspace disease, improving on the left, while increasing in the right upper lobe. This could represent edema or infection.   Electronically Signed   By: Charlett Nose M.D.   On: 05/14/2015 09:10   Dg Chest Port 1 View  05/13/2015   CLINICAL DATA:  Status post right internal jugular dialysis catheter placement.  EXAM: PORTABLE CHEST 1 VIEW  COMPARISON:  May 12, 2015.  FINDINGS: The heart size and mediastinal contours are within normal limits. Endotracheal and nasogastric tubes are in grossly good position. No pneumothorax or significant pleural effusion is noted. Left internal jugular catheter is unchanged with tip in expected position of the SVC. Interval placement of right internal jugular dialysis catheter is  noted with distal tip in expected position of the SVC. Stable bilateral diffuse lung opacities are noted concerning for pneumonia or edema. The visualized skeletal structures are unremarkable.  IMPRESSION: Stable bilateral diffuse lung opacities are noted consistent with pneumonia or edema. Stable support apparatus. Interval placement of right internal jugular catheter with distal tip overlying expected position of SVC. No pneumothorax is noted.   Electronically Signed   By: Lupita Raider, M.D.   On: 05/13/2015 12:38   Dg Chest Port 1 View  05/12/2015   CLINICAL DATA:  Adult respiratory distress syndrome.  EXAM: PORTABLE CHEST 1 VIEW  COMPARISON:  Same day.  FINDINGS: Stable cardiomediastinal silhouette. Endotracheal tube is in grossly good position with distal tip 2 cm above the carina. Left internal jugular catheter is unchanged with distal tip overlying expected position of the SVC. Nasogastric tube is seen entering stomach. Stable bilateral diffuse lung opacities are noted concerning for pneumonia or less likely edema. No pneumothorax or pleural effusion is noted. Bony thorax is unremarkable.  IMPRESSION: Endotracheal tube in grossly good position. Stable support apparatus. Stable bilateral lung opacities are noted concerning for pneumonia or possibly edema.   Electronically Signed   By: Lupita Raider, M.D.   On: 05/12/2015 10:41   Dg Chest Port 1 View  05/12/2015   CLINICAL DATA:  Respiratory failure.  EXAM: PORTABLE CHEST - 1 VIEW  COMPARISON:  05/11/2015 .  FINDINGS: Endotracheal tube, left IJ line, NG tube in stable position. Stable cardiomegaly. Diffuse dense bilateral pulmonary alveolar infiltrates. Basilar atelectasis. No pleural effusion or pneumothorax.  IMPRESSION: 1. Lines and tubes in stable position. 2. Diffuse dense bilateral pulmonary infiltrates. No interim improvement. Basilar atelectasis .   Electronically Signed   By: Maisie Fus  Register   On: 05/12/2015 07:33   Dg Chest Port 1  View  05/11/2015   CLINICAL DATA:  Acute respiratory failure with hypoxemia, COPD, smoker.  EXAM: PORTABLE CHEST - 1 VIEW  COMPARISON:  Chest  x-ray from earlier same day. Comparison also to chest x-rays of 05/10/2015 and 05/07/2015.  FINDINGS: The diffuse bilateral airspace opacities are not significantly changed compared to yesterday's exam. No new lung findings. No large pleural effusion. No pneumothorax.  Endotracheal tube now appears well positioned with tip approximately 2 cm above the level of the carina. Enteric tube passes into the stomach. Cardiomediastinal silhouette appears grossly stable in size and configuration.  IMPRESSION: 1. Persistent diffuse bilateral airspace opacities, not significantly changed, with differential possibilities again including diffuse bilateral pneumonia, pulmonary edema, and pulmonary hemorrhage. ARDS? 2. Endotracheal tube now well positioned with tip above the level of the carina.   Electronically Signed   By: Bary Richard M.D.   On: 05/11/2015 10:10   Dg Chest Port 1 View  05/11/2015   CLINICAL DATA:  Respiratory failure.  EXAM: PORTABLE CHEST - 1 VIEW  COMPARISON:  05/10/2015.  FINDINGS: Endotracheal tube tip in the orifice of the right mainstem bronchus. Proximal repositioning of approximately 3 cm suggested. Left IJ line and NG tube in stable position. Heart size stable. Diffuse dense bilateral pulmonary infiltrates are present. No interim change. No prominent pleural effusion. No pneumothorax .  IMPRESSION: 1. Endotracheal tube tip in the orifice the right mainstem bronchus. Proximal repositioning of approximately 3 cm suggested. 2. Left IJ line and NG tube in stable position. 3. Persistent unchanged dense bilateral pulmonary infiltrates. Critical Value/emergent results were called by telephone at the time of interpretation on 05/11/2015 at 7:56 am to nurse Ruby, who verbally acknowledged these results.   Electronically Signed   By: Maisie Fus  Register   On: 05/11/2015  07:57   Dg Chest Port 1 View  05/10/2015   CLINICAL DATA:  Pneumonia.  EXAM: PORTABLE CHEST - 1 VIEW  COMPARISON:  05/09/2015.  FINDINGS: Endotracheal tube and left IJ line in stable position. Interim placement of NG tube. Tip is below the left hemidiaphragm. Diffuse bilateral pulmonary alveolar infiltrates are noted. These are unchanged. No pleural effusion or pneumothorax.  IMPRESSION: 1. Interim placement of NG tube, its tip is below the left hemidiaphragm. Endotracheal tube and left IJ line stable position. 2. Persistent diffuse bilateral pulmonary alveolar infiltrates. No interim change.   Electronically Signed   By: Maisie Fus  Register   On: 05/10/2015 07:15   Dg Chest Port 1 View  05/10/2015   CLINICAL DATA:  ETT placement  EXAM: PORTABLE CHEST - 1 VIEW  COMPARISON:  05/09/2015 at 1620 hours  FINDINGS: Endotracheal tube terminates 5 cm above the carina.  Multifocal patchy opacities, upper lobe predominant, suggesting multifocal pneumonia. No pleural effusion or pneumothorax.  The heart is normal in size.  Left IJ venous catheter terminates in the upper SVC.  IMPRESSION: Endotracheal tube terminates 5 cm above the carina.  Stable multifocal pneumonia.   Electronically Signed   By: Charline Bills M.D.   On: 05/10/2015 01:01   Dg Chest Port 1 View  05/09/2015   CLINICAL DATA:  Acute respiratory failure. Self extubation and Ree intubated.  EXAM: PORTABLE CHEST - 1 VIEW  COMPARISON:  05/09/2015  FINDINGS: Endotracheal tube is 7 cm above the carina. Left central line and NG tube are unchanged. Bilateral airspace opacities are again noted, most confluent in the upper lobes, no change. Heart is normal size.  IMPRESSION: Endotracheal tube 7 cm above the carina.  Bilateral airspace disease again noted, stable.   Electronically Signed   By: Charlett Nose M.D.   On: 05/09/2015 16:31   Dg Chest [facility]  1 View  05/09/2015   CLINICAL DATA:  Follow-up acute respiratory failure with hypoxemia.  EXAM: PORTABLE CHEST -  1 VIEW  COMPARISON:  Chest x-rays dated 05/07/2015 in 04/2015 2016.  FINDINGS: There are persistent extensive airspace opacities throughout both lungs, however, there has been a slight interval improvement in aeration within both lungs compared to yesterday's exam. Suspect small left pleural effusion. No pneumothorax. Cardiomediastinal silhouette appears stable in size and configuration.  Endotracheal tube is well positioned with tip just above the level of the carina. Left-sided IJ central line is stable in position with tip at the upper margin of the SVC. Enteric tube passes below the diaphragm.  IMPRESSION: Persistent extensive airspace opacities bilaterally, most suggestive of pulmonary edema pattern, but at least mildly improved compared to yesterday's exam. ARDS?  Probable small left pleural effusion.  Tubes and lines appear adequately positioned.   Electronically Signed   By: Bary Richard M.D.   On: 05/09/2015 13:17   Dg Chest Portable 1 View  05/07/2015   CLINICAL DATA:  Central line placement  EXAM: PORTABLE CHEST - 1 VIEW  COMPARISON:  Prior film same day  FINDINGS: Bilateral extensive infiltrates again noted. Stable endotracheal tube position. There is left IJ central line with tip in SVC. No pneumothorax.  IMPRESSION: Extensive bilateral infiltrates again noted. Stable endotracheal tube position. Left IJ central line with tip in SVC. No pneumothorax.   Electronically Signed   By: Natasha Mead M.D.   On: 05/07/2015 17:10   Dg Chest Portable 1 View  05/07/2015   CLINICAL DATA:  Intubated  EXAM: PORTABLE CHEST - 1 VIEW  COMPARISON:  05/07/2015  FINDINGS: Cardiomediastinal silhouette is stable. Endotracheal tube in place with tip 2.5 cm above the carina. Extensive bilateral infiltrates again noted. No pneumothorax.  IMPRESSION: Extensive bilateral infiltrates again noted. Endotracheal tube in place. No pneumothorax.   Electronically Signed   By: Natasha Mead M.D.   On: 05/07/2015 15:45   Dg Chest Port 1  View  05/07/2015   CLINICAL DATA:  Unresponsive, shortness of breath  EXAM: PORTABLE CHEST - 1 VIEW  COMPARISON:  05/06/2015  FINDINGS: New extensive perihilar and mid lung consolidative airspace disease since yesterday, compatible with severe acute pneumonia, aspiration, alveolar edema, or hemorrhage. No effusion or pneumothorax. Trachea is midline. Normal heart size and vascularity.  IMPRESSION: Extensive acute perihilar and mid lung consolidative bilateral airspace process as above.  No effusion or pneumothorax.   Electronically Signed   By: Judie Petit.  Shick M.D.   On: 05/07/2015 13:43   Dg Abd Portable 1v  05/17/2015   CLINICAL DATA:  44 year old male with constipation and abdominal discomfort. Subsequent encounter.  EXAM: PORTABLE ABDOMEN - 1 VIEW  COMPARISON:  05/09/2015.  FINDINGS: Nasogastric tube side all just beyond the gastroesophageal junction with the tip at the expected level of the gastric body unchanged in position from the prior exam.  Foley catheter in place.  Moderate stool throughout the descending colon with prominent amount of stool rectosigmoid colon.  The possibility of free intraperitoneal air cannot be assessed on a supine view.  IMPRESSION: Moderate stool throughout the descending colon with prominent amount of stool rectosigmoid colon.   Electronically Signed   By: Lacy Duverney M.D.   On: 05/17/2015 09:16   Dg Foot Complete Right  05/22/2015   CLINICAL DATA:  Pain 2nd digit right foot, which has a black spot at distal portion of digit,lateral to the toenail-pt tripped and fell on 05/01/2015  EXAM:  RIGHT FOOT COMPLETE - 3+ VIEW  COMPARISON:  None.  FINDINGS: There is no evidence of fracture or dislocation. There is no evidence of arthropathy or other focal bone abnormality. Soft tissues are unremarkable.  IMPRESSION: Negative.   Electronically Signed   By: Esperanza Heir M.D.   On: 05/22/2015 15:44    Microbiology: No results found for this or any previous visit (from the past 240  hour(s)).   Labs: Basic Metabolic Panel:  Recent Labs Lab 05/18/15 0450 05/19/15 0500 05/20/15 1240 05/21/15 0540 05/21/15 0840 05/22/15 1355 05/23/15 0600 05/24/15 0455  NA 148* 154* 150*  --  144 137 136 139  K 2.9* 3.1* 3.0*  --  3.0* 3.5 3.0* 3.7  CL 101 107 107  --  105 102 100* 106  CO2 36* 34* 35*  --  33* GLUCOSE 106* 99 136*  --  98 91 98 93  BUN 70* 62* 44*  --  27* CREATININE 1.84* 1.40* 1.12  --  0.86 0.81 0.84 0.83  CALCIUM 8.3* 8.5* 8.5*  --  7.9* 8.1* 8.4* 8.3*  MG 1.8  --  1.8 1.5*  --  1.5* 2.2  --   PHOS  --  4.1 2.9  --   --   --   --   --    Liver Function Tests:  Recent Labs Lab 05/19/15 0500 05/20/15 1240  AST  --  40  ALT  --  35  ALKPHOS  --  50  BILITOT  --  0.3  PROT  --  6.8  ALBUMIN 2.7* 2.8*   No results for input(s): LIPASE, AMYLASE in the last 168 hours. No results for input(s): AMMONIA in the last 168 hours. CBC:  Recent Labs Lab 05/18/15 0450 05/19/15 0500 05/20/15 1240  WBC 9.8 9.4 9.7  HGB 7.4* 8.1* 8.5*  HCT 23.0* 25.3* 27.2*  MCV 97.9 99.2 103.4*  PLT 211 334 401*   Cardiac Enzymes: No results for input(s): CKTOTAL, CKMB, CKMBINDEX, TROPONINI in the last 168 hours. BNP: BNP (last 3 results) No results for input(s): BNP in the last 8760 hours.  ProBNP (last 3 results) No results for input(s): PROBNP in the last 8760 hours.  CBG:  Recent Labs Lab 05/18/15 1541 05/18/15 2038 05/19/15 0003 05/19/15 0331 05/19/15 0738  GLUCAP 94 86 89 88 82       Signed:  Len Kluver  [facility] 05/24/2015, 12:00 PM

## 2015-08-17 ENCOUNTER — Telehealth: Payer: Self-pay | Admitting: Internal Medicine

## 2015-08-17 NOTE — Telephone Encounter (Signed)
Unfortunately, I'm not able to accept any more new patients at this time.  I'm sorry! Thank you!  

## 2015-08-17 NOTE — Telephone Encounter (Signed)
Pt was a pt years ago and would like to come back to see you.. Are to willing to take him back on as a pt?

## 2015-09-19 ENCOUNTER — Encounter (HOSPITAL_COMMUNITY): Payer: Self-pay | Admitting: Family Medicine

## 2015-09-19 ENCOUNTER — Emergency Department (HOSPITAL_COMMUNITY)
Admission: EM | Admit: 2015-09-19 | Discharge: 2015-09-19 | Disposition: A | Discharge status: Home / Self Care | Payer: Medicare Other | Attending: Emergency Medicine | Admitting: Emergency Medicine

## 2015-09-19 DIAGNOSIS — H04131 Lacrimal cyst, right lacrimal gland: Secondary | ICD-10-CM | POA: Diagnosis not present

## 2015-09-19 DIAGNOSIS — G8929 Other chronic pain: Secondary | ICD-10-CM | POA: Diagnosis not present

## 2015-09-19 DIAGNOSIS — H5711 Ocular pain, right eye: Secondary | ICD-10-CM | POA: Diagnosis present

## 2015-09-19 DIAGNOSIS — Z87442 Personal history of urinary calculi: Secondary | ICD-10-CM | POA: Insufficient documentation

## 2015-09-19 DIAGNOSIS — F419 Anxiety disorder, unspecified: Secondary | ICD-10-CM | POA: Diagnosis not present

## 2015-09-19 DIAGNOSIS — F17210 Nicotine dependence, cigarettes, uncomplicated: Secondary | ICD-10-CM | POA: Insufficient documentation

## 2015-09-19 DIAGNOSIS — Z7951 Long term (current) use of inhaled steroids: Secondary | ICD-10-CM | POA: Insufficient documentation

## 2015-09-19 DIAGNOSIS — Z880 Allergy status to penicillin: Secondary | ICD-10-CM | POA: Insufficient documentation

## 2015-09-19 DIAGNOSIS — J449 Chronic obstructive pulmonary disease, unspecified: Secondary | ICD-10-CM | POA: Diagnosis not present

## 2015-09-19 DIAGNOSIS — I10 Essential (primary) hypertension: Secondary | ICD-10-CM | POA: Diagnosis not present

## 2015-09-19 DIAGNOSIS — Z79899 Other long term (current) drug therapy: Secondary | ICD-10-CM | POA: Diagnosis not present

## 2015-09-19 NOTE — ED Provider Notes (Signed)
CSN: [id number]     Arrival date & time 09/19/15  0630 History   First MD Initiated Contact with Patient 09/19/15 [phone]     Chief Complaint  Patient presents with  . Eye Pain     (Consider location/radiation/quality/duration/timing/severity/associated sxs/prior Treatment) HPI   45 year old male who presents complaining of right eye pain. Patient states he noticed discomfort to be lateral aspects of his right eye which started yesterday and has been progressively persistent throughout today. He notice a "pimple" in the in the eyelid is right eye and was concerned. He reported having completed eyes drainage and denies any vision changes and denied any crust around his eye. He denies any injury. Patient has no other complaint.  Past Medical History  Diagnosis Date  . Anxiety   . Chronic back pain   . Seizures (HCC)   . COPD (chronic obstructive pulmonary disease) (HCC)   . Hypertension   . Kidney stones   . Asthma    Past Surgical History  Procedure Laterality Date  . Back surgery    . Appendectomy    . Ankle surgery     Family History  Problem Relation Age of Onset  . CAD Other   . Cancer Other   . Diabetes Other   . Diabetes type II Mother   . Depression Mother   . CAD Father    Social History  Substance Use Topics  . Smoking status: Current Every Day Smoker -- 0.50 packs/day    Types: Cigarettes  . Smokeless tobacco: None  . Alcohol Use: Yes     Comment: Few drinks a month    Review of Systems  Constitutional: Negative for fever.  Eyes: Positive for pain. Negative for discharge, itching and visual disturbance.  Skin: Negative for rash and wound.      Allergies  Penicillins; Ibuprofen; Nsaids; and Erythromycin  Home Medications   Prior to Admission medications   Medication Sig Start Date End Date Taking? Authorizing Provider  acetaminophen (TYLENOL) 500 MG tablet Take 1,000 mg by mouth every 6 (six) hours as needed for moderate pain.     Historical  Provider, MD  albuterol (PROVENTIL HFA;VENTOLIN HFA) 108 (90 BASE) MCG/ACT inhaler Inhale 2 puffs into the lungs every 6 (six) hours as needed for wheezing or shortness of breath.    Historical Provider, MD  albuterol (PROVENTIL) (2.5 MG/3ML) 0.083% nebulizer solution Take 2.5 mg by nebulization every 6 (six) hours as needed for wheezing or shortness of breath.    Historical Provider, MD  atenolol-chlorthalidone (TENORETIC) 100-25 MG per tablet Take 1 tablet by mouth daily. 04/04/15   Historical Provider, MD  buPROPion (WELLBUTRIN XL) 300 MG 24 hr tablet Take 300 mg by mouth every morning.     Historical Provider, MD  feeding supplement (BOOST / RESOURCE BREEZE) LIQD Take 1 Container by mouth 3 (three) times daily between meals. 05/24/15   Kathlen Mody, MD  fluticasone (FLONASE) 50 MCG/ACT nasal spray Place 1-2 sprays into both nostrils daily as needed for allergies or rhinitis.     Historical Provider, MD  folic acid (FOLVITE) 1 MG tablet Take 1 tablet (1 mg total) by mouth daily. 05/24/15   Kathlen Mody, MD  loratadine (CLARITIN) 10 MG tablet Take 10 mg by mouth every morning.     Historical Provider, MD  Maltodextrin-Xanthan Gum (RESOURCE THICKENUP CLEAR) POWD As needed. 05/24/15   Kathlen Mody, MD  nicotine (NICODERM CQ - DOSED IN MG/24 HOURS) 21 mg/24hr patch Place 1 patch (21  mg total) onto the skin daily. 05/24/15   Kathlen Mody, MD  omeprazole (PRILOSEC) 20 MG capsule Take 20 mg by mouth daily.    Historical Provider, MD  sertraline (ZOLOFT) 100 MG tablet Take 100 mg by mouth every morning.     Historical Provider, MD  thiamine 100 MG tablet Take 1 tablet (100 mg total) by mouth daily. 05/24/15   Kathlen Mody, MD  traMADol (ULTRAM) 50 MG tablet Take 2 tablets (100 mg total) by mouth every 6 (six) hours as needed for moderate pain. 05/24/15   Kathlen Mody, MD   BP 101/65 mmHg  Pulse 78  Temp(Src) 97.6 F (36.4 C) (Oral)  Resp 20  Ht  (1.803 m)  Wt 79.379 kg  BMI 24.42 kg/m2  SpO2  99% Physical Exam  Constitutional: He appears well-developed and well-nourished. No distress.  HENT:  Head: Atraumatic.  Eyes: Conjunctivae and EOM are normal. Pupils are equal, round, and reactive to light.  Right eye: Lids are everted, a 2 mm cystic papule noted to the right lateral canthus involving the lower inner eyelid without any erythema or drainage noted.  Normal conjunctiva and sclera. Pupils equal and reactive to light. Extra ocular movements intact. Lateral canthus is mildly tender to palpation. No skin erythema. Left eye: Normal appearance.  06:50:57 Visual Acuity JT Visual Acuity - Bilateral Distance: 20/30 ; R Distance: 20/50 ; L Distance: 20/40     Neck: Neck supple.  Neurological: He is alert.  Skin: No rash noted.  Psychiatric: He has a normal mood and affect.  Nursing note and vitals reviewed.   ED Course  Procedures (including critical care time) Labs Review Labs Reviewed - No data to display  Imaging Review No results found. I have personally reviewed and evaluated these images and lab results as part of my medical decision-making.   EKG Interpretation None      MDM   Final diagnoses:  Cyst of right lacrimal gland    BP 101/65 mmHg  Pulse 78  Temp(Src) 97.6 F (36.4 C) (Oral)  Resp 20  Ht  (1.803 m)  Wt 79.379 kg  BMI 24.42 kg/m2  SpO2 99%   7:00 AM Patient here complaining of right eye pain. He does have a cystic lesion to his right lower inner eyelid likely suggestive of a clogged lacrimal gland.  normal visual acuity. I recommend warm compress, close monitoring, and follow-up with ophthalmologist as needed. Return precaution discussed.  Fayrene Helper, PA-C 09/19/15 1610  April Palumbo, MD 09/19/15 [phone]

## 2015-09-19 NOTE — ED Notes (Signed)
Pt is complaining of right eye pain that started yesterday. Pt reports their is a large "puss pocket" in the inner eye lid. Reports clear eye drainage.

## 2015-09-19 NOTE — Discharge Instructions (Signed)
The cyst in your right eye is likely a clogged tear duct.  Please apply warm compress to the affected area several times daily and take over the counter pain medication as needed.  Follow up with eye specialist if you have any concerns.

## 2015-09-19 NOTE — ED Notes (Signed)
PA at bedside.

## 2015-09-20 DIAGNOSIS — R39198 Other difficulties with micturition: Secondary | ICD-10-CM | POA: Insufficient documentation

## 2015-09-20 DIAGNOSIS — Z880 Allergy status to penicillin: Secondary | ICD-10-CM | POA: Diagnosis not present

## 2015-09-20 DIAGNOSIS — I10 Essential (primary) hypertension: Secondary | ICD-10-CM | POA: Diagnosis not present

## 2015-09-20 DIAGNOSIS — Z87442 Personal history of urinary calculi: Secondary | ICD-10-CM | POA: Diagnosis not present

## 2015-09-20 DIAGNOSIS — F419 Anxiety disorder, unspecified: Secondary | ICD-10-CM | POA: Insufficient documentation

## 2015-09-20 DIAGNOSIS — Z7951 Long term (current) use of inhaled steroids: Secondary | ICD-10-CM | POA: Insufficient documentation

## 2015-09-20 DIAGNOSIS — Z79899 Other long term (current) drug therapy: Secondary | ICD-10-CM | POA: Insufficient documentation

## 2015-09-20 DIAGNOSIS — G8929 Other chronic pain: Secondary | ICD-10-CM | POA: Insufficient documentation

## 2015-09-20 DIAGNOSIS — R05 Cough: Secondary | ICD-10-CM | POA: Diagnosis present

## 2015-09-20 DIAGNOSIS — M545 Low back pain: Secondary | ICD-10-CM | POA: Insufficient documentation

## 2015-09-20 DIAGNOSIS — J441 Chronic obstructive pulmonary disease with (acute) exacerbation: Secondary | ICD-10-CM | POA: Insufficient documentation

## 2015-09-20 DIAGNOSIS — F17210 Nicotine dependence, cigarettes, uncomplicated: Secondary | ICD-10-CM | POA: Diagnosis not present

## 2015-09-21 ENCOUNTER — Emergency Department (HOSPITAL_COMMUNITY)
Admission: EM | Admit: 2015-09-21 | Discharge: 2015-09-21 | Disposition: A | Discharge status: Home / Self Care | Payer: Medicare Other | Attending: Emergency Medicine | Admitting: Emergency Medicine

## 2015-09-21 ENCOUNTER — Encounter (HOSPITAL_COMMUNITY): Payer: Self-pay | Admitting: Emergency Medicine

## 2015-09-21 ENCOUNTER — Emergency Department (HOSPITAL_COMMUNITY): Payer: Medicare Other

## 2015-09-21 DIAGNOSIS — J40 Bronchitis, not specified as acute or chronic: Secondary | ICD-10-CM

## 2015-09-21 DIAGNOSIS — J441 Chronic obstructive pulmonary disease with (acute) exacerbation: Secondary | ICD-10-CM | POA: Diagnosis not present

## 2015-09-21 LAB — URINALYSIS, ROUTINE W REFLEX MICROSCOPIC
BILIRUBIN URINE: NEGATIVE
Glucose, UA: NEGATIVE mg/dL
Hgb urine dipstick: NEGATIVE
Ketones, ur: NEGATIVE mg/dL
LEUKOCYTES UA: NEGATIVE
NITRITE: NEGATIVE
PROTEIN: NEGATIVE mg/dL
SPECIFIC GRAVITY, URINE: 1.018 (ref 1.005–1.030)
pH: 5 (ref 5.0–8.0)

## 2015-09-21 LAB — CBC
HEMATOCRIT: 35.4 % — AB (ref 39.0–52.0)
HEMOGLOBIN: 11.6 g/dL — AB (ref 13.0–17.0)
MCH: 32.3 pg (ref 26.0–34.0)
MCHC: 32.8 g/dL (ref 30.0–36.0)
MCV: 98.6 fL (ref 78.0–100.0)
Platelets: 246 10*3/uL (ref 150–400)
RBC: 3.59 MIL/uL — ABNORMAL LOW (ref 4.22–5.81)
RDW: 13.1 % (ref 11.5–15.5)
WBC: 12.9 10*3/uL — ABNORMAL HIGH (ref 4.0–10.5)

## 2015-09-21 LAB — BASIC METABOLIC PANEL
ANION GAP: 13 (ref 5–15)
BUN: 41 mg/dL — ABNORMAL HIGH (ref 6–20)
CHLORIDE: 100 mmol/L — AB (ref 101–111)
CO2: 26 mmol/L (ref 22–32)
CREATININE: 1.94 mg/dL — AB (ref 0.61–1.24)
Calcium: 9.6 mg/dL (ref 8.9–10.3)
GFR calc non Af Amer: 41 mL/min — ABNORMAL LOW (ref >60)
GFR, EST AFRICAN AMERICAN: 47 mL/min — AB (ref >60)
Glucose, Bld: 111 mg/dL — ABNORMAL HIGH (ref 65–99)
POTASSIUM: 4.5 mmol/L (ref 3.5–5.1)
Sodium: 139 mmol/L (ref 135–145)

## 2015-09-21 MED ORDER — ALBUTEROL SULFATE (2.5 MG/3ML) 0.083% IN NEBU
5.0000 mg | INHALATION_SOLUTION | Freq: Once | RESPIRATORY_TRACT | Status: AC
  Administered 2015-09-21: 5 mg via RESPIRATORY_TRACT
  Filled 2015-09-21: qty 6

## 2015-09-21 MED ORDER — ACETAMINOPHEN 325 MG PO TABS
650.0000 mg | ORAL_TABLET | Freq: Once | ORAL | Status: AC
  Administered 2015-09-21: 650 mg via ORAL
  Filled 2015-09-21: qty 2

## 2015-09-21 NOTE — ED Provider Notes (Signed)
CSN: [id number]     Arrival date & time 09/20/15  2354 History  By signing my name below, I, Sonum Patel, attest that this documentation has been prepared under the direction and in the presence of Azalia Bilis, MD. Electronically Signed: Sonum Patel, Neurosurgeon. 09/21/2015. 3:15 AM.    Chief Complaint  Patient presents with  . Cough  . Back Pain   The history is provided by the patient. No language interpreter was used.     HPI Comments: Eddie Williams is a 45 y.o. male with past medical history of COPD who presents to the Emergency Department complaining of 10 days of gradual onset, gradually worsening cough with associated chills and chest congestion. He has been using an albuterol inhaler QID for the past few days without significant relief. He is a daily smoker.   He also complains of difficulty urinating for the last few days. Patient states sometimes it takes 20 min to start a urine stream. He denies dysuria, frequency.   He also complains of lower back pain currently.   Past Medical History  Diagnosis Date  . Anxiety   . Chronic back pain   . Seizures (HCC)   . COPD (chronic obstructive pulmonary disease) (HCC)   . Hypertension   . Kidney stones   . Asthma    Past Surgical History  Procedure Laterality Date  . Back surgery    . Appendectomy    . Ankle surgery     Family History  Problem Relation Age of Onset  . CAD Other   . Cancer Other   . Diabetes Other   . Diabetes type II Mother   . Depression Mother   . CAD Father    Social History  Substance Use Topics  . Smoking status: Current Every Day Smoker -- 0.50 packs/day    Types: Cigarettes  . Smokeless tobacco: None  . Alcohol Use: Yes     Comment: Few drinks a month    Review of Systems  Constitutional: Positive for chills.  Respiratory: Positive for cough.        +chest congestion  Genitourinary: Positive for difficulty urinating. Negative for dysuria and frequency.  Musculoskeletal: Positive for back  pain.  All other systems reviewed and are negative.  Allergies  Penicillins; Ibuprofen; Nsaids; and Erythromycin  Home Medications   Prior to Admission medications   Medication Sig Start Date End Date Taking? Authorizing Provider  acetaminophen (TYLENOL) 500 MG tablet Take 1,000 mg by mouth every 6 (six) hours as needed for moderate pain.    Yes Historical Provider, MD  albuterol (PROVENTIL HFA;VENTOLIN HFA) 108 (90 BASE) MCG/ACT inhaler Inhale 2 puffs into the lungs every 6 (six) hours as needed for wheezing or shortness of breath.   Yes Historical Provider, MD  albuterol (PROVENTIL) (2.5 MG/3ML) 0.083% nebulizer solution Take 2.5 mg by nebulization every 6 (six) hours as needed for wheezing or shortness of breath.   Yes Historical Provider, MD  atenolol-chlorthalidone (TENORETIC) 100-25 MG per tablet Take 1 tablet by mouth daily. 04/04/15  Yes Historical Provider, MD  atorvastatin (LIPITOR) 10 MG tablet Take 10 mg by mouth daily. 08/31/15  Yes Historical Provider, MD  buPROPion (WELLBUTRIN XL) 300 MG 24 hr tablet Take 300 mg by mouth every morning.    Yes Historical Provider, MD  clonazePAM (KLONOPIN) 1 MG tablet Take 1 mg by mouth 4 (four) times daily as needed for anxiety.  09/14/15  Yes Historical Provider, MD  cyclobenzaprine (FLEXERIL) 10 MG tablet  Take 10 mg by mouth 2 (two) times daily as needed for muscle spasms.  08/26/15  Yes Historical Provider, MD  feeding supplement (BOOST / RESOURCE BREEZE) LIQD Take 1 Container by mouth 3 (three) times daily between meals. 05/24/15  Yes Kathlen Mody, MD  fluticasone (FLONASE) 50 MCG/ACT nasal spray Place 1-2 sprays into both nostrils daily as needed for allergies or rhinitis.    Yes Historical Provider, MD  folic acid (FOLVITE) 1 MG tablet Take 1 tablet (1 mg total) by mouth daily. 05/24/15  Yes Kathlen Mody, MD  gabapentin (NEURONTIN) 300 MG capsule Take 300 mg by mouth 3 (three) times daily. 09/19/15  Yes Historical Provider, MD   HYDROcodone-acetaminophen (NORCO) 10-325 MG tablet Take 1 tablet by mouth 3 (three) times daily as needed for moderate pain or severe pain.  09/06/15  Yes Historical Provider, MD  loratadine (CLARITIN) 10 MG tablet Take 10 mg by mouth every morning.    Yes Historical Provider, MD  Maltodextrin-Xanthan Gum (RESOURCE THICKENUP CLEAR) POWD As needed. 05/24/15  Yes Kathlen Mody, MD  omeprazole (PRILOSEC) 20 MG capsule Take 20 mg by mouth daily.   Yes Historical Provider, MD  sertraline (ZOLOFT) 100 MG tablet Take 100 mg by mouth every morning.    Yes Historical Provider, MD  SUBOXONE 8-2 MG FILM Place 1 Film under the tongue 2 (two) times daily. 09/15/15  Yes Historical Provider, MD  thiamine 100 MG tablet Take 1 tablet (100 mg total) by mouth daily. 05/24/15  Yes Kathlen Mody, MD  nicotine (NICODERM CQ - DOSED IN MG/24 HOURS) 21 mg/24hr patch Place 1 patch (21 mg total) onto the skin daily. Patient not taking: Reported on 09/21/2015 05/24/15   Kathlen Mody, MD  traMADol (ULTRAM) 50 MG tablet Take 2 tablets (100 mg total) by mouth every 6 (six) hours as needed for moderate pain. Patient not taking: Reported on 09/21/2015 05/24/15   Kathlen Mody, MD   BP 98/61 mmHg  Pulse 85  Temp(Src) 100 F (37.8 C) (Oral)  Resp 17  Ht 5\' 11"  (1.803 m)  Wt 175 lb (79.379 kg)  BMI 24.42 kg/m2  SpO2 94% Physical Exam  Constitutional: He is oriented to person, place, and time. He appears well-developed and well-nourished.  HENT:  Head: Normocephalic and atraumatic.  Eyes: EOM are normal.  Neck: Normal range of motion.  Cardiovascular: Normal rate, regular rhythm, normal heart sounds and intact distal pulses.   Pulmonary/Chest: Effort normal. No respiratory distress. He has wheezes (mild, bilaterally).  Abdominal: Soft. He exhibits no distension. There is no tenderness.  Musculoskeletal: Normal range of motion.  Neurological: He is alert and oriented to person, place, and time.  Skin: Skin is warm and dry.   Psychiatric: He has a normal mood and affect. Judgment normal.  Nursing note and vitals reviewed.   ED Course  Procedures (including critical care time)  DIAGNOSTIC STUDIES: Oxygen Saturation is 94% on RA, adequate by my interpretation.    COORDINATION OF CARE: 3:20 AM Discussed treatment plan with pt at bedside and pt agreed to plan.   Labs Review Labs Reviewed  CBC - Abnormal; Notable for the following:    WBC 12.9 (*)    RBC 3.59 (*)    Hemoglobin 11.6 (*)    HCT 35.4 (*)    All other components within normal limits  BASIC METABOLIC PANEL - Abnormal; Notable for the following:    Chloride 100 (*)    Glucose, Bld 111 (*)    BUN 41 (*)  Creatinine, Ser 1.94 (*)    GFR calc non Af Amer 41 (*)    GFR calc Af Amer 47 (*)    All other components within normal limits  URINALYSIS, ROUTINE W REFLEX MICROSCOPIC (NOT AT [facility])    Imaging Review Dg Chest 2 View  09/21/2015  CLINICAL DATA:  Nonproductive cough, shortness of breath for 1 week. Pneumonia September 2016. Smoker. History of COPD, hypertension, asthma. EXAM: CHEST  2 VIEW COMPARISON:  Chest radiograph August 11, 2015 FINDINGS: Cardiomediastinal silhouette is normal. Increased lung volumes can be seen with COPD. The lungs are clear without pleural effusions or focal consolidations. Trachea projects midline and there is no pneumothorax. Soft tissue planes and included osseous structures are non-suspicious. Very mild chronic wedging of single upper thoracic vertebral body. IMPRESSION: Hyperventilation without acute cardiopulmonary process. Electronically Signed   By: Awilda Metro M.D.   On: 09/21/2015 02:45   I have personally reviewed and evaluated these images and lab results as part of my medical decision-making.   EKG Interpretation None      MDM   Final diagnoses:  Bronchitis    Patient is overall well-appearing.  Likely bronchitis.  In regards to his urinary symptoms his urine doesn't show any signs of  infection.  Doubt prostatitis.  Discharge home in good condition.  Primary care follow-up.  Patient understands return to the ER for new or worsening symptoms  I personally performed the services described in this documentation, which was scribed in my presence. The recorded information has been reviewed and is accurate.       Azalia Bilis, MD 09/21/15 [phone]

## 2015-09-21 NOTE — ED Notes (Signed)
Patient presents for non productive cough, fever, lumbar pain x2 weeks.

## 2015-10-15 IMAGING — DX DG CHEST 1V PORT
1 series · 1 of 1 positions shown · non-contrast
Comparison: Prior film same day

CLINICAL DATA: Central line placement

EXAM:
PORTABLE CHEST - 1 VIEW

[chest ap]
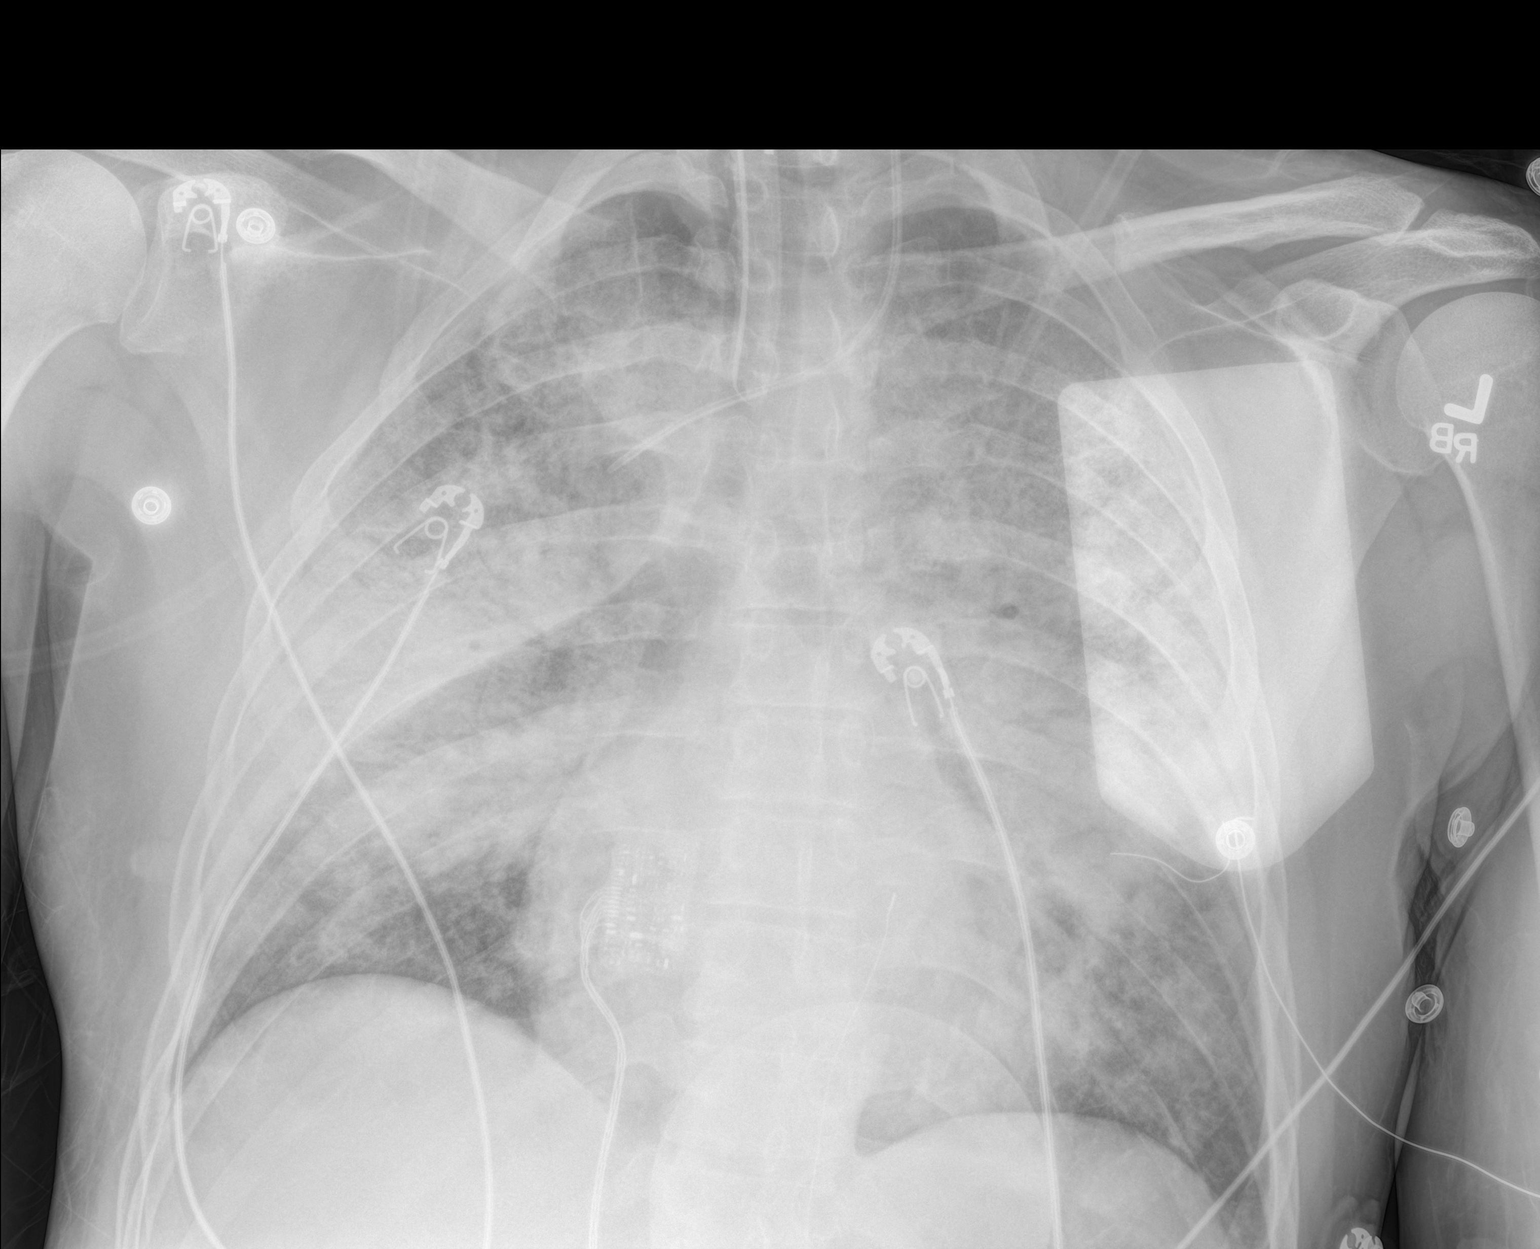

[1 of 1 positions shown; findings below may reference images not displayed]

FINDINGS: Bilateral extensive infiltrates again noted. Stable endotracheal
tube position. There is left IJ central line with tip in SVC. No
pneumothorax.
IMPRESSION: Extensive bilateral infiltrates again noted. Stable endotracheal
tube position. Left IJ central line with tip in SVC. No
pneumothorax.

## 2015-10-24 IMAGING — DX DG CHEST 1V PORT
1 series · 1 of 1 positions shown · non-contrast
Comparison: Portable chest x-ray May 15, 2015

CLINICAL DATA: Acute respiratory failure with septic shock, history
of asthma -COPD, current tobacco use

EXAM:
PORTABLE CHEST 1 VIEW

[chest ap]
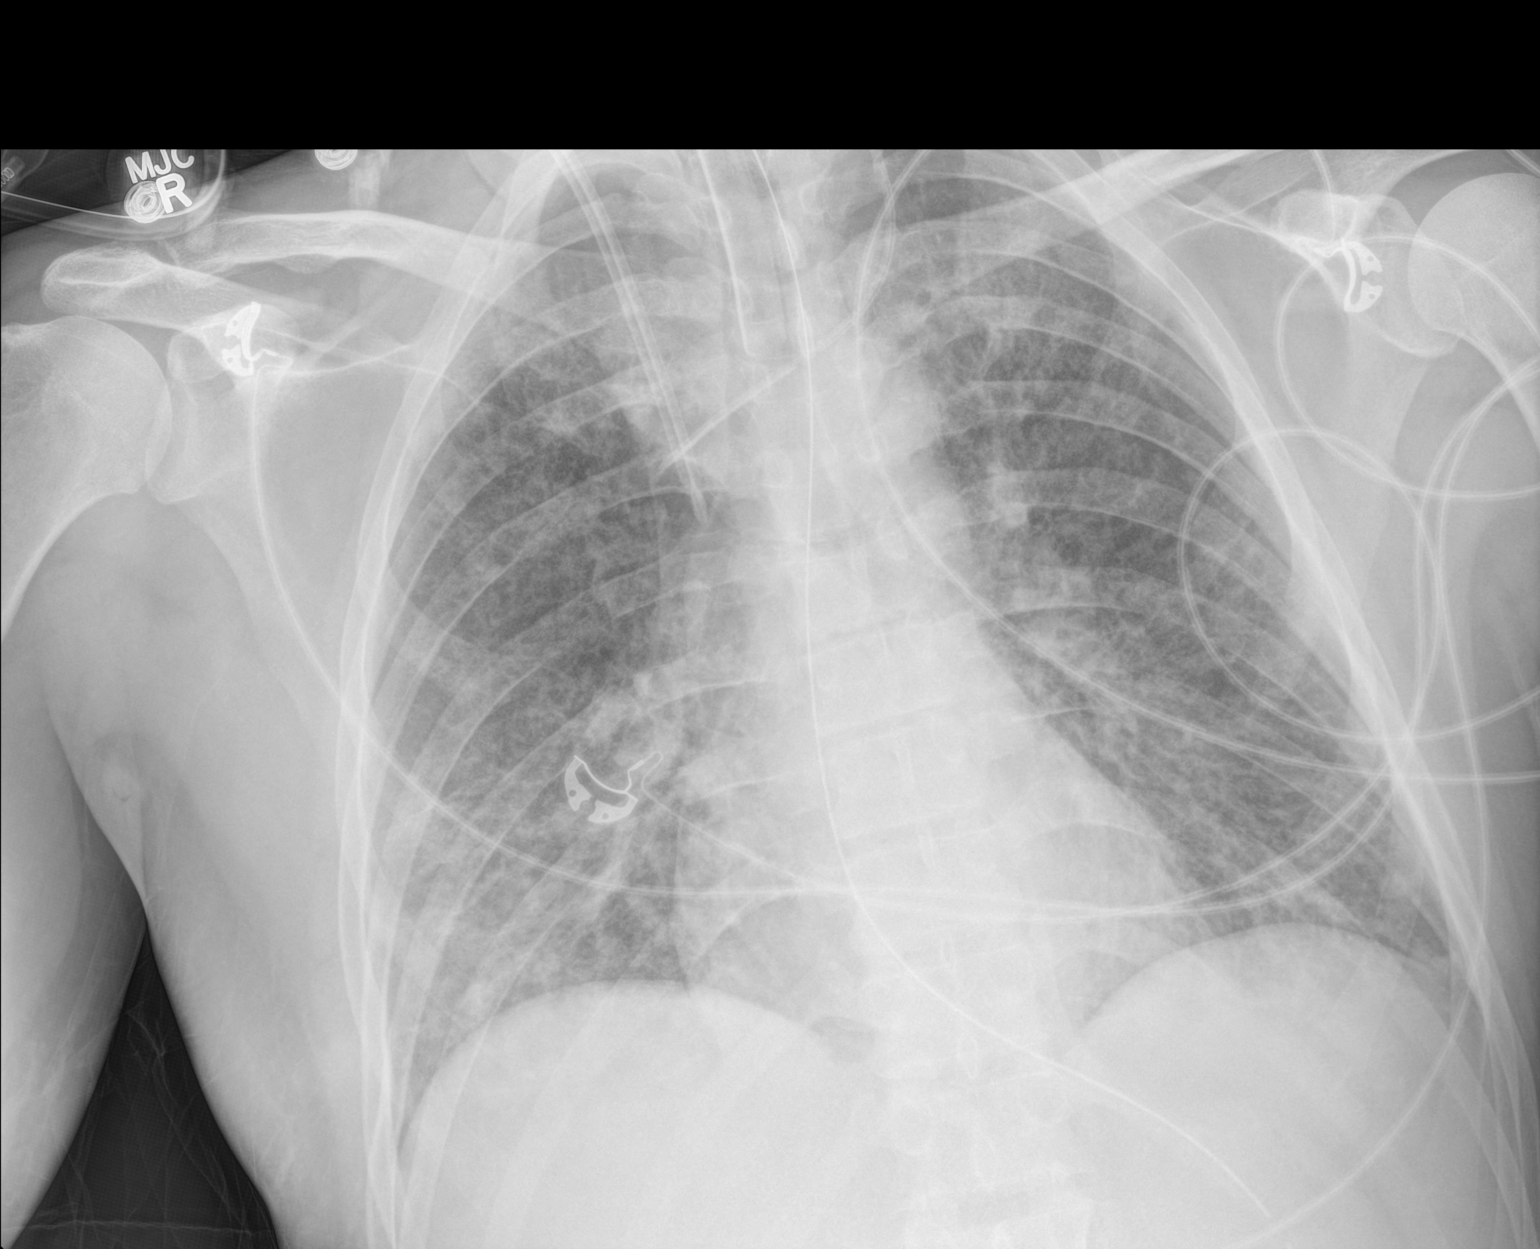

[1 of 1 positions shown; findings below may reference images not displayed]

FINDINGS: The lungs are adequately inflated. The interstitial markings remain
increased bilaterally. The cardiac silhouette is normal in size. The
pulmonary vascularity is not engorged. The mediastinum is normal in
width. There is no pleural effusion. The endotracheal tube tip lies
4.8 cm above the carina. The esophagogastric tube tip projects below
the inferior margin of the image. The you right internal jugular
dual-lumen catheter tip projects over the proximal SVC.
IMPRESSION: Persistent interstitial prominence consistent with interstitial
edema or pneumonia. No alveolar infiltrates are demonstrated.

## 2015-10-25 IMAGING — DX DG ABD PORTABLE 1V
1 series · 1 of 1 positions shown · non-contrast
Comparison: 05/09/2015.

CLINICAL DATA: 44-year-old male with constipation and abdominal
discomfort. Subsequent encounter.

EXAM:
PORTABLE ABDOMEN - 1 VIEW

[abdomen kub]
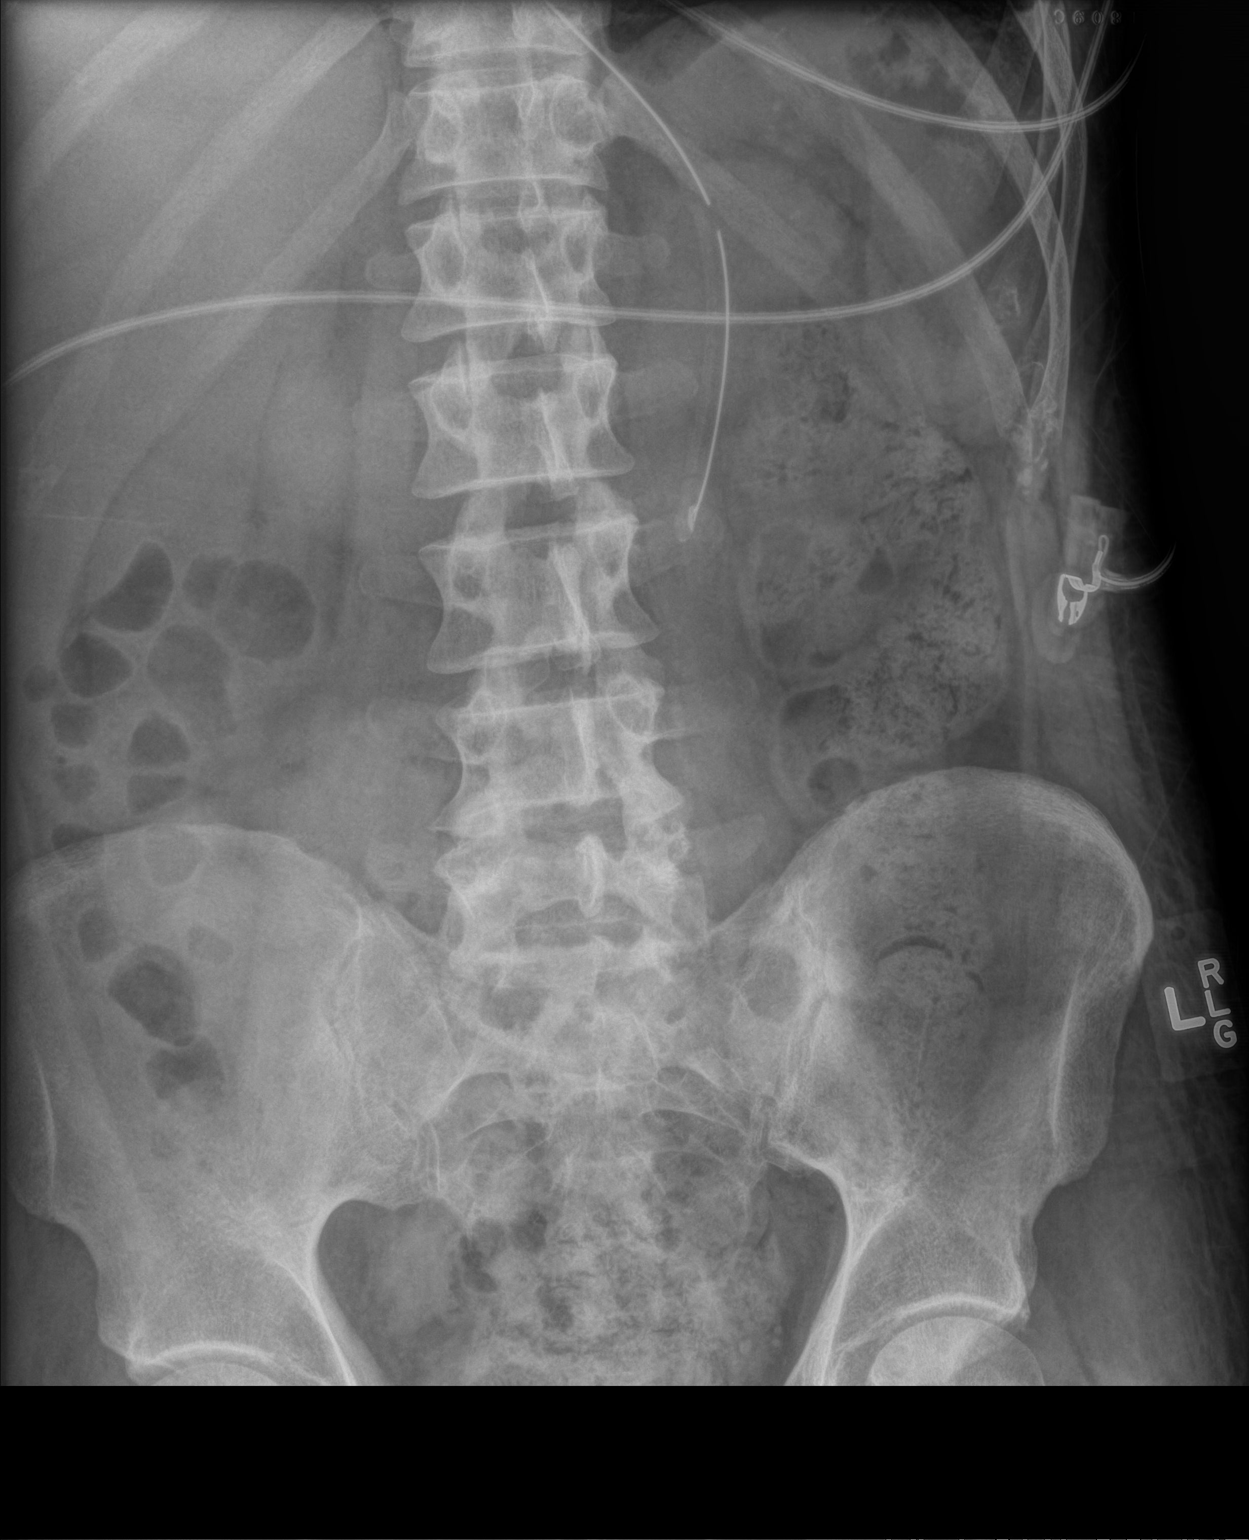

[1 of 1 positions shown; findings below may reference images not displayed]

FINDINGS: Nasogastric tube side all just beyond the gastroesophageal junction
with the tip at the expected level of the gastric body unchanged in
position from the prior exam.

Foley catheter in place.

Moderate stool throughout the descending colon with prominent amount
of stool rectosigmoid colon.

The possibility of free intraperitoneal air cannot be assessed on a
supine view.
IMPRESSION: Moderate stool throughout the descending colon with prominent amount
of stool rectosigmoid colon.

## 2015-10-26 IMAGING — CR DG CHEST 1V PORT
1 series · 1 of 1 positions shown · non-contrast
Comparison: 05/17/2015

CLINICAL DATA: ARDS

EXAM:
PORTABLE CHEST 1 VIEW

[AP]
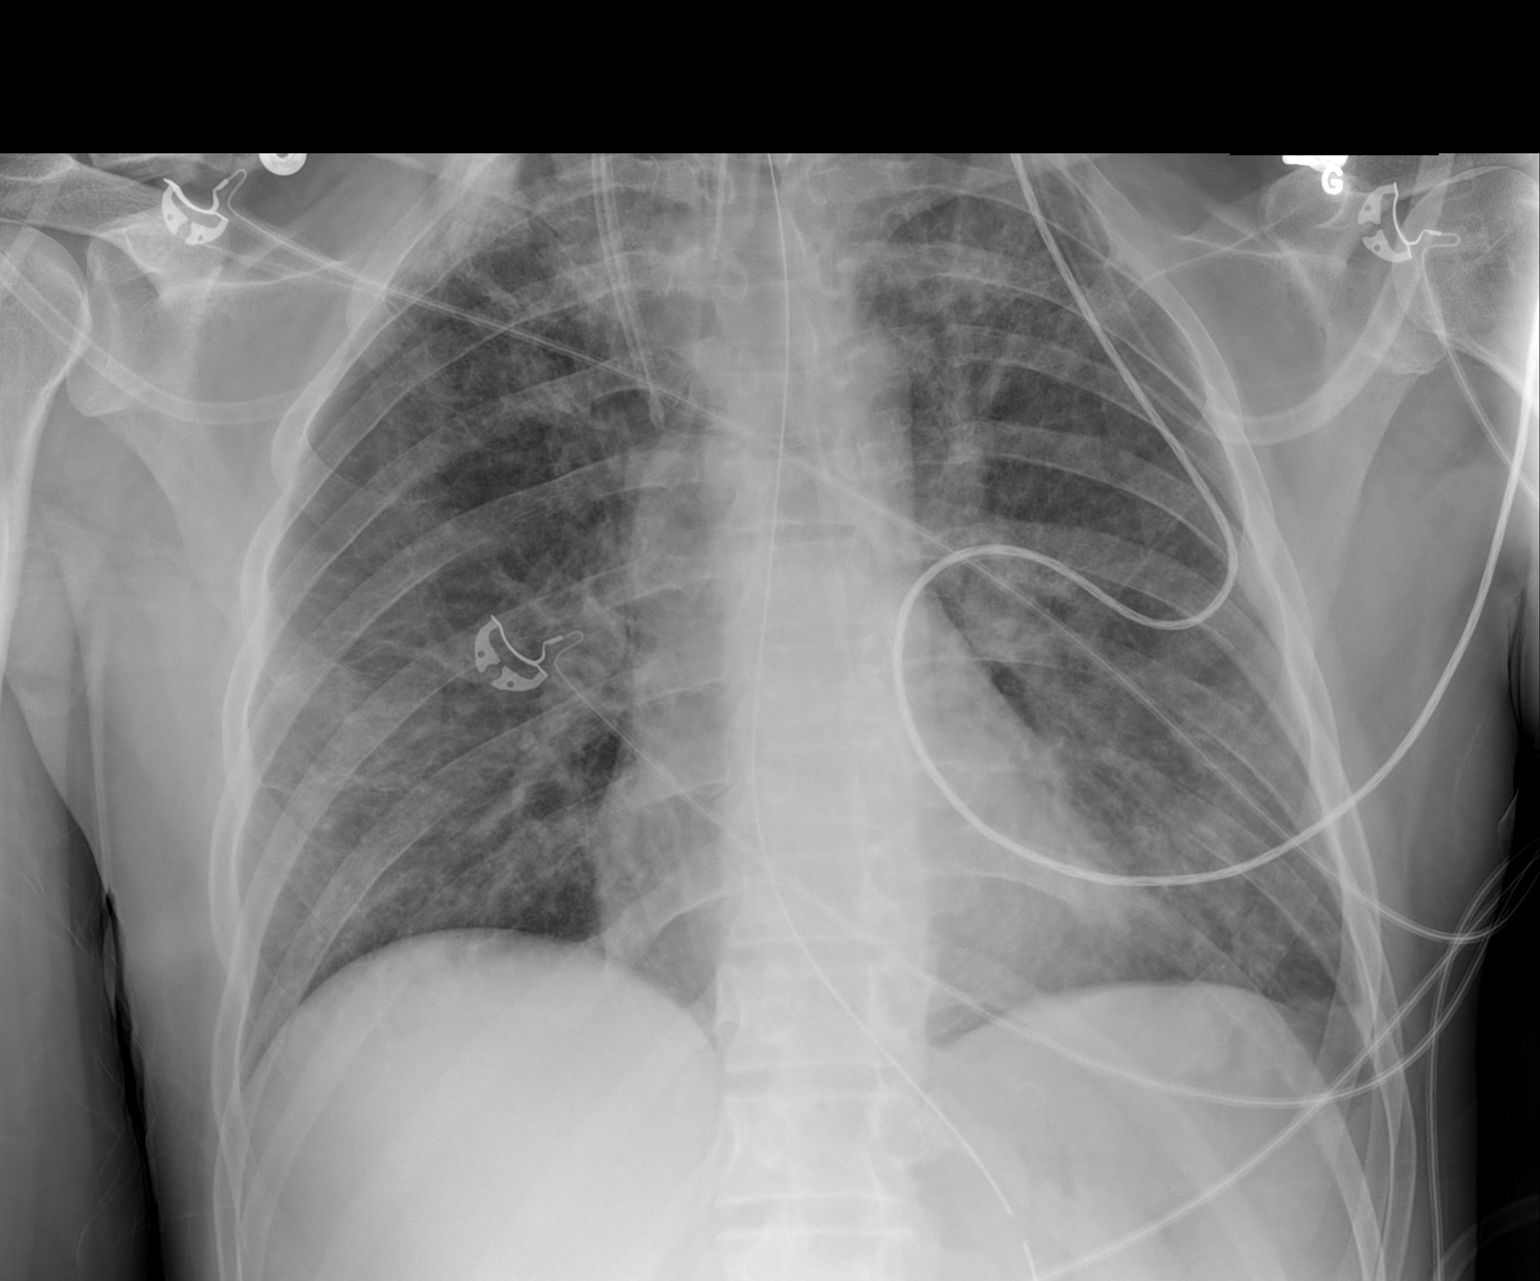

[1 of 1 positions shown; findings below may reference images not displayed]

FINDINGS: Support devices are stable. Heart is normal size. Stable
interstitial prominence and patchy airspace disease, most pronounced
in the upper lobes and left base. No effusions. No acute bony
abnormality. No real change.
IMPRESSION: Stable interstitial prominence and patchy bilateral airspace
disease, edema versus infection. No change.

## 2015-11-04 ENCOUNTER — Inpatient Hospital Stay (HOSPITAL_COMMUNITY)
Admission: EM | Admit: 2015-11-04 | Discharge: 2015-11-06 | DRG: 871 | Disposition: A | Discharge status: Home / Self Care | Payer: Medicare Other | Attending: Internal Medicine | Admitting: Internal Medicine

## 2015-11-04 ENCOUNTER — Encounter (HOSPITAL_COMMUNITY): Payer: Self-pay | Admitting: *Deleted

## 2015-11-04 ENCOUNTER — Emergency Department (HOSPITAL_COMMUNITY): Payer: Medicare Other

## 2015-11-04 DIAGNOSIS — E861 Hypovolemia: Secondary | ICD-10-CM | POA: Diagnosis present

## 2015-11-04 DIAGNOSIS — A419 Sepsis, unspecified organism: Secondary | ICD-10-CM | POA: Diagnosis not present

## 2015-11-04 DIAGNOSIS — Z881 Allergy status to other antibiotic agents status: Secondary | ICD-10-CM

## 2015-11-04 DIAGNOSIS — J9601 Acute respiratory failure with hypoxia: Secondary | ICD-10-CM | POA: Diagnosis not present

## 2015-11-04 DIAGNOSIS — N179 Acute kidney failure, unspecified: Secondary | ICD-10-CM | POA: Diagnosis present

## 2015-11-04 DIAGNOSIS — R6520 Severe sepsis without septic shock: Secondary | ICD-10-CM | POA: Diagnosis present

## 2015-11-04 DIAGNOSIS — Z880 Allergy status to penicillin: Secondary | ICD-10-CM

## 2015-11-04 DIAGNOSIS — J189 Pneumonia, unspecified organism: Secondary | ICD-10-CM | POA: Diagnosis present

## 2015-11-04 DIAGNOSIS — Z87442 Personal history of urinary calculi: Secondary | ICD-10-CM | POA: Diagnosis not present

## 2015-11-04 DIAGNOSIS — J440 Chronic obstructive pulmonary disease with acute lower respiratory infection: Secondary | ICD-10-CM | POA: Diagnosis present

## 2015-11-04 DIAGNOSIS — G9340 Encephalopathy, unspecified: Secondary | ICD-10-CM | POA: Diagnosis not present

## 2015-11-04 DIAGNOSIS — I10 Essential (primary) hypertension: Secondary | ICD-10-CM | POA: Diagnosis present

## 2015-11-04 DIAGNOSIS — G8929 Other chronic pain: Secondary | ICD-10-CM | POA: Diagnosis present

## 2015-11-04 DIAGNOSIS — Z79899 Other long term (current) drug therapy: Secondary | ICD-10-CM

## 2015-11-04 DIAGNOSIS — J45909 Unspecified asthma, uncomplicated: Secondary | ICD-10-CM | POA: Diagnosis present

## 2015-11-04 DIAGNOSIS — F17210 Nicotine dependence, cigarettes, uncomplicated: Secondary | ICD-10-CM | POA: Diagnosis present

## 2015-11-04 DIAGNOSIS — F419 Anxiety disorder, unspecified: Secondary | ICD-10-CM | POA: Diagnosis present

## 2015-11-04 DIAGNOSIS — F1910 Other psychoactive substance abuse, uncomplicated: Secondary | ICD-10-CM

## 2015-11-04 DIAGNOSIS — Z833 Family history of diabetes mellitus: Secondary | ICD-10-CM

## 2015-11-04 DIAGNOSIS — Z886 Allergy status to analgesic agent status: Secondary | ICD-10-CM

## 2015-11-04 DIAGNOSIS — I959 Hypotension, unspecified: Secondary | ICD-10-CM | POA: Diagnosis present

## 2015-11-04 DIAGNOSIS — Z818 Family history of other mental and behavioral disorders: Secondary | ICD-10-CM | POA: Diagnosis not present

## 2015-11-04 DIAGNOSIS — Z8249 Family history of ischemic heart disease and other diseases of the circulatory system: Secondary | ICD-10-CM

## 2015-11-04 DIAGNOSIS — M549 Dorsalgia, unspecified: Secondary | ICD-10-CM | POA: Diagnosis present

## 2015-11-04 HISTORY — DX: Bipolar disorder, unspecified: F31.9

## 2015-11-04 HISTORY — DX: Pneumonia, unspecified organism: J18.9

## 2015-11-04 HISTORY — DX: Headache, unspecified: R51.9

## 2015-11-04 HISTORY — DX: Gastro-esophageal reflux disease without esophagitis: K21.9

## 2015-11-04 HISTORY — DX: Headache: R51

## 2015-11-04 HISTORY — DX: Major depressive disorder, single episode, unspecified: F32.9

## 2015-11-04 HISTORY — DX: Depression, unspecified: F32.A

## 2015-11-04 HISTORY — DX: Acute kidney failure, unspecified: N17.9

## 2015-11-04 LAB — BASIC METABOLIC PANEL
Anion gap: 13 (ref 5–15)
BUN: 32 mg/dL — AB (ref 6–20)
CALCIUM: 9.1 mg/dL (ref 8.9–10.3)
CO2: 26 mmol/L (ref 22–32)
CREATININE: 2.06 mg/dL — AB (ref 0.61–1.24)
Chloride: 98 mmol/L — ABNORMAL LOW (ref 101–111)
GFR calc Af Amer: 44 mL/min — ABNORMAL LOW (ref >60)
GFR, EST NON AFRICAN AMERICAN: 38 mL/min — AB (ref >60)
GLUCOSE: 112 mg/dL — AB (ref 65–99)
POTASSIUM: 3.7 mmol/L (ref 3.5–5.1)
Sodium: 137 mmol/L (ref 135–145)

## 2015-11-04 LAB — CBC WITH DIFFERENTIAL/PLATELET
BASOS ABS: 0 10*3/uL (ref 0.0–0.1)
BASOS PCT: 0 %
EOS ABS: 0.2 10*3/uL (ref 0.0–0.7)
EOS PCT: 1 %
HEMATOCRIT: 30.7 % — AB (ref 39.0–52.0)
Hemoglobin: 10.6 g/dL — ABNORMAL LOW (ref 13.0–17.0)
Lymphocytes Relative: 15 %
Lymphs Abs: 2.3 10*3/uL (ref 0.7–4.0)
MCH: 31.7 pg (ref 26.0–34.0)
MCHC: 34.5 g/dL (ref 30.0–36.0)
MCV: 91.9 fL (ref 78.0–100.0)
MONO ABS: 0.8 10*3/uL (ref 0.1–1.0)
MONOS PCT: 5 %
NEUTROS ABS: 12.3 10*3/uL — AB (ref 1.7–7.7)
Neutrophils Relative %: 79 %
PLATELETS: 219 10*3/uL (ref 150–400)
RBC: 3.34 MIL/uL — ABNORMAL LOW (ref 4.22–5.81)
RDW: 13 % (ref 11.5–15.5)
WBC: 15.6 10*3/uL — ABNORMAL HIGH (ref 4.0–10.5)

## 2015-11-04 LAB — CREATININE, SERUM
Creatinine, Ser: 1.41 mg/dL — ABNORMAL HIGH (ref 0.61–1.24)
GFR calc non Af Amer: 60 mL/min — ABNORMAL LOW (ref >60)

## 2015-11-04 LAB — RAPID URINE DRUG SCREEN, HOSP PERFORMED
Amphetamines: POSITIVE — AB
BENZODIAZEPINES: POSITIVE — AB
Barbiturates: NOT DETECTED
COCAINE: NOT DETECTED
OPIATES: POSITIVE — AB
TETRAHYDROCANNABINOL: NOT DETECTED

## 2015-11-04 LAB — URINALYSIS, ROUTINE W REFLEX MICROSCOPIC
BILIRUBIN URINE: NEGATIVE
GLUCOSE, UA: NEGATIVE mg/dL
HGB URINE DIPSTICK: NEGATIVE
KETONES UR: NEGATIVE mg/dL
Leukocytes, UA: NEGATIVE
Nitrite: NEGATIVE
PROTEIN: NEGATIVE mg/dL
Specific Gravity, Urine: 1.012 (ref 1.005–1.030)
pH: 6 (ref 5.0–8.0)

## 2015-11-04 LAB — I-STAT CG4 LACTIC ACID, ED
LACTIC ACID, VENOUS: 1.23 mmol/L (ref 0.5–2.0)
Lactic Acid, Venous: 0.85 mmol/L (ref 0.5–2.0)

## 2015-11-04 LAB — PROCALCITONIN: Procalcitonin: 0.11 ng/mL

## 2015-11-04 MED ORDER — SODIUM CHLORIDE 0.9 % IV SOLN
1000.0000 mL | INTRAVENOUS | Status: DC
  Administered 2015-11-04 (×2): 1000 mL via INTRAVENOUS

## 2015-11-04 MED ORDER — DEXTROSE 5 % IV SOLN
1.0000 g | Freq: Three times a day (TID) | INTRAVENOUS | Status: DC
  Filled 2015-11-04: qty 1

## 2015-11-04 MED ORDER — SODIUM CHLORIDE 0.9 % IV BOLUS (SEPSIS)
1000.0000 mL | Freq: Once | INTRAVENOUS | Status: AC
  Administered 2015-11-04: 1000 mL via INTRAVENOUS

## 2015-11-04 MED ORDER — SODIUM CHLORIDE 0.9% FLUSH
3.0000 mL | Freq: Two times a day (BID) | INTRAVENOUS | Status: DC
  Administered 2015-11-04 – 2015-11-05 (×2): 3 mL via INTRAVENOUS

## 2015-11-04 MED ORDER — SODIUM CHLORIDE 0.9% FLUSH
3.0000 mL | Freq: Two times a day (BID) | INTRAVENOUS | Status: DC
  Administered 2015-11-04 – 2015-11-05 (×3): 3 mL via INTRAVENOUS

## 2015-11-04 MED ORDER — OXYCODONE HCL 5 MG PO TABS
5.0000 mg | ORAL_TABLET | ORAL | Status: DC | PRN
Start: 2015-11-04 — End: 2015-11-06
  Administered 2015-11-04 – 2015-11-06 (×6): 5 mg via ORAL
  Filled 2015-11-04 (×6): qty 1

## 2015-11-04 MED ORDER — VANCOMYCIN HCL IN DEXTROSE 750-5 MG/150ML-% IV SOLN
750.0000 mg | Freq: Two times a day (BID) | INTRAVENOUS | Status: DC
  Administered 2015-11-05: 750 mg via INTRAVENOUS
  Filled 2015-11-04: qty 150

## 2015-11-04 MED ORDER — SODIUM CHLORIDE 0.9 % IV BOLUS (SEPSIS)
500.0000 mL | INTRAVENOUS | Status: AC
  Administered 2015-11-04: 500 mL via INTRAVENOUS

## 2015-11-04 MED ORDER — LEVOFLOXACIN IN D5W 750 MG/150ML IV SOLN: 750.0000 mg | INTRAVENOUS | Status: DC

## 2015-11-04 MED ORDER — DEXTROSE 5 % IV SOLN
2.0000 g | Freq: Once | INTRAVENOUS | Status: AC
  Administered 2015-11-04: 2 g via INTRAVENOUS
  Filled 2015-11-04: qty 2

## 2015-11-04 MED ORDER — DEXTROSE 5 % IV SOLN: 2.0000 g | Freq: Once | INTRAVENOUS | Status: DC

## 2015-11-04 MED ORDER — ONDANSETRON HCL 4 MG PO TABS: 4.0000 mg | ORAL_TABLET | Freq: Four times a day (QID) | ORAL | Status: DC | PRN

## 2015-11-04 MED ORDER — BUPROPION HCL ER (XL) 300 MG PO TB24
300.0000 mg | ORAL_TABLET | Freq: Every morning | ORAL | Status: DC
  Administered 2015-11-05 – 2015-11-06 (×2): 300 mg via ORAL
  Filled 2015-11-04 (×2): qty 1

## 2015-11-04 MED ORDER — SODIUM CHLORIDE 0.9 % IV SOLN: INTRAVENOUS | Status: DC

## 2015-11-04 MED ORDER — VANCOMYCIN HCL IN DEXTROSE 1-5 GM/200ML-% IV SOLN
1000.0000 mg | Freq: Once | INTRAVENOUS | Status: AC
  Administered 2015-11-04: 1000 mg via INTRAVENOUS
  Filled 2015-11-04: qty 200

## 2015-11-04 MED ORDER — MORPHINE SULFATE (PF) 2 MG/ML IV SOLN
2.0000 mg | INTRAVENOUS | Status: DC | PRN
  Administered 2015-11-05 – 2015-11-06 (×7): 2 mg via INTRAVENOUS
  Filled 2015-11-04 (×7): qty 1

## 2015-11-04 MED ORDER — NICOTINE 21 MG/24HR TD PT24
21.0000 mg | MEDICATED_PATCH | Freq: Every day | TRANSDERMAL | Status: DC
  Administered 2015-11-04 – 2015-11-05 (×2): 21 mg via TRANSDERMAL
  Filled 2015-11-04 (×3): qty 1

## 2015-11-04 MED ORDER — SODIUM CHLORIDE 0.9 % IV BOLUS (SEPSIS)
1000.0000 mL | INTRAVENOUS | Status: AC
  Administered 2015-11-04 (×2): 1000 mL via INTRAVENOUS

## 2015-11-04 MED ORDER — SODIUM CHLORIDE 0.9 % IV SOLN
1000.0000 mL | Freq: Once | INTRAVENOUS | Status: AC
  Administered 2015-11-04: 1000 mL via INTRAVENOUS

## 2015-11-04 MED ORDER — LEVOFLOXACIN IN D5W 750 MG/150ML IV SOLN
750.0000 mg | INTRAVENOUS | Status: DC
  Administered 2015-11-05: 750 mg via INTRAVENOUS
  Filled 2015-11-04 (×2): qty 150

## 2015-11-04 MED ORDER — SERTRALINE HCL 100 MG PO TABS
100.0000 mg | ORAL_TABLET | Freq: Every morning | ORAL | Status: DC
  Administered 2015-11-05 – 2015-11-06 (×2): 100 mg via ORAL
  Filled 2015-11-04 (×2): qty 1

## 2015-11-04 MED ORDER — LEVOFLOXACIN IN D5W 750 MG/150ML IV SOLN
750.0000 mg | Freq: Once | INTRAVENOUS | Status: AC
Start: 2015-11-04 — End: 2015-11-04
  Administered 2015-11-04: 750 mg via INTRAVENOUS
  Filled 2015-11-04: qty 150

## 2015-11-04 MED ORDER — SODIUM CHLORIDE 0.9 % IV SOLN
INTRAVENOUS | Status: DC
  Administered 2015-11-04 – 2015-11-06 (×3): via INTRAVENOUS

## 2015-11-04 MED ORDER — DEXTROSE 5 % IV SOLN
2.0000 g | Freq: Three times a day (TID) | INTRAVENOUS | Status: DC
  Administered 2015-11-05 (×4): 2 g via INTRAVENOUS
  Filled 2015-11-04 (×7): qty 2

## 2015-11-04 MED ORDER — LEVOFLOXACIN IN D5W 750 MG/150ML IV SOLN
750.0000 mg | Freq: Once | INTRAVENOUS | Status: DC
  Filled 2015-11-04: qty 150

## 2015-11-04 MED ORDER — ONDANSETRON HCL 4 MG/2ML IJ SOLN: 4.0000 mg | Freq: Four times a day (QID) | INTRAMUSCULAR | Status: DC | PRN

## 2015-11-04 MED ORDER — SODIUM CHLORIDE 0.9% FLUSH: 3.0000 mL | Freq: Two times a day (BID) | INTRAVENOUS | Status: DC

## 2015-11-04 MED ORDER — ENOXAPARIN SODIUM 40 MG/0.4ML ~~LOC~~ SOLN
40.0000 mg | Freq: Every day | SUBCUTANEOUS | Status: DC
  Administered 2015-11-04 – 2015-11-05 (×2): 40 mg via SUBCUTANEOUS
  Filled 2015-11-04 (×2): qty 0.4

## 2015-11-04 MED ORDER — CLONAZEPAM 1 MG PO TABS
2.0000 mg | ORAL_TABLET | Freq: Three times a day (TID) | ORAL | Status: DC | PRN
  Administered 2015-11-05 – 2015-11-06 (×4): 2 mg via ORAL
  Filled 2015-11-04 (×4): qty 2

## 2015-11-04 NOTE — H&P (Signed)
[facility] History and Physical  Eddie Williams:[id number] DOB: 28-Mar-1971 DOA: 11/04/2015  Referring physician:  PCP: [facility]   Chief Complaint: Mental status changes  HPI: Eddie Williams is a 45 y.o. male with a past medical history of polysubstance abuse, tobacco abuse, hypertension, evident history of septic shock in setting of healthcare associated pneumonia hospitalized from 05/07/2015 through 05/24/2015, presented to the [facility] department with complaints of  mental status changes. He states feeling ill over the past several days having subjective fevers, chills, cough was associated with scant sputum production, retrosternal chest pain precipitated by cough and deep inspiration, generalized weakness, malaise. He denies recent travels or sick contacts The emergency department he was found to be hypotensive having a blood pressure of 74/46. Chest x-ray revealed hazy bibasilar airspace disease concerning for pneumonia and was started on broad-spectrum IV antimicrobial therapy in the emergency department with IV vancomycin, aztreonam and Levaquin. Code sepsis was called.                                   Review of Systems:  Constitutional:  No weight loss, night sweats, positive for fevers, chills, fatigue.  HEENT:  No headaches, Difficulty swallowing,Tooth/dental problems,Sore throat,  No sneezing, itching, ear ache, nasal congestion, post nasal drip,  Cardio-vascular:  Positive for chest pain, denies Orthopnea, PND, swelling in lower extremities, anasarca, dizziness, palpitations  GI:  No heartburn, indigestion, abdominal pain, nausea, vomiting, diarrhea, change in bowel habits, loss of appetite  Resp:  No shortness of breath with exertion or at rest. No excess mucus, no productive cough, No non-productive cough, No coughing up of blood.No change in color of mucus.No wheezing.No chest wall deformity  Skin:  no rash or lesions.  GU:  no dysuria, change in  color of urine, no urgency or frequency. No flank pain.  Musculoskeletal:  No joint pain or swelling. No decreased range of motion. No back pain.  Psych:  No change in mood or affect. No depression or anxiety. No memory loss.   Past Medical History  Diagnosis Date  . Anxiety   . Chronic back pain   . Seizures (HCC)   . COPD (chronic obstructive pulmonary disease) (HCC)   . Hypertension   . Asthma   . Kidney stones   . Acute kidney failure [facility])    Past Surgical History  Procedure Laterality Date  . Back surgery    . Appendectomy    . Ankle surgery     Social History:  reports that he has been smoking Cigarettes.  He has been smoking about 0.50 packs per day. He does not have any smokeless tobacco history on file. He reports that he drinks alcohol. He reports that he uses illicit drugs ("Crack" cocaine and Marijuana).  Allergies  Allergen Reactions  . Penicillins Anaphylaxis    Has patient had a PCN reaction causing immediate rash, facial/tongue/throat swelling, SOB or lightheadedness with hypotension: Yes Has patient had a PCN reaction causing severe rash involving mucus membranes or skin necrosis: No Has patient had a PCN reaction that required hospitalization: Yes Has patient had a PCN reaction occurring within the last 10 years: No If all of the above answers are "NO", then may proceed with Cephalosporin use.   . Ibuprofen Nausea Only  . Nsaids Nausea And Vomiting  . Erythromycin Rash    Family History  Problem Relation Age of Onset  .  CAD Other   . Cancer Other   . Diabetes Other   . Diabetes type II Mother   . Depression Mother   . CAD Father     Prior to Admission medications   Medication Sig Start Date End Date Taking? Authorizing Provider  acetaminophen (TYLENOL) 500 MG tablet Take 1,000 mg by mouth every 6 (six) hours as needed for moderate pain.    Yes Historical Provider, MD  albuterol (PROVENTIL HFA;VENTOLIN HFA) 108 (90 BASE) MCG/ACT inhaler Inhale 2  puffs into the lungs every 6 (six) hours as needed for wheezing or shortness of breath.   Yes Historical Provider, MD  atenolol-chlorthalidone (TENORETIC) 100-25 MG per tablet Take 1 tablet by mouth daily. 04/04/15  Yes Historical Provider, MD  atorvastatin (LIPITOR) 10 MG tablet Take 10 mg by mouth daily. 08/31/15  Yes Historical Provider, MD  buPROPion (WELLBUTRIN XL) 300 MG 24 hr tablet Take 300 mg by mouth every morning.    Yes Historical Provider, MD  clonazePAM (KLONOPIN) 2 MG tablet Take 2 mg by mouth 3 (three) times daily.  10/27/15  Yes Historical Provider, MD  diphenhydrAMINE (BENADRYL) 25 MG tablet Take 50 mg by mouth every 6 (six) hours as needed for allergies.   Yes Historical Provider, MD  gabapentin (NEURONTIN) 300 MG capsule Take 300 mg by mouth 3 (three) times daily. 09/19/15  Yes Historical Provider, MD  HYDROcodone-acetaminophen (NORCO) 10-325 MG tablet Take 1 tablet by mouth 3 (three) times daily as needed for moderate pain or severe pain.  09/06/15  Yes Historical Provider, MD  lisinopril-hydrochlorothiazide (PRINZIDE,ZESTORETIC) 10-12.5 MG tablet TK 1 T PO QD 10/04/15  Yes Historical Provider, MD  loratadine (CLARITIN) 10 MG tablet Take 10 mg by mouth every morning.    Yes Historical Provider, MD  omeprazole (PRILOSEC) 20 MG capsule Take 20 mg by mouth daily.   Yes Historical Provider, MD  sertraline (ZOLOFT) 100 MG tablet Take 100 mg by mouth every morning.    Yes Historical Provider, MD  SUBOXONE 8-2 MG FILM Place 1 Film under the tongue 2 (two) times daily. 09/15/15  Yes Historical Provider, MD  albuterol (PROVENTIL) (2.5 MG/3ML) 0.083% nebulizer solution Take 2.5 mg by nebulization every 6 (six) hours as needed for wheezing or shortness of breath.    Historical Provider, MD  feeding supplement (BOOST / RESOURCE BREEZE) LIQD Take 1 Container by mouth 3 (three) times daily between meals. Patient not taking: Reported on 11/04/2015 05/24/15   Kathlen Mody, MD  fluticasone (FLONASE) 50  MCG/ACT nasal spray Place 1-2 sprays into both nostrils daily as needed for allergies or rhinitis.     Historical Provider, MD  folic acid (FOLVITE) 1 MG tablet Take 1 tablet (1 mg total) by mouth daily. Patient not taking: Reported on 11/04/2015 05/24/15   Kathlen Mody, MD  Maltodextrin-Xanthan Gum (RESOURCE THICKENUP CLEAR) POWD As needed. Patient not taking: Reported on 11/04/2015 05/24/15   Kathlen Mody, MD  nicotine (NICODERM CQ - DOSED IN MG/24 HOURS) 21 mg/24hr patch Place 1 patch (21 mg total) onto the skin daily. Patient not taking: Reported on 09/21/2015 05/24/15   Kathlen Mody, MD  thiamine 100 MG tablet Take 1 tablet (100 mg total) by mouth daily. Patient not taking: Reported on 11/04/2015 05/24/15   Kathlen Mody, MD  traMADol (ULTRAM) 50 MG tablet Take 2 tablets (100 mg total) by mouth every 6 (six) hours as needed for moderate pain. Patient not taking: Reported on 09/21/2015 05/24/15   Kathlen Mody, MD  Physical Exam: Filed Vitals:   11/04/15 1539 11/04/15 1600 11/04/15 1615 11/04/15 1630  BP: 74/46 83/46  87/64  Pulse:  59 64   Temp:  97.3 F (36.3 C) 97.3 F (36.3 C)   TempSrc:      Resp:  21 23   Height:      Weight:      SpO2:  80% 100%     Wt Readings from Last 3 Encounters:  11/04/15 78.019 kg (172 lb)  09/21/15 79.379 kg (175 lb)  09/19/15 79.379 kg (175 lb)    General:  Ill-appearing, looks profoundly dry, he is awake and alert and can provide history for me. Eyes: PERRL, normal lids, irises & conjunctiva ENT: grossly normal hearing, lips & tongue, dry oral mucosa Neck: no LAD, masses or thyromegaly Cardiovascular: Tachycardic, RRR, no m/r/g. No LE edema. Telemetry: SR, no arrhythmias  Respiratory: He has by lateral rhonchi, crackles, expiratory wheezes. Coarse upper respiratory sounds. Abdomen: soft, ntnd Skin: no rash or induration seen on limited exam Musculoskeletal: grossly normal tone BUE/BLE Psychiatric: He appears lethargic however is awake and alert and  can provide history. Neurologic: grossly non-focal.          Labs on Admission:  Basic Metabolic Panel:  Recent Labs Lab 11/04/15 1131  NA 137  K 3.7  CL 98*  CO2 26  GLUCOSE 112*  BUN 32*  CREATININE 2.06*  CALCIUM 9.1   Liver Function Tests: No results for input(s): AST, ALT, ALKPHOS, BILITOT, PROT, ALBUMIN in the last 168 hours. No results for input(s): LIPASE, AMYLASE in the last 168 hours. No results for input(s): AMMONIA in the last 168 hours. CBC:  Recent Labs Lab 11/04/15 1134  WBC 15.6*  NEUTROABS 12.3*  HGB 10.6*  HCT 30.7*  MCV 91.9  PLT 219   Cardiac Enzymes: No results for input(s): CKTOTAL, CKMB, CKMBINDEX, TROPONINI in the last 168 hours.  BNP (last 3 results) No results for input(s): BNP in the last 8760 hours.  ProBNP (last 3 results) No results for input(s): PROBNP in the last 8760 hours.  CBG: No results for input(s): GLUCAP in the last 168 hours.  Radiological Exams on Admission: Dg Chest 1 View  11/04/2015  CLINICAL DATA:  Sepsis EXAM: CHEST 1 VIEW COMPARISON:  09/21/2015 FINDINGS: Normal heart size. Hazy and central bibasilar airspace disease. Upper lungs clear. No pneumothorax. No pleural effusion. IMPRESSION: Hazy bibasilar airspace disease. Followup PA and lateral chest X-ray is recommended in 3-4 weeks following trial of antibiotic therapy to ensure resolution and exclude underlying malignancy. Electronically Signed   By: Jolaine Click M.D.   On: 11/04/2015 13:18    EKG: Independently reviewed.   Assessment/Plan Principal Problem:   Sepsis (HCC) Active Problems:   Acute renal failure (HCC)   CAP (community acquired pneumonia)   Polysubstance abuse   Acute encephalopathy   Acute hypoxemic respiratory failure (HCC)   1. Severe sepsis. Evidence by presenting blood pressure of 70/46, O2 sat of 80%, white count of 15,600, acute kidney injury, acute encephalopathy, with a source of infection possibly coming from community-acquired  pneumonia. Will place him on the sepsis protocol, provide aggressive IV fluid resuscitation with normal saline, continue broad-spectrum IV antibiotic therapy with vancomycin, aztreonam, Levaquin. Follow-up on blood cultures obtained in the emergency department. He'll be closely monitored in the stepdown unit.  2. Suspected community acquired pneumonia. He presents with complaints of subjective fevers, chills, having sepsis criteria. Chest x-ray performed in the emergency department showed possible bibasilar  opacities which could reflect pneumonia. He denies sick contacts. Will check a influenza swab. Place him on sepsis protocol, provide aggressive IV fluid resuscitation as well as average with broad-spectrum IV antimicrobial therapy overnight. Plan to repeat chest x-ray in a.m. 3. Acute kidney injury. Suspect secondary to ATN as a consequence of hypotension in setting of severe sepsis. He presented with systolic blood pressures in the 70s. Lab work revealed creatinine of 206 with BUN of 32. We will continue to provide aggressive IV fluid resuscitation in the step down unit. Monitor input and output, repeat labs in a.m. 4. History of hypertension. Lisinopril, atenolol and hydrochlorothiazide will be discontinued due to hypotension. 5. Acute hypoxemic respiratory failure. Evidence by an O2 sat of 80% in the emergency department. Suspect may be reflective of community-acquired pneumonia. Provide supplemental oxygen as needed, supportive care, IV and microbial therapy. 6. Tobacco abuse. Provide nicotine patch.  Code Status: Full code DVT Prophylaxis: Lovenox Family Communication: Family not present Disposition Plan: Will admit patient to step down unit, anticipate he may require greater than 2 nights hospitalization  Time spent: 70 min  Jeralyn BennettZAMORA, Eiley Mcginnity [facility] Pager [phone]

## 2015-11-04 NOTE — ED Provider Notes (Signed)
CSN: [id number]     Arrival date & time 11/04/15  1036 History   First MD Initiated Contact with Patient 11/04/15 1121     Chief Complaint  Patient presents with  . Altered Mental Status  . Hypotension     (Consider location/radiation/quality/duration/timing/severity/associated sxs/prior Treatment) HPI Patient reports that he has a history of an episode of being very ill with kidney failure and he was hospitalized in 05/2015 for serious infection problems with his kidneys. He reports he's concerned that he may have developed a urinary tract infection or kidney failure again. He reports he's putting out very little urine and has to push hard just to get a few drops out. He also reports he's having a lot of generalized pain. He reports pain in his back and aching throughout. He states he's felt kind of confused and very weak. Past Medical History  Diagnosis Date  . Anxiety   . Chronic back pain   . Seizures (HCC)   . COPD (chronic obstructive pulmonary disease) (HCC)   . Hypertension   . Asthma   . Kidney stones   . Acute kidney failure (HCC)   . Pneumonia   . Depression   . Bipolar disorder (HCC)   . GERD (gastroesophageal reflux disease)   . Headache    Past Surgical History  Procedure Laterality Date  . Back surgery    . Appendectomy    . Ankle surgery     Family History  Problem Relation Age of Onset  . CAD Other   . Cancer Other   . Diabetes Other   . Diabetes type II Mother   . Depression Mother   . CAD Father   . Diabetes type II Father   . Multiple sclerosis Brother   . Atrial fibrillation Brother    Social History  Substance Use Topics  . Smoking status: Current Every Day Smoker -- 0.50 packs/day    Types: Cigarettes  . Smokeless tobacco: None  . Alcohol Use: Yes     Comment: Few drinks a month    Review of Systems 10 Systems reviewed and are negative for acute change except as noted in the HPI.    Allergies  Penicillins; Ibuprofen; Nsaids; and  Erythromycin  Home Medications   Prior to Admission medications   Medication Sig Start Date End Date Taking? Authorizing Provider  albuterol (PROVENTIL HFA;VENTOLIN HFA) 108 (90 BASE) MCG/ACT inhaler Inhale 2 puffs into the lungs every 6 (six) hours as needed for wheezing or shortness of breath.   Yes Historical Provider, MD  atorvastatin (LIPITOR) 10 MG tablet Take 10 mg by mouth daily. 08/31/15  Yes Historical Provider, MD  buPROPion (WELLBUTRIN XL) 300 MG 24 hr tablet Take 300 mg by mouth every morning.    Yes Historical Provider, MD  clonazePAM (KLONOPIN) 2 MG tablet Take 2 mg by mouth 3 (three) times daily.  10/27/15  Yes Historical Provider, MD  diphenhydrAMINE (BENADRYL) 25 MG tablet Take 50 mg by mouth every 6 (six) hours as needed for allergies.   Yes Historical Provider, MD  gabapentin (NEURONTIN) 300 MG capsule Take 300 mg by mouth 3 (three) times daily. 09/19/15  Yes Historical Provider, MD  loratadine (CLARITIN) 10 MG tablet Take 10 mg by mouth every morning.    Yes Historical Provider, MD  omeprazole (PRILOSEC) 20 MG capsule Take 20 mg by mouth daily.   Yes Historical Provider, MD  sertraline (ZOLOFT) 100 MG tablet Take 100 mg by mouth every morning.  Yes Historical Provider, MD  SUBOXONE 8-2 MG FILM Place 1 Film under the tongue 2 (two) times daily. 09/15/15  Yes Historical Provider, MD  albuterol (PROVENTIL) (2.5 MG/3ML) 0.083% nebulizer solution Take 2.5 mg by nebulization every 6 (six) hours as needed for wheezing or shortness of breath.    Historical Provider, MD  feeding supplement (BOOST / RESOURCE BREEZE) LIQD Take 1 Container by mouth 3 (three) times daily between meals. Patient not taking: Reported on 11/04/2015 05/24/15   Kathlen Mody, MD  fluticasone (FLONASE) 50 MCG/ACT nasal spray Place 1-2 sprays into both nostrils daily as needed for allergies or rhinitis.     Historical Provider, MD  folic acid (FOLVITE) 1 MG tablet Take 1 tablet (1 mg total) by mouth daily. Patient not  taking: Reported on 11/04/2015 05/24/15   Kathlen Mody, MD  HYDROcodone-acetaminophen (NORCO) 10-325 MG tablet Take 1 tablet by mouth 3 (three) times daily as needed for moderate pain or severe pain. 11/06/15   Jeralyn Bennett, MD  levofloxacin (LEVAQUIN) 750 MG tablet Take 1 tablet (750 mg total) by mouth daily. 11/06/15   Jeralyn Bennett, MD  Maltodextrin-Xanthan Gum (RESOURCE THICKENUP CLEAR) POWD As needed. Patient not taking: Reported on 11/04/2015 05/24/15   Kathlen Mody, MD  thiamine 100 MG tablet Take 1 tablet (100 mg total) by mouth daily. Patient not taking: Reported on 11/04/2015 05/24/15   Kathlen Mody, MD   BP 110/80 mmHg  Pulse 65  Temp(Src) 97.1 F (36.2 C) (Oral)  Resp 16  Ht  (1.803 m)  Wt 172 lb (78.019 kg)  BMI 24.00 kg/m2  SpO2 99% Physical Exam  Constitutional: He is oriented to person, place, and time.  Patient is well-nourished well-developed. He is moderately to severely ill in appearance. He is alert and answering questions appropriately. He appears fatigued but is not delirious. No respiratory distress at rest.  HENT:  Head: Normocephalic and atraumatic.  Poor dentition, dry mucous memories.  Eyes: EOM are normal.  Cardiovascular: Normal rate, regular rhythm, normal heart sounds and intact distal pulses.   Radial and pedal pulses are intact and 2+.  Pulmonary/Chest: Effort normal and breath sounds normal. No respiratory distress.  Abdominal: Soft. Bowel sounds are normal. He exhibits no distension. There is tenderness.  Suprapubic tenderness without guarding. No appreciable mass.  Musculoskeletal: Normal range of motion. He exhibits no edema or tenderness.  Neurological: He is alert and oriented to person, place, and time. He exhibits normal muscle tone. Coordination normal.  Skin: Skin is warm and dry. There is pallor.  Patient has tattoos. There are no areas suspicious for cellulitis or abscess.  Psychiatric: He has a normal mood and affect.    ED Course   Procedures (including critical care time) CRITICAL CARE Performed by: Arby Barrette   Total critical care time: 45 minutes  Critical care time was exclusive of separately billable procedures and treating other patients.  Critical care was necessary to treat or prevent imminent or life-threatening deterioration.  Critical care was time spent personally by me on the following activities: development of treatment plan with patient and/or surrogate as well as nursing, discussions with consultants, evaluation of patient's response to treatment, examination of patient, obtaining history from patient or surrogate, ordering and performing treatments and interventions, ordering and review of laboratory studies, ordering and review of radiographic studies, pulse oximetry and re-evaluation of patient's condition. Labs Review Labs Reviewed  BASIC METABOLIC PANEL - Abnormal; Notable for the following:    Chloride 98 (*)  Glucose, Bld 112 (*)    BUN 32 (*)    Creatinine, Ser 2.06 (*)    GFR calc non Af Amer 38 (*)    GFR calc Af Amer 44 (*)    All other components within normal limits  CBC WITH DIFFERENTIAL/PLATELET - Abnormal; Notable for the following:    WBC 15.6 (*)    RBC 3.34 (*)    Hemoglobin 10.6 (*)    HCT 30.7 (*)    Neutro Abs 12.3 (*)    All other components within normal limits  URINE RAPID DRUG SCREEN, HOSP PERFORMED - Abnormal; Notable for the following:    Opiates POSITIVE (*)    Benzodiazepines POSITIVE (*)    Amphetamines POSITIVE (*)    All other components within normal limits  CREATININE, SERUM - Abnormal; Notable for the following:    Creatinine, Ser 1.41 (*)    GFR calc non Af Amer 60 (*)    All other components within normal limits  COMPREHENSIVE METABOLIC PANEL - Abnormal; Notable for the following:    Potassium 3.4 (*)    Calcium 7.9 (*)    Total Protein 5.7 (*)    Albumin 2.9 (*)    ALT 15 (*)    All other components within normal limits  CBC -  Abnormal; Notable for the following:    RBC 2.91 (*)    Hemoglobin 9.3 (*)    HCT 27.0 (*)    Platelets 146 (*)    All other components within normal limits  BASIC METABOLIC PANEL - Abnormal; Notable for the following:    CO2 21 (*)    Calcium 8.2 (*)    All other components within normal limits  CBC - Abnormal; Notable for the following:    RBC 2.78 (*)    Hemoglobin 8.9 (*)    HCT 26.5 (*)    All other components within normal limits  CULTURE, BLOOD (ROUTINE X 2)  CULTURE, BLOOD (ROUTINE X 2)  URINE CULTURE  URINE CULTURE  MRSA PCR SCREENING  RESPIRATORY VIRUS PANEL  URINALYSIS, ROUTINE W REFLEX MICROSCOPIC (NOT [facility])  PROCALCITONIN  INFLUENZA PANEL BY PCR (TYPE A & B, H1N1)  LACTIC ACID, PLASMA  LACTIC ACID, PLASMA  I-STAT CG4 LACTIC ACID, ED  I-STAT CG4 LACTIC ACID, ED  I-STAT CG4 LACTIC ACID, ED  I-STAT CG4 LACTIC ACID, ED    Imaging Review No results found. I have personally reviewed and evaluated these images and lab results as part of my medical decision-making.   EKG Interpretation   Date/Time:  Friday November 04 2015 12:29:07 EDT Ventricular Rate:  65 PR Interval:  166 QRS Duration: 123 QT Interval:  440 QTC Calculation: 457 R Axis:   90 Text Interpretation:  Sinus rhythm Atrial premature complex IVCD, consider  atypical RBBB ED PHYSICIAN INTERPRETATION AVAILABLE IN [facility]  Confirmed by TEST, Record ([id number]) on 11/05/2015 9:58:54 AM     Consultation: Hospitalist consult for admission. MDM   Final diagnoses:  Hypotension, unspecified hypotension type  Polysubstance abuse  Sepsis, due to unspecified organism (HCC)  CAP (community acquired pneumonia)   Patient presents with hypotension and leukocytosis. Chest x-ray suggests pneumonia. Patient also reported urinary symptoms of decreased output with pressure sensation. Sepsis protocol was initiated with urinary source based on patient's history and history of present illness. Chest x-ray  however suggest pneumonia. Patient was aggressively rehydrated and responded well to fluid administration. Pressures came up to mid 90s to 100 systolic. Throughout the patient  did maintain palpable peripheral pulses and no advancing respiratory distress. Patient is admitted with sepsis and polysubstance abuse.    Arby Barrette, MD 11/09/15 2125

## 2015-11-04 NOTE — ED Notes (Signed)
In addition to below note, pt reports visual hallucinations x 4 days. No history of hallucinations.

## 2015-11-04 NOTE — Progress Notes (Signed)
Pharmacy Antibiotic Note  Eddie Williams is a 45 y.o. male admitted on 11/04/2015 with sepsis.  Pharmacy has been consulted for vancomycin/levofloxacin/aztreonam dosing. Sepsis likely d/t HCAP (CXR consistent with pneumonia). Code sepsis initiated.  Patient with anaphylaxis to PCN - no cephalosporins given on previous admissions. SCr is elevated at admission  Plan:  Vancomycin 1gm x 1 then 750mg  IV q12h  Monitor renal function  Suggest trough in 2-3 days (looks to have had elevated levels in past)  Aztreonam 2gm IV q8h  Levofloxacin 750mg  IV q24h  Height: 5\' 11"  (180.3 cm) Weight: 172 lb (78.019 kg) IBW/kg (Calculated) : 75.3  Temp (24hrs), Avg:97.6 F (36.4 C), Min:97.2 F (36.2 C), Max:99.4 F (37.4 C)   Recent Labs Lab 11/04/15 1131 11/04/15 1134 11/04/15 1208 11/04/15 1517  WBC  --  15.6*  --   --   CREATININE 2.06*  --   --   --   LATICACIDVEN  --   --  0.85 1.23    Estimated Creatinine Clearance: 49.2 mL/min (by C-G formula based on Cr of 2.06).    Allergies  Allergen Reactions  . Penicillins Anaphylaxis    Has patient had a PCN reaction causing immediate rash, facial/tongue/throat swelling, SOB or lightheadedness with hypotension: Yes Has patient had a PCN reaction causing severe rash involving mucus membranes or skin necrosis: No Has patient had a PCN reaction that required hospitalization: Yes Has patient had a PCN reaction occurring within the last 10 years: No If all of the above answers are "NO", then may proceed with Cephalosporin use.   . Ibuprofen Nausea Only  . Nsaids Nausea And Vomiting  . Erythromycin Rash    Antimicrobials this admission: 3/17 vancomycin >> 3/17 aztreonam >> 3/17 levofloxacin >>  Dose adjustments this admission:   Microbiology results: 3/17 BCx:  3/17 UCx:   3/17 flu PCR:   MRSA PCR:   Thank you for allowing pharmacy to be a part of this patient's care.  Juliette Alcideustin Mckenzey Parcell, PharmD, BCPS.   Pager: [phone] 11/04/2015  7:06 PM

## 2015-11-04 NOTE — Progress Notes (Signed)
Pharmacy Antibiotic Note  Eddie Williams is a 45 y.o. male admitted on 11/04/2015 with sepsis likely from UTI.  Pharmacy has been consulted for levaquin and aztreonam dosing.  Plan:  Levaquin 750mg  IV q48h  Aztreonam 2g in ED, then 1g IV q8h Follow up renal function & cultures De-escalate as appropriate    Temp (24hrs), Avg:98.9 F (37.2 C), Min:98.4 F (36.9 C), Max:99.4 F (37.4 C)   Recent Labs Lab 11/04/15 1131 11/04/15 1208  CREATININE 2.06*  --   LATICACIDVEN  --  0.85    CrCl cannot be calculated (Unknown ideal weight.).   CrCl 46 ml/min/1.2473m2 (normalized)  Allergies  Allergen Reactions  . Penicillins Anaphylaxis    Has patient had a PCN reaction causing immediate rash, facial/tongue/throat swelling, SOB or lightheadedness with hypotension: Yes Has patient had a PCN reaction causing severe rash involving mucus membranes or skin necrosis: No Has patient had a PCN reaction that required hospitalization: Yes Has patient had a PCN reaction occurring within the last 10 years: No If all of the above answers are "NO", then may proceed with Cephalosporin use.   . Ibuprofen Nausea Only  . Nsaids Nausea And Vomiting  . Erythromycin Rash    Antimicrobials this admission: 3/17 >> Levaquin >> 3/17 >> Aztreonam >>  Dose adjustments this admission: ---  Microbiology results: 05/07/15 sputum: E coli,few yeast. Amp & Cipro res  11/04/15 BCx: sent 11/04/15 UCx:  Thank you for allowing pharmacy to be a part of this patient's care.  Loralee PacasErin Carlia Bomkamp, PharmD, BCPS Pager: [phone] 11/04/2015 12:56 PM

## 2015-11-04 NOTE — ED Notes (Signed)
Pt states he has not been able to urinate well since last Thursday (Oct 27, 2015) since he has strained to expell a few drops but not empty, pt states he has balance problems and diff with thoughts

## 2015-11-05 ENCOUNTER — Encounter (HOSPITAL_COMMUNITY): Payer: Self-pay | Admitting: Family Medicine

## 2015-11-05 ENCOUNTER — Inpatient Hospital Stay (HOSPITAL_COMMUNITY): Payer: Medicare Other

## 2015-11-05 DIAGNOSIS — N179 Acute kidney failure, unspecified: Secondary | ICD-10-CM

## 2015-11-05 LAB — CBC
HCT: 27 % — ABNORMAL LOW (ref 39.0–52.0)
Hemoglobin: 9.3 g/dL — ABNORMAL LOW (ref 13.0–17.0)
MCH: 32 pg (ref 26.0–34.0)
MCHC: 34.4 g/dL (ref 30.0–36.0)
MCV: 92.8 fL (ref 78.0–100.0)
PLATELETS: 146 10*3/uL — AB (ref 150–400)
RBC: 2.91 MIL/uL — AB (ref 4.22–5.81)
RDW: 13.2 % (ref 11.5–15.5)
WBC: 8 10*3/uL (ref 4.0–10.5)

## 2015-11-05 LAB — COMPREHENSIVE METABOLIC PANEL
ALT: 15 U/L — AB (ref 17–63)
AST: 21 U/L (ref 15–41)
Albumin: 2.9 g/dL — ABNORMAL LOW (ref 3.5–5.0)
Alkaline Phosphatase: 55 U/L (ref 38–126)
Anion gap: 8 (ref 5–15)
BILIRUBIN TOTAL: 0.5 mg/dL (ref 0.3–1.2)
BUN: 15 mg/dL (ref 6–20)
CHLORIDE: 106 mmol/L (ref 101–111)
CO2: 23 mmol/L (ref 22–32)
CREATININE: 1.15 mg/dL (ref 0.61–1.24)
Calcium: 7.9 mg/dL — ABNORMAL LOW (ref 8.9–10.3)
Glucose, Bld: 91 mg/dL (ref 65–99)
POTASSIUM: 3.4 mmol/L — AB (ref 3.5–5.1)
Sodium: 137 mmol/L (ref 135–145)
TOTAL PROTEIN: 5.7 g/dL — AB (ref 6.5–8.1)

## 2015-11-05 LAB — LACTIC ACID, PLASMA
LACTIC ACID, VENOUS: 1.2 mmol/L (ref 0.5–2.0)
Lactic Acid, Venous: 1.8 mmol/L (ref 0.5–2.0)

## 2015-11-05 LAB — INFLUENZA PANEL BY PCR (TYPE A & B)
H1N1FLUPCR: NOT DETECTED
Influenza A By PCR: NEGATIVE
Influenza B By PCR: NEGATIVE

## 2015-11-05 LAB — URINE CULTURE: Culture: NO GROWTH

## 2015-11-05 LAB — MRSA PCR SCREENING: MRSA by PCR: NEGATIVE

## 2015-11-05 MED ORDER — VANCOMYCIN HCL IN DEXTROSE 1-5 GM/200ML-% IV SOLN
1000.0000 mg | Freq: Two times a day (BID) | INTRAVENOUS | Status: DC
  Administered 2015-11-05 – 2015-11-06 (×2): 1000 mg via INTRAVENOUS
  Filled 2015-11-05 (×2): qty 200

## 2015-11-05 MED ORDER — PANTOPRAZOLE SODIUM 40 MG PO TBEC
40.0000 mg | DELAYED_RELEASE_TABLET | Freq: Every day | ORAL | Status: DC
  Administered 2015-11-05 – 2015-11-06 (×2): 40 mg via ORAL
  Filled 2015-11-05 (×2): qty 1

## 2015-11-05 MED ORDER — GI COCKTAIL ~~LOC~~
30.0000 mL | Freq: Once | ORAL | Status: AC
  Administered 2015-11-05: 30 mL via ORAL
  Filled 2015-11-05: qty 30

## 2015-11-05 MED ORDER — POTASSIUM CHLORIDE CRYS ER 20 MEQ PO TBCR
40.0000 meq | EXTENDED_RELEASE_TABLET | Freq: Once | ORAL | Status: AC
  Administered 2015-11-05: 40 meq via ORAL
  Filled 2015-11-05: qty 2

## 2015-11-05 NOTE — Progress Notes (Signed)
[facility] PROGRESS NOTE  Leanna BattlesJames T Tinkham ZOX:[id number] DOB: 1971-03-12 DOA: 11/04/2015 [facility]  Assessment/Plan: 1. Severe sepsis -Evidenced by blood pressure 70/46, white count of 15,600, acute encephalopathy, acute kidney injury, with source of infection likely community-acquired pneumonia. -Initial workup included a chest x-ray that showed opacities at at bases. -He was placed on sepsis protocol, administered aggressive IV fluid resuscitation overnight, started on empiric IV antibiotic therapy. -By 11/05/2015 patient showing clinical improvement.  -Blood cultures pending -White count trending down to 8000 from 15,600 on admission -Continue supportive care in the step down unit  2.  Community-acquired pneumonia. -He reported subjective fevers, chills, pleuritic type chest pain. Chest x-ray revealed bibasilar opacities consistent with pneumonia. -Influenza swab is pending -Pending repeat two-view chest x-ray this morning -Plan to continue broad-spectrum empiric antibiotic therapy with vancomycin, aztreonam, Levaquin, with the plan of narrowing his antimicrobial spectrum in the next 24 hours.  3.  Acute kidney injury. -Suspect related to severe sepsis/hypovolemia. Initial lab work in the emergency department revealed a creatinine of 2.06 with BUN of 32. This improved to 1.15 and BUN of 15 on the following morning after receiving aggressive IV fluid resuscitation.  4.  History of hypertension. -He had been on lisinopril, atenolol, chlorothiazide for history of hypertension. These agents were discontinued due to hypotension.  5.  Acute hypoxemic respiratory failure. -Evidenced by an O2 sat of 80% in the emergency department. Likely related to community acquire pneumonia. Showing improvement from a respiratory standpoint. Continue supplemental oxygen as needed  Code Status: Full code Family Communication: Family not present Disposition Plan: Continue close  monitoring  unit   Antibiotics:  Levaquin  Vancomycin  Azithromycin  HPI/Subjective: Mr. Eddie Williams is a 45 year old gentleman with past medical history of polysubstance abuse, hypertension, presented to the emergency department on 11/04/2015 with mental status changes, found to be septic. This was evident by hypotension having blood pressure of 74/46, white count of 15,600, acute kidney injury, acute encephalopathy. Suspect source of infection to be community-acquired pneumonia as a chest x-ray revealed bibasilar opacities. He was started on broad-spectrum IV antimicrobial therapy and admitted to the step down unit.  Objective: Filed Vitals:   11/05/15 0600 11/05/15 0700  BP: 106/64 99/54  Pulse: 69 66  Temp: 99.3 F (37.4 C) 99.5 F (37.5 C)  Resp: 16 19    Intake/Output Summary (Last 24 hours) at 11/05/15 0807 Last data filed at 11/05/15 [id number]  Gross per 24 hour  Intake   2045 ml  Output    700 ml  Net   1345 ml   Filed Weights   11/04/15 1323  Weight: 78.019 kg (172 lb)    Exam:   General:  Looks better on this mornings evaluation awake and alert mentating well  Cardiovascular: Regular rate rhythm normal S1-S2 no murmurs rubs or gallops  Respiratory: Normal respiratory effort, lungs are clear to auscultation bilaterally  Abdomen: Soft nontender nondistended  Musculoskeletal: No edema  Data Reviewed: Basic Metabolic Panel:  Recent Labs Lab 11/04/15 1131 11/04/15 1818 11/05/15 0355  NA 137  --  137  K 3.7  --  3.4*  CL 98*  --  106  CO2 26  --  23  GLUCOSE 112*  --  91  BUN 32*  --  15  CREATININE 2.06* 1.41* 1.15  CALCIUM 9.1  --  7.9*   Liver Function Tests:  Recent Labs Lab 11/05/15 0355  AST 21  ALT 15*  ALKPHOS 55  BILITOT  0.5  PROT 5.7*  ALBUMIN 2.9*   No results for input(s): LIPASE, AMYLASE in the last 168 hours. No results for input(s): AMMONIA in the last 168 hours. CBC:  Recent Labs Lab 11/04/15 1134 11/05/15 0355  WBC  15.6* 8.0  NEUTROABS 12.3*  --   HGB 10.6* 9.3*  HCT 30.7* 27.0*  MCV 91.9 92.8  PLT 219 146*   Cardiac Enzymes: No results for input(s): CKTOTAL, CKMB, CKMBINDEX, TROPONINI in the last 168 hours. BNP (last 3 results) No results for input(s): BNP in the last 8760 hours.  ProBNP (last 3 results) No results for input(s): PROBNP in the last 8760 hours.  CBG: No results for input(s): GLUCAP in the last 168 hours.  Recent Results (from the past 240 hour(s))  Blood Culture (routine x 2)     Status: None (Preliminary result)   Collection Time: 11/04/15 11:30 AM  Result Value Ref Range Status   Specimen Description BLOOD LEFT ANTECUBITAL  Final   Special Requests   Final    BOTTLES DRAWN AEROBIC AND ANAEROBIC 5CC Performed at [facility]    Culture PENDING  Incomplete   Report Status PENDING  Incomplete  Blood Culture (routine x 2)     Status: None (Preliminary result)   Collection Time: 11/04/15 11:47 AM  Result Value Ref Range Status   Specimen Description BLOOD RIGHT HAND  Final   Special Requests   Final    BOTTLES DRAWN AEROBIC AND ANAEROBIC 5CC Performed at [facility]    Culture PENDING  Incomplete   Report Status PENDING  Incomplete  MRSA PCR Screening     Status: None   Collection Time: 11/04/15 11:48 PM  Result Value Ref Range Status   MRSA by PCR NEGATIVE NEGATIVE Final    Comment:        The GeneXpert MRSA Assay (FDA approved for NASAL specimens only), is one component of a comprehensive MRSA colonization surveillance program. It is not intended to diagnose MRSA infection nor to guide or monitor treatment for MRSA infections.      Studies: Dg Chest 1 View  11/04/2015  CLINICAL DATA:  Sepsis EXAM: CHEST 1 VIEW COMPARISON:  09/21/2015 FINDINGS: Normal heart size. Hazy and central bibasilar airspace disease. Upper lungs clear. No pneumothorax. No pleural effusion. IMPRESSION: Hazy bibasilar airspace disease. Followup PA and lateral chest  X-ray is recommended in 3-4 weeks following trial of antibiotic therapy to ensure resolution and exclude underlying malignancy. Electronically Signed   By: Jolaine Click M.D.   On: 11/04/2015 13:18    Scheduled Meds: . aztreonam  2 g Intravenous 3 times per day  . buPROPion  300 mg Oral q morning - 10a  . enoxaparin (LOVENOX) injection  40 mg Subcutaneous QHS  . levofloxacin (LEVAQUIN) IV  750 mg Intravenous Q24H  . nicotine  21 mg Transdermal Daily  . sertraline  100 mg Oral q morning - 10a  . sodium chloride flush  3 mL Intravenous Q12H  . sodium chloride flush  3 mL Intravenous Q12H  . vancomycin  1,000 mg Intravenous Q12H   Continuous Infusions: . sodium chloride 150 mL/hr at 11/04/15 1910    Principal Problem:   Sepsis (HCC) Active Problems:   Acute renal failure (HCC)   CAP (community acquired pneumonia)   Polysubstance abuse   Acute encephalopathy   Acute hypoxemic respiratory failure (HCC)    Time spent: 40 min    Jeralyn Bennett  [facility] Pager [phone]. If 7PM-7AM,  please contact night-coverage at [email or web address], password [facility] 11/05/2015, 8:07 AM  LOS: 1 day

## 2015-11-05 NOTE — Progress Notes (Signed)
Pharmacy Antibiotic Note  Eddie Williams is a 45 y.o. male admitted on 11/04/2015 with sepsis.  Pharmacy has been consulted for vancomycin/levofloxacin/aztreonam dosing. Sepsis likely d/t HCAP (CXR consistent with pneumonia). Code sepsis initiated.  Patient with anaphylaxis to PCN - no cephalosporins given on previous admissions. AKI on admission which has resolved with hydration.   Slightly worse multifocal PNA per CXR on 3/18.  Plan:  Increase Vancomycin 1gm IV q12h  Monitor renal function  Suggest trough in 2-3 days (looks to have had elevated levels in past, but Scr also elevated at this time)  Continue Aztreonam 2gm IV q8h  Continue Levofloxacin 750mg  IV q24h  De-escalate antibiotics once clinically appropriate- hopefully within next 24 hrs  Height: 5\' 11"  (180.3 cm) Weight: 172 lb (78.019 kg) IBW/kg (Calculated) : 75.3  Temp (24hrs), Avg:98.2 F (36.8 C), Min:97.2 F (36.2 C), Max:99.5 F (37.5 C)   Recent Labs Lab 11/04/15 1131 11/04/15 1134 11/04/15 1208 11/04/15 1517 11/04/15 1818 11/05/15 0355 11/05/15 0656  WBC  --  15.6*  --   --   --  8.0  --   CREATININE 2.06*  --   --   --  1.41* 1.15  --   LATICACIDVEN  --   --  0.85 1.23  --  1.8 1.2    Estimated Creatinine Clearance: 88.2 mL/min (by C-G formula based on Cr of 1.15).    Allergies  Allergen Reactions  . Penicillins Anaphylaxis    Has patient had a PCN reaction causing immediate rash, facial/tongue/throat swelling, SOB or lightheadedness with hypotension: Yes Has patient had a PCN reaction causing severe rash involving mucus membranes or skin necrosis: No Has patient had a PCN reaction that required hospitalization: Yes Has patient had a PCN reaction occurring within the last 10 years: No If all of the above answers are "NO", then may proceed with Cephalosporin use.   . Ibuprofen Nausea Only  . Nsaids Nausea And Vomiting  . Erythromycin Rash    Antimicrobials this admission: 3/17 vancomycin  >> 3/17 aztreonam >> 3/17 levofloxacin >>  Dose adjustments this admission:   Microbiology results: 3/17 BCx:  3/17 UCx:  3/17 flu PCR:  3/17 MRSA PCR: neg  Thank you for allowing pharmacy to be a part of this patient's care.  Junita PushMichelle Ileah Falkenstein, PharmD, BCPS Pager: [phone] 11/05/2015 8:01 AM

## 2015-11-06 LAB — BASIC METABOLIC PANEL
ANION GAP: 9 (ref 5–15)
BUN: 6 mg/dL (ref 6–20)
CHLORIDE: 107 mmol/L (ref 101–111)
CO2: 21 mmol/L — ABNORMAL LOW (ref 22–32)
Calcium: 8.2 mg/dL — ABNORMAL LOW (ref 8.9–10.3)
Creatinine, Ser: 0.78 mg/dL (ref 0.61–1.24)
Glucose, Bld: 97 mg/dL (ref 65–99)
POTASSIUM: 3.6 mmol/L (ref 3.5–5.1)
SODIUM: 137 mmol/L (ref 135–145)

## 2015-11-06 LAB — URINE CULTURE: CULTURE: NO GROWTH

## 2015-11-06 LAB — CBC
HCT: 26.5 % — ABNORMAL LOW (ref 39.0–52.0)
Hemoglobin: 8.9 g/dL — ABNORMAL LOW (ref 13.0–17.0)
MCH: 32 pg (ref 26.0–34.0)
MCHC: 33.6 g/dL (ref 30.0–36.0)
MCV: 95.3 fL (ref 78.0–100.0)
PLATELETS: 164 10*3/uL (ref 150–400)
RBC: 2.78 MIL/uL — ABNORMAL LOW (ref 4.22–5.81)
RDW: 13.2 % (ref 11.5–15.5)
WBC: 4.7 10*3/uL (ref 4.0–10.5)

## 2015-11-06 MED ORDER — HYDROCODONE-ACETAMINOPHEN 10-325 MG PO TABS: 1.0000 | ORAL_TABLET | Freq: Three times a day (TID) | ORAL | Status: AC | PRN

## 2015-11-06 MED ORDER — LEVOFLOXACIN 750 MG PO TABS: 750.0000 mg | ORAL_TABLET | Freq: Every day | ORAL | Status: AC

## 2015-11-06 NOTE — Discharge Summary (Signed)
Physician Discharge Summary  Eddie Williams NFA:[id number] DOB: [birth year] DOA: 11/04/2015  [facility]  Admit date: 11/04/2015 Discharge date: 11/06/2015  Time spent: 35 minutes  Recommendations for Outpatient Follow-up:  1. Please follow up on blood pressures, he presented with hypotension and sepsis for which enzyme because of agents were held. 2. He was discharged on Levaquin 750 mg by mouth daily 4 days 3. Follow-up with CBC and BMP   Discharge Diagnoses:  Principal Problem:   Sepsis (HCC) Active Problems:   Acute renal failure (HCC)   CAP (community acquired pneumonia)   Polysubstance abuse   Acute encephalopathy   Acute hypoxemic respiratory failure (HCC)   Discharge Condition: stable/improved  Diet recommendation: Regular diet  Filed Weights   11/04/15 1323  Weight: 78.019 kg (172 lb)    History of present illness:  Eddie Williams is a 45 y.o. male with a past medical history of polysubstance abuse, tobacco abuse, hypertension, evident history of septic shock in setting of healthcare associated pneumonia hospitalized from 05/07/2015 through 05/24/2015, presented to the [facility] department with complaints of mental status changes. He states feeling ill over the past several days having subjective fevers, chills, cough was associated with scant sputum production, retrosternal chest pain precipitated by cough and deep inspiration, generalized weakness, malaise. He denies recent travels or sick contacts The emergency department he was found to be hypotensive having a blood pressure of 74/46. Chest x-ray revealed hazy bibasilar airspace disease concerning for pneumonia and was started on broad-spectrum IV antimicrobial therapy in the emergency department with IV vancomycin, aztreonam and Levaquin. Code sepsis was called.  Hospital Course:  Mr. Eddie Williams is a 45 year old gentleman with past medical history of polysubstance abuse, hypertension, presented to the  emergency department on 11/04/2015 with mental status changes, found to be septic. This was evident by hypotension having blood pressure of 74/46, white count of 15,600, acute kidney injury, acute encephalopathy. Suspect source of infection to be community-acquired pneumonia as a chest x-ray revealed bibasilar opacities. He was started on broad-spectrum IV antimicrobial therapy and admitted to the step down unit.  1. Severe sepsis -Evidenced by blood pressure 70/46, white count of 15,600, acute encephalopathy, acute kidney injury, with source of infection likely community-acquired pneumonia. -Initial workup included a chest x-ray that showed opacities at at bases. -He was placed on sepsis protocol, administered aggressive IV fluid resuscitation overnight, started on empiric IV antibiotic therapy. -By 11/05/2015 patient showing clinical improvement.  -Blood cultures pending  2. Community-acquired pneumonia. -He reported subjective fevers, chills, pleuritic type chest pain. Chest x-ray revealed bibasilar opacities consistent with pneumonia. -Influenza swab is pending -He was treated with broad-spectrum empiric antibiotic therapy with vancomycin, aztreonam, Levaquin -Blood cultures drawn on 11/04/2015 showing no growth 2 sets. -He showed significant clinical improvement and on 11/06/2015 reported feeling well enough to go home. He was discharged on Levaquin 750 mg by mouth daily.  3. Acute kidney injury. -Suspect related to severe sepsis/hypovolemia. Initial lab work in the emergency department revealed a creatinine of 2.06 with BUN of 32. This improved to 1.15 and BUN of 15 on the following morning after receiving aggressive IV fluid resuscitation.  4. History of hypertension. -He had been on lisinopril, atenolol, chlorothiazide for history of hypertension. These agents were discontinued due to hypotension. -Antihypertensives held on discharge, please follow-up on his blood pressures.  5.  Acute hypoxemic respiratory failure. -Evidenced by an O2 sat of 80% in the emergency department. Likely related to community acquire pneumonia.  Showing improvement from a respiratory standpoint. Continue supplemental oxygen as needed   Discharge Exam: Filed Vitals:   11/05/15 2258 11/06/15 0552  BP: 110/70 110/80  Pulse: 72 65  Temp: 98 F (36.7 C) 97.1 F (36.2 C)  Resp: 16 16    General: Nontoxic-appearing states feeling much better asking to go on today. Cardiovascular: Regular rate and rhythm normal S1-S2 no murmurs rubs or gallops Respiratory: Normal respiratory effort Abdomen: Soft nontender nondistended  Discharge Instructions   Discharge Instructions    Call MD for:  difficulty breathing, headache or visual disturbances    Complete by:  As directed      Call MD for:  extreme fatigue    Complete by:  As directed      Call MD for:  hives    Complete by:  As directed      Call MD for:  persistant dizziness or light-headedness    Complete by:  As directed      Call MD for:  persistant nausea and vomiting    Complete by:  As directed      Call MD for:  redness, tenderness, or signs of infection (pain, swelling, redness, odor or green/yellow discharge around incision site)    Complete by:  As directed      Call MD for:  severe uncontrolled pain    Complete by:  As directed      Call MD for:  temperature >100.4    Complete by:  As directed      Call MD for:    Complete by:  As directed      Diet - low sodium heart healthy    Complete by:  As directed      Diet - low sodium heart healthy    Complete by:  As directed      Increase activity slowly    Complete by:  As directed      Increase activity slowly    Complete by:  As directed           Current Discharge Medication List    START taking these medications   Details  levofloxacin (LEVAQUIN) 750 MG tablet Take 1 tablet (750 mg total) by mouth daily. Qty: 5 tablet, Refills: 0      CONTINUE these  medications which have CHANGED   Details  HYDROcodone-acetaminophen (NORCO) 10-325 MG tablet Take 1 tablet by mouth 3 (three) times daily as needed for moderate pain or severe pain. Qty: 10 tablet, Refills: 0      CONTINUE these medications which have NOT CHANGED   Details  albuterol (PROVENTIL HFA;VENTOLIN HFA) 108 (90 BASE) MCG/ACT inhaler Inhale 2 puffs into the lungs every 6 (six) hours as needed for wheezing or shortness of breath.    atorvastatin (LIPITOR) 10 MG tablet Take 10 mg by mouth daily. Refills: 3    buPROPion (WELLBUTRIN XL) 300 MG 24 hr tablet Take 300 mg by mouth every morning.     clonazePAM (KLONOPIN) 2 MG tablet Take 2 mg by mouth 3 (three) times daily.  Refills: 1    diphenhydrAMINE (BENADRYL) 25 MG tablet Take 50 mg by mouth every 6 (six) hours as needed for allergies.    gabapentin (NEURONTIN) 300 MG capsule Take 300 mg by mouth 3 (three) times daily. Refills: 4    loratadine (CLARITIN) 10 MG tablet Take 10 mg by mouth every morning.     omeprazole (PRILOSEC) 20 MG capsule Take 20 mg by mouth daily.  sertraline (ZOLOFT) 100 MG tablet Take 100 mg by mouth every morning.     SUBOXONE 8-2 MG FILM Place 1 Film under the tongue 2 (two) times daily. Refills: 0    albuterol (PROVENTIL) (2.5 MG/3ML) 0.083% nebulizer solution Take 2.5 mg by nebulization every 6 (six) hours as needed for wheezing or shortness of breath.    feeding supplement (BOOST / RESOURCE BREEZE) LIQD Take 1 Container by mouth 3 (three) times daily between meals. Refills: 0    fluticasone (FLONASE) 50 MCG/ACT nasal spray Place 1-2 sprays into both nostrils daily as needed for allergies or rhinitis.     folic acid (FOLVITE) 1 MG tablet Take 1 tablet (1 mg total) by mouth daily.    Maltodextrin-Xanthan Gum (RESOURCE THICKENUP CLEAR) POWD As needed.    thiamine 100 MG tablet Take 1 tablet (100 mg total) by mouth daily.      STOP taking these medications     acetaminophen (TYLENOL) 500  MG tablet      atenolol-chlorthalidone (TENORETIC) 100-25 MG per tablet      lisinopril-hydrochlorothiazide (PRINZIDE,ZESTORETIC) 10-12.5 MG tablet      nicotine (NICODERM CQ - DOSED IN MG/24 HOURS) 21 mg/24hr patch      traMADol (ULTRAM) 50 MG tablet        Allergies  Allergen Reactions  . Penicillins Anaphylaxis    Has patient had a PCN reaction causing immediate rash, facial/tongue/throat swelling, SOB or lightheadedness with hypotension: Yes Has patient had a PCN reaction causing severe rash involving mucus membranes or skin necrosis: No Has patient had a PCN reaction that required hospitalization: Yes Has patient had a PCN reaction occurring within the last 10 years: No If all of the above answers are "NO", then may proceed with Cephalosporin use.   . Ibuprofen Nausea Only  . Nsaids Nausea And Vomiting  . Erythromycin Rash      The results of significant diagnostics from this hospitalization (including imaging, microbiology, ancillary and laboratory) are listed below for reference.    Significant Diagnostic Studies: Dg Chest 1 View  11/04/2015  CLINICAL DATA:  Sepsis EXAM: CHEST 1 VIEW COMPARISON:  09/21/2015 FINDINGS: Normal heart size. Hazy and central bibasilar airspace disease. Upper lungs clear. No pneumothorax. No pleural effusion. IMPRESSION: Hazy bibasilar airspace disease. Followup PA and lateral chest X-ray is recommended in 3-4 weeks following trial of antibiotic therapy to ensure resolution and exclude underlying malignancy. Electronically Signed   By: Jolaine Click M.D.   On: 11/04/2015 13:18   Dg Chest 2 View  11/05/2015  CLINICAL DATA:  Community-acquired pneumonia. EXAM: CHEST  2 VIEW COMPARISON:  Chest radiograph from one day prior. FINDINGS: Stable cardiomediastinal silhouette with normal heart size. No pneumothorax. No pleural effusion. Emphysema and hyperinflated lungs. Mild patchy opacities in the mid to lower lungs bilaterally appears slightly worsened.  IMPRESSION: 1. Slight worsening of mild patchy opacities in the mid to lower lungs bilaterally, favor multifocal pneumonia. Recommend follow-up chest imaging to resolution. 2. Emphysema and hyperinflated lungs, suggesting COPD. Electronically Signed   By: Delbert Phenix M.D.   On: 11/05/2015 11:43    Microbiology: Recent Results (from the past 240 hour(s))  Blood Culture (routine x 2)     Status: None (Preliminary result)   Collection Time: 11/04/15 11:30 AM  Result Value Ref Range Status   Specimen Description BLOOD LEFT ANTECUBITAL  Final   Special Requests BOTTLES DRAWN AEROBIC AND ANAEROBIC 5CC  Final   Culture   Final    NO  GROWTH 1 DAY [facility]    Report Status PENDING  Incomplete  Blood Culture (routine x 2)     Status: None (Preliminary result)   Collection Time: 11/04/15 11:47 AM  Result Value Ref Range Status   Specimen Description BLOOD RIGHT HAND  Final   Special Requests BOTTLES DRAWN AEROBIC AND ANAEROBIC 5CC  Final   Culture   Final    NO GROWTH 1 DAY Performed at [facility]    Report Status PENDING  Incomplete  Urine culture     Status: None   Collection Time: 11/04/15  1:26 PM  Result Value Ref Range Status   Specimen Description URINE, CATHETERIZED  Final   Special Requests NONE  Final   Culture   Final    NO GROWTH 1 DAY Performed at [facility]    Report Status 11/05/2015 FINAL  Final  MRSA PCR Screening     Status: None   Collection Time: 11/04/15 11:48 PM  Result Value Ref Range Status   MRSA by PCR NEGATIVE NEGATIVE Final    Comment:        The GeneXpert MRSA Assay (FDA approved for NASAL specimens only), is one component of a comprehensive MRSA colonization surveillance program. It is not intended to diagnose MRSA infection nor to guide or monitor treatment for MRSA infections.      Labs: Basic Metabolic Panel:  Recent Labs Lab 11/04/15 1131 11/04/15 1818 11/05/15 0355  NA 137  --  137  K 3.7   --  3.4*  CL 98*  --  106  CO2 26  --  23  GLUCOSE 112*  --  91  BUN 32*  --  15  CREATININE 2.06* 1.41* 1.15  CALCIUM 9.1  --  7.9*   Liver Function Tests:  Recent Labs Lab 11/05/15 0355  AST 21  ALT 15*  ALKPHOS 55  BILITOT 0.5  PROT 5.7*  ALBUMIN 2.9*   No results for input(s): LIPASE, AMYLASE in the last 168 hours. No results for input(s): AMMONIA in the last 168 hours. CBC:  Recent Labs Lab 11/04/15 1134 11/05/15 0355 11/06/15 0606  WBC 15.6* 8.0 4.7  NEUTROABS 12.3*  --   --   HGB 10.6* 9.3* 8.9*  HCT 30.7* 27.0* 26.5*  MCV 91.9 92.8 95.3  PLT 219 146* 164   Cardiac Enzymes: No results for input(s): CKTOTAL, CKMB, CKMBINDEX, TROPONINI in the last 168 hours. BNP: BNP (last 3 results) No results for input(s): BNP in the last 8760 hours.  ProBNP (last 3 results) No results for input(s): PROBNP in the last 8760 hours.  CBG: No results for input(s): GLUCAP in the last 168 hours.     Signed:  Jeralyn BennettZAMORA, Lynnell Fiumara MD.  [facility] 11/06/2015, 7:03 AM

## 2015-11-06 NOTE — Progress Notes (Signed)
Went over all discharge information with patient.  All questions answered.  Prescriptions and discharge summary given.  Instructed patient to call Rn when ride arrives.

## 2015-11-07 LAB — RESPIRATORY VIRUS PANEL
ADENOVIRUS: NEGATIVE
INFLUENZA B 1: NEGATIVE
Influenza A: NEGATIVE
METAPNEUMOVIRUS: NEGATIVE
PARAINFLUENZA 1 A: NEGATIVE
PARAINFLUENZA 2 A: NEGATIVE
Parainfluenza 3: NEGATIVE
Respiratory Syncytial Virus A: NEGATIVE
Respiratory Syncytial Virus B: NEGATIVE
Rhinovirus: NEGATIVE

## 2015-11-09 LAB — CULTURE, BLOOD (ROUTINE X 2)
Culture: NO GROWTH
Culture: NO GROWTH

## 2015-12-28 NOTE — Progress Notes (Signed)
This encounter was created in error - please disregard.

## 2016-04-13 IMAGING — DX DG CHEST 1V
1 series · 1 of 1 positions shown · non-contrast
Comparison: 09/21/2015

CLINICAL DATA: Sepsis

EXAM:
CHEST 1 VIEW

[chest ap]
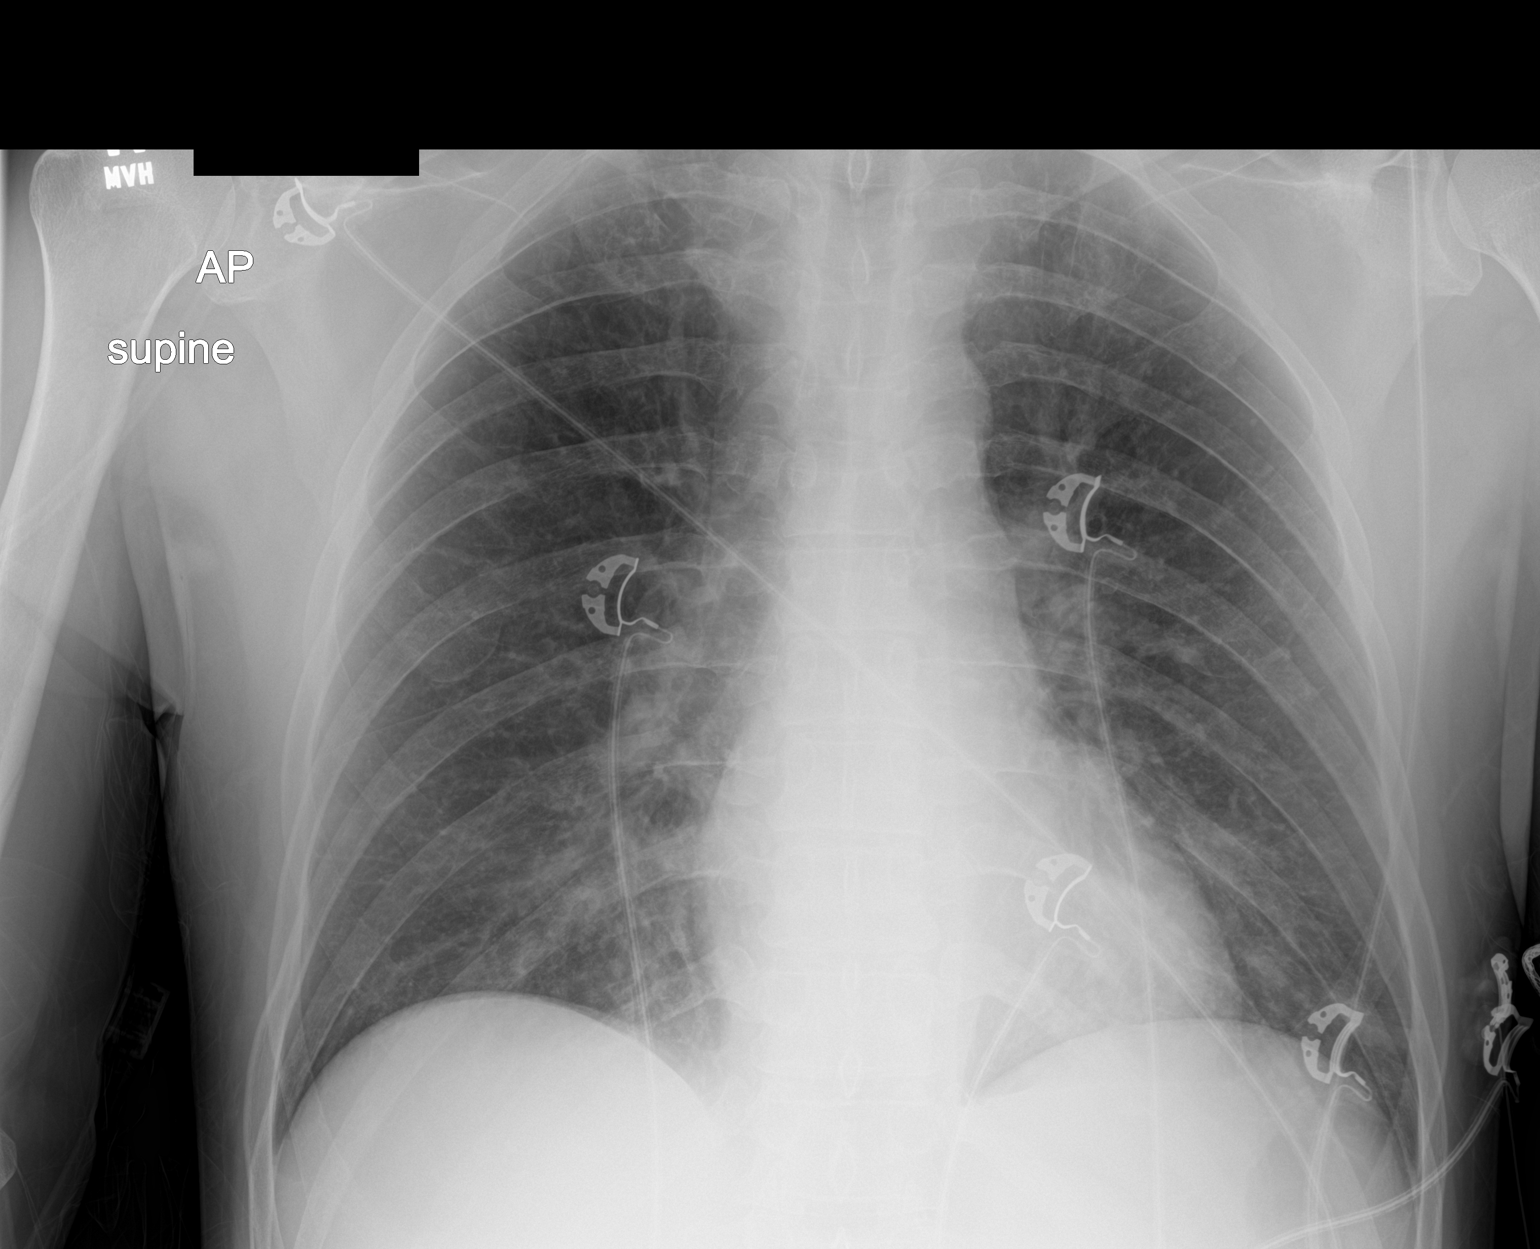

[1 of 1 positions shown; findings below may reference images not displayed]

FINDINGS: Normal heart size. Hazy and central bibasilar airspace disease.
Upper lungs clear. No pneumothorax. No pleural effusion.
IMPRESSION: Hazy bibasilar airspace disease. Followup PA and lateral chest X-ray
is recommended in 3-4 weeks following trial of antibiotic therapy to
ensure resolution and exclude underlying malignancy.
